# Patient Record
Sex: Female | Born: 1995 | Race: Black or African American | Hispanic: No | Marital: Single | Smoking: Never smoker
Health system: Southern US, Community
[De-identification: ages and names within clinical notes are randomized; demographics above are authoritative.]

## PROBLEM LIST (undated history)

## (undated) DIAGNOSIS — N921 Excessive and frequent menstruation with irregular cycle: Secondary | ICD-10-CM

## (undated) DIAGNOSIS — N83209 Unspecified ovarian cyst, unspecified side: Secondary | ICD-10-CM

## (undated) DIAGNOSIS — R0683 Snoring: Secondary | ICD-10-CM

## (undated) DIAGNOSIS — D509 Iron deficiency anemia, unspecified: Secondary | ICD-10-CM

## (undated) DIAGNOSIS — E669 Obesity, unspecified: Secondary | ICD-10-CM

## (undated) HISTORY — DX: Iron deficiency anemia, unspecified: D50.9

## (undated) HISTORY — DX: Unspecified ovarian cyst, unspecified side: N83.209

## (undated) HISTORY — PX: OTHER SURGICAL HISTORY: SHX169

## (undated) HISTORY — DX: Snoring: R06.83

---

## 2015-04-16 ENCOUNTER — Encounter: Payer: Self-pay | Admitting: Medical Oncology

## 2015-04-16 ENCOUNTER — Emergency Department
Admission: EM | Admit: 2015-04-16 | Discharge: 2015-04-16 | Disposition: A | Discharge status: Home / Self Care | Payer: Medicaid Other | Attending: Emergency Medicine | Admitting: Emergency Medicine

## 2015-04-16 ENCOUNTER — Emergency Department: Payer: Medicaid Other

## 2015-04-16 DIAGNOSIS — N939 Abnormal uterine and vaginal bleeding, unspecified: Secondary | ICD-10-CM | POA: Diagnosis not present

## 2015-04-16 DIAGNOSIS — Z3202 Encounter for pregnancy test, result negative: Secondary | ICD-10-CM | POA: Diagnosis not present

## 2015-04-16 DIAGNOSIS — R000 Tachycardia, unspecified: Secondary | ICD-10-CM | POA: Insufficient documentation

## 2015-04-16 LAB — BASIC METABOLIC PANEL
ANION GAP: 6 (ref 5–15)
BUN: 10 mg/dL (ref 6–20)
CALCIUM: 9 mg/dL (ref 8.9–10.3)
CHLORIDE: 105 mmol/L (ref 101–111)
CO2: 27 mmol/L (ref 22–32)
CREATININE: 0.84 mg/dL (ref 0.44–1.00)
GFR calc Af Amer: >60 mL/min (ref >60)
Glucose, Bld: 85 mg/dL (ref 65–99)
Potassium: 3.8 mmol/L (ref 3.5–5.1)
SODIUM: 138 mmol/L (ref 135–145)

## 2015-04-16 LAB — CBC WITH DIFFERENTIAL/PLATELET
BASOS ABS: 0 10*3/uL (ref 0–0.1)
Basophils Relative: 0 %
EOS PCT: 2 %
Eosinophils Absolute: 0.2 10*3/uL (ref 0–0.7)
HCT: 32.2 % — ABNORMAL LOW (ref 35.0–47.0)
Hemoglobin: 10.1 g/dL — ABNORMAL LOW (ref 12.0–16.0)
Lymphocytes Relative: 25 %
Lymphs Abs: 2.9 10*3/uL (ref 1.0–3.6)
MCH: 28.3 pg (ref 26.0–34.0)
MCHC: 31.5 g/dL — AB (ref 32.0–36.0)
MCV: 89.6 fL (ref 80.0–100.0)
MONOS PCT: 6 %
Monocytes Absolute: 0.7 10*3/uL (ref 0.2–0.9)
NEUTROS ABS: 7.5 10*3/uL — AB (ref 1.4–6.5)
NEUTROS PCT: 67 %
Platelets: 310 10*3/uL (ref 150–440)
RBC: 3.59 MIL/uL — ABNORMAL LOW (ref 3.80–5.20)
RDW: 17.3 % — ABNORMAL HIGH (ref 11.5–14.5)
WBC: 11.3 10*3/uL — ABNORMAL HIGH (ref 3.6–11.0)

## 2015-04-16 LAB — CBC
HCT: 31.3 % — ABNORMAL LOW (ref 35.0–47.0)
Hemoglobin: 10.4 g/dL — ABNORMAL LOW (ref 12.0–16.0)
MCH: 29.5 pg (ref 26.0–34.0)
MCHC: 33.3 g/dL (ref 32.0–36.0)
MCV: 88.6 fL (ref 80.0–100.0)
Platelets: 307 10*3/uL (ref 150–440)
RBC: 3.53 MIL/uL — AB (ref 3.80–5.20)
RDW: 17.1 % — AB (ref 11.5–14.5)
WBC: 9.4 10*3/uL (ref 3.6–11.0)

## 2015-04-16 LAB — CHLAMYDIA/NGC RT PCR (ARMC ONLY)
Chlamydia Tr: NOT DETECTED
N gonorrhoeae: NOT DETECTED

## 2015-04-16 LAB — POCT PREGNANCY, URINE: Preg Test, Ur: NEGATIVE

## 2015-04-16 LAB — WET PREP, GENITAL
CLUE CELLS WET PREP: NONE SEEN
TRICH WET PREP: NONE SEEN
WBC WET PREP: NONE SEEN
YEAST WET PREP: NONE SEEN

## 2015-04-16 MED ORDER — SODIUM CHLORIDE 0.9 % IV BOLUS (SEPSIS)
1000.0000 mL | Freq: Once | INTRAVENOUS | Status: AC
  Administered 2015-04-16: 1000 mL via INTRAVENOUS

## 2015-04-16 MED ORDER — NORETHINDRONE ACETATE 5 MG PO TABS: 10.0000 mg | ORAL_TABLET | Freq: Four times a day (QID) | ORAL | Status: DC

## 2015-04-16 MED ORDER — FERROUS FUMARATE 90 MG PO TABS: 90.0000 mg | ORAL_TABLET | Freq: Every day | ORAL | Status: DC

## 2015-04-16 NOTE — ED Notes (Signed)
Pt assisted into position to prep for pelvic exam. When patient pulled underwear down moderate amount of bleeding noted with pt blood dripping down patient's legs and onto the floor. Nursing staff assisted patient with getting cleaned up. MD notified.

## 2015-04-16 NOTE — ED Provider Notes (Signed)
[facility] Emergency Department Provider Note    ____________________________________________  Time seen: 1800  I have reviewed the triage vital signs and the nursing notes.   HISTORY  Chief Complaint Vaginal Bleeding   History limited by: Not Limited   HPI Julie Mcclain is a 19 y.o. female who presents to the emergency department today because of concerns for abnormal vaginal bleeding. The patient states that she has been bleeding for roughly 2 weeks. She states that normally she gets her period monthly in the last 4-5 days. She has never had problems with prolonged bleeding in the past. She states she has noticed that she has been bleeding some blood clots. Addition she has had some headache however denies any chest pain shortness of breath dizziness. She denies any history of bleeding problems in the past. She denies any pain. Denies any fevers.   History reviewed. No pertinent past medical history.  There are no active problems to display for this patient.   History reviewed. No pertinent past surgical history.  No current outpatient prescriptions on file.  Allergies Review of patient's allergies indicates no known allergies.  No family history on file.  Social History History  Substance Use Topics  . Smoking status: Never Smoker   . Smokeless tobacco: Not on file  . Alcohol Use: No    Review of Systems  Constitutional: Negative for fever. Cardiovascular: Negative for chest pain. Respiratory: Negative for shortness of breath. Gastrointestinal: Negative for abdominal pain, vomiting and diarrhea. Genitourinary: Negative for dysuria. Musculoskeletal: Negative for back pain. Skin: Negative for rash. Neurological: Negative for headaches, focal weakness or numbness.  10-point ROS otherwise negative.  ____________________________________________   PHYSICAL EXAM:  VITAL SIGNS: ED Triage Vitals  Enc Vitals Group     BP 04/16/15  1632 131/65 mmHg     Pulse Rate 04/16/15 1632 110     Resp 04/16/15 1632 20     Temp 04/16/15 1632 98.3 F (36.8 C)     Temp Source 04/16/15 1632 Oral     SpO2 04/16/15 1632 99 %     Weight 04/16/15 1632 250 lb (113.399 kg)     Height 04/16/15 1632 5\' 3"  (1.6 m)     Head Cir --      Peak Flow --      Pain Score --      Pain Loc --      Pain Edu? --      Excl. in GC? --      Constitutional: Alert and oriented. Well appearing and in no distress. Eyes: Conjunctivae are normal. PERRL. Normal extraocular movements. ENT   Head: Normocephalic and atraumatic.   Nose: No congestion/rhinnorhea.   Mouth/Throat: Mucous membranes are moist.   Neck: No stridor. Hematological/Lymphatic/Immunilogical: No cervical lymphadenopathy. Cardiovascular: Tachycardic., regular rhythm.  No murmurs, rubs, or gallops. Respiratory: Normal respiratory effort without tachypnea nor retractions. Breath sounds are clear and equal bilaterally. No wheezes/rales/rhonchi. Gastrointestinal: Soft and nontender. No distention.  Genitourinary: Multiple blood clots in vaginal vault. After evacuation of blood clots still some active bleeding noted. No CMT. No adnexal fullness or tenderness. Musculoskeletal: Normal range of motion in all extremities. No joint effusions.  No lower extremity tenderness nor edema. Neurologic:  Normal speech and language. No gross focal neurologic deficits are appreciated. Speech is normal.  Skin:  Skin is warm, dry and intact. No rash noted. Psychiatric: Mood and affect are normal. Speech and behavior are normal. Patient exhibits appropriate insight and judgment.  ____________________________________________  LABS (pertinent positives/negatives)  Labs Reviewed  CBC - Abnormal; Notable for the following:    RBC 3.53 (*)    Hemoglobin 10.4 (*)    HCT 31.3 (*)    RDW 17.1 (*)    All other components within normal limits  CBC WITH DIFFERENTIAL/PLATELET - Abnormal; Notable  for the following:    WBC 11.3 (*)    RBC 3.59 (*)    Hemoglobin 10.1 (*)    HCT 32.2 (*)    MCHC 31.5 (*)    RDW 17.3 (*)    Neutro Abs 7.5 (*)    All other components within normal limits  WET PREP, GENITAL  CHLAMYDIA/NGC RT PCR ([facility] ONLY)  BASIC METABOLIC PANEL  POC URINE PREG, ED  POCT PREGNANCY, URINE     ____________________________________________   EKG  None  ____________________________________________    RADIOLOGY  Transvaginal ultrasound  IMPRESSION: 1. Mildly heterogeneous appearance to the endometrial echo complex, of uncertain significance. No abnormal blood flow seen to suggest underlying polyp. This may remain within normal limits. If the patient's symptoms persist, further evaluation could be considered as deemed clinically appropriate. 2. 3.5 cm simple cyst at the left ovary, likely physiologic in nature. 3. Small amount of free fluid in the pelvic cul-de-sac, containing a small amount of mobile debris. 4. No evidence for ovarian torsion. ____________________________________________   PROCEDURES  Procedure(s) performed: None  Critical Care performed: No  ____________________________________________   INITIAL IMPRESSION / ASSESSMENT AND PLAN / ED COURSE  Pertinent labs & imaging results that were available during my care of the patient were reviewed by me and considered in my medical decision making (see chart for details).  Patient presents to the emergency department today with concerns for continued vaginal bleeding. It has been going on for 2 weeks. Ultrasound shows mildly heterogeneous appearance of the endometrium. Hemoglobin remained stable after 4 hour check. Patient's tachycardia did resolve with 500 cc bolus. I discussed with OB/GYN on call and will start patient on oral birth control. Additionally will start patient on iron tablets. Patient will follow up closely with  OB/GYN.  ____________________________________________   FINAL CLINICAL IMPRESSION(S) / ED DIAGNOSES  Final diagnoses:  Vaginal bleeding     Phineas Semen, MD 04/16/15 2259

## 2015-04-16 NOTE — ED Notes (Signed)
Pt in ultrasound

## 2015-04-16 NOTE — ED Notes (Signed)
Pt reports that she has been having vaginal bleeding x 2 weeks. Denies pain.

## 2015-04-16 NOTE — Discharge Instructions (Signed)
Please seek medical attention for any high fevers, chest pain, shortness of breath, change in behavior, persistent vomiting, bloody stool or any other new or concerning symptoms. ° °Abnormal Uterine Bleeding °Abnormal uterine bleeding can affect women at various stages in life, including teenagers, women in their reproductive years, pregnant women, and women who have reached menopause. Several kinds of uterine bleeding are considered abnormal, including: °· Bleeding or spotting between periods.   °· Bleeding after sexual intercourse.   °· Bleeding that is heavier or more than normal.   °· Periods that last longer than usual. °· Bleeding after menopause.   °Many cases of abnormal uterine bleeding are minor and simple to treat, while others are more serious. Any type of abnormal bleeding should be evaluated by your health care provider. Treatment will depend on the cause of the bleeding. °HOME CARE INSTRUCTIONS °Monitor your condition for any changes. The following actions may help to alleviate any discomfort you are experiencing: °· Avoid the use of tampons and douches as directed by your health care provider. °· Change your pads frequently. °You should get regular pelvic exams and Pap tests. Keep all follow-up appointments for diagnostic tests as directed by your health care provider.  °SEEK MEDICAL CARE IF:  °· Your bleeding lasts more than 1 week.   °· You feel dizzy at times.   °SEEK IMMEDIATE MEDICAL CARE IF:  °· You pass out.   °· You are changing pads every 15 to 30 minutes.   °· You have abdominal pain. °· You have a fever.   °· You become sweaty or weak.   °· You are passing large blood clots from the vagina.   °· You start to feel nauseous and vomit. °MAKE SURE YOU:  °· Understand these instructions. °· Will watch your condition. °· Will get help right away if you are not doing well or get worse. °Document Released: 08/30/2005 Document Revised: 09/04/2013 Document Reviewed: 03/29/2013 °[employer]® Patient  Information ©2015 [employer]. This information is not intended to replace advice given to you by your health care provider. Make sure you discuss any questions you have with your health care provider. ° °

## 2015-05-13 ENCOUNTER — Emergency Department
Admission: EM | Admit: 2015-05-13 | Discharge: 2015-05-13 | Disposition: A | Discharge status: Home / Self Care | Payer: Medicaid Other | Attending: Emergency Medicine | Admitting: Emergency Medicine

## 2015-05-13 ENCOUNTER — Encounter: Payer: Self-pay | Admitting: Emergency Medicine

## 2015-05-13 DIAGNOSIS — N939 Abnormal uterine and vaginal bleeding, unspecified: Secondary | ICD-10-CM | POA: Diagnosis present

## 2015-05-13 DIAGNOSIS — Z3202 Encounter for pregnancy test, result negative: Secondary | ICD-10-CM | POA: Diagnosis not present

## 2015-05-13 DIAGNOSIS — N921 Excessive and frequent menstruation with irregular cycle: Secondary | ICD-10-CM | POA: Insufficient documentation

## 2015-05-13 HISTORY — DX: Obesity, unspecified: E66.9

## 2015-05-13 HISTORY — DX: Excessive and frequent menstruation with irregular cycle: N92.1

## 2015-05-13 LAB — BASIC METABOLIC PANEL
ANION GAP: 6 (ref 5–15)
BUN: 13 mg/dL (ref 6–20)
CALCIUM: 9.3 mg/dL (ref 8.9–10.3)
CO2: 25 mmol/L (ref 22–32)
Chloride: 109 mmol/L (ref 101–111)
Creatinine, Ser: 0.93 mg/dL (ref 0.44–1.00)
GFR calc Af Amer: >60 mL/min (ref >60)
Glucose, Bld: 104 mg/dL — ABNORMAL HIGH (ref 65–99)
Potassium: 3.9 mmol/L (ref 3.5–5.1)
SODIUM: 140 mmol/L (ref 135–145)

## 2015-05-13 LAB — CBC
HCT: 30.9 % — ABNORMAL LOW (ref 35.0–47.0)
Hemoglobin: 9.6 g/dL — ABNORMAL LOW (ref 12.0–16.0)
MCH: 26.8 pg (ref 26.0–34.0)
MCHC: 31.1 g/dL — ABNORMAL LOW (ref 32.0–36.0)
MCV: 86.1 fL (ref 80.0–100.0)
PLATELETS: 393 10*3/uL (ref 150–440)
RBC: 3.58 MIL/uL — AB (ref 3.80–5.20)
RDW: 17.7 % — AB (ref 11.5–14.5)
WBC: 10.3 10*3/uL (ref 3.6–11.0)

## 2015-05-13 MED ORDER — NORGESTIMATE-ETH ESTRADIOL 0.25-35 MG-MCG PO TABS: 1.0000 | ORAL_TABLET | Freq: Every day | ORAL | Status: DC

## 2015-05-13 MED ORDER — FERROUS FUMARATE 90 MG PO TABS: 1.0000 | ORAL_TABLET | Freq: Every day | ORAL | Status: DC

## 2015-05-13 NOTE — ED Notes (Signed)
Patient to ED with report of being currently on her period however it seems heavier than normal.

## 2015-05-13 NOTE — Discharge Instructions (Signed)
As we discussed, although you are having heavy vaginal bleeding, it is not dangerous at this time.  The plan at this time is for you to start taking the birth control pills prescribed as written and to follow-up with the GYN doctor indicated in this document.  Please return to the emergency department if you develop any new or worsening symptoms that concern you.  Remember that tobacco use greatly increases her risk of developing blood clots while taking birth control pills and avoid smoking or using any other tobacco products.    Menorrhagia Menorrhagia is when your menstrual periods are heavy or last longer than usual.  HOME CARE  Only take medicine as told by your doctor.  Take any iron pills as told by your doctor. Heavy bleeding may cause low levels of iron in your body.  Do not take aspirin 1 week before or during your period. Aspirin can make the bleeding worse.  Lie down for a while if you change your tampon or pad more than once in 2 hours. This may help lessen the bleeding.  Eat a healthy diet and foods with iron. These foods include leafy green vegetables, meat, liver, eggs, and whole grain breads and cereals.  Do not try to lose weight. Wait until the heavy bleeding has stopped and your iron level is normal. GET HELP IF:  You soak through a pad or tampon every 1 or 2 hours, and this happens every time you have a period.  You need to use pads and tampons at the same time because you are bleeding so much.  You need to change your pad or tampon during the night.  You have a period that lasts for more than 8 days.  You pass clots bigger than 1 inch (2.5 cm) wide.  You have irregular periods that happen more or less often than once a month.  You feel dizzy or pass out (faint).  You feel very weak or tired.  You feel short of breath or feel your heart is beating too fast when you exercise.  You feel sick to your stomach (nausea) and you throw up (vomit) while you are  taking your medicine.   You have watery poop (diarrhea) while you are taking your medicine.  You have any problems that may be related to the medicine you are taking.  GET HELP RIGHT AWAY IF:  You soak through 4 or more pads or tampons in 2 hours.  You have any bleeding while you are pregnant. MAKE SURE YOU:   Understand these instructions.  Will watch your condition.  Will get help right away if you are not doing well or get worse. Document Released: 06/08/2008 Document Revised: 05/02/2013 Document Reviewed: 03/01/2013 [facility] Patient Information 2015 [place], [place]. This information is not intended to replace advice given to you by your health care provider. Make sure you discuss any questions you have with your health care provider.  Metrorrhagia Metrorrhagia is bleeding from your uterus. It happens at times when you are not expecting it, like between periods. The bleeding may also last a long time. HOME CARE   Take all medicine as told by your doctor. Do not change or switch medicines without talking to your doctor first.  Take iron pills (supplements) as told by your doctor. If you start to have trouble pooping (bowel movement), eat more fruits and vegetables.  Do not take aspirin before or during your period. Do not take medicines that have aspirin in them. Check the  label.  Rest as much as you can if you change a pad or tampon more than once in 2 hours.  Eat meals that include foods that have iron in them. These foods are:  Green leafy vegetables.  Red meat.  Liver.  Eggs.  Whole-grain breads and cereals.  Do not try to lose weight until your doctor says it is okay. GET HELP RIGHT AWAY IF:  You have a fever.  You have chills.  You feel lightheaded or pass out (faint).  You need to change your pad or tampon more than once in 1 hour.  Your bleeding is heavy.  You pass clumps of blood (clots) or tissue from your vagina.  You feel sick to your stomach  (nauseous) and throw up (vomit).  You cannot keep foods down.  You feel dizzy or have watery poop (diarrhea) while taking medicine.  You have problems that you think are caused by your medicines. MAKE SURE YOU:   Understand these instructions.  Will watch your condition.  Will get help right away if you are not doing well or get worse. Document Released: 11/22/2011 Document Reviewed: 11/22/2011 [facility] Patient Information 2015 [place], [place]. This information is not intended to replace advice given to you by your health care provider. Make sure you discuss any questions you have with your health care provider.

## 2015-05-13 NOTE — ED Notes (Signed)
While assessing pt she states the only reason that she came back to ed was because she did not get her RX for birth control the last time she was here.  I printed out after visit summary for pt from last visit.  Pt states she did not follow up with Dr. Vergie Living because she lives in [place] and has only recently moved here.  Advised Dr. Vergie Living is here in [place] and she needs to establish OBGYN care with him. Pt verbalized understanding. Pt states she has had vaginal bleeding x 3 episodes this month and needs the RX of birth control to make her stop bleeding.  Family with pt.  abd without distention and bowel sounds x 4 quads. Pt appears in no acute distress at this time.

## 2015-05-13 NOTE — ED Provider Notes (Signed)
[facility] Emergency Department Provider Note  ____________________________________________  Time seen: Approximately 6:57 PM  I have reviewed the triage vital signs and the nursing notes.   HISTORY  Chief Complaint Vaginal Bleeding    HPI Julie Mcclain is a 19 y.o. female with a history of menometrorrhagia he was previously prescribed for control pills to help with this issue and who was going to follow up with [facility] who presents with several days of intermittent spotting and irregular period over the last 3 weeks. She is having no abdominal pain, fever, chills, chest pain, shortness of breath, vaginal discharge, dysuria.  She states that she is here because she never had the birth control pills filled last time reportedly due to some problem at the pharmacy.  She also did not follow up with Dr. Vergie Living.  She would like to do both now.  Amount of bleeding she has had has been minimal, and she has had no lightheadedness or dizziness.  She states that she did take her iron pills, but she is out of those as well.  Past Medical History  Diagnosis Date  . Obesity   . Menometrorrhagia     There are no active problems to display for this patient.   History reviewed. No pertinent past surgical history.  Current Outpatient Rx  Name  Route  Sig  Dispense  Refill  . Ferrous Fumarate 90 MG TABS   Oral   Take 1 tablet (90 mg total) by mouth daily.   90 each   0   .             Allergies Review of patient's allergies indicates no known allergies.  History reviewed. No pertinent family history.  Social History Social History  Substance Use Topics  . Smoking status: Never Smoker   . Smokeless tobacco: None  . Alcohol Use: No    Review of Systems Constitutional: No fever/chills Eyes: No visual changes. ENT: No sore throat. Cardiovascular: Denies chest pain. Respiratory: Denies shortness of breath. Gastrointestinal: No abdominal pain.  No  nausea, no vomiting.  No diarrhea.  No constipation. Genitourinary: Negative for dysuria.  Intermittent vaginal bleeding for several weeks. Musculoskeletal: Negative for back pain. Skin: Negative for rash. Neurological: Negative for headaches, focal weakness or numbness.  10-point ROS otherwise negative.  ____________________________________________   PHYSICAL EXAM:  VITAL SIGNS: ED Triage Vitals  Enc Vitals Group     BP 05/13/15 1646 115/98 mmHg     Pulse Rate 05/13/15 1646 115     Resp 05/13/15 1646 20     Temp 05/13/15 1646 98.2 F (36.8 C)     Temp Source 05/13/15 1646 Oral     SpO2 05/13/15 1646 100 %     Weight 05/13/15 1646 256 lb (116.121 kg)     Height 05/13/15 1646 5\' 3"  (1.6 m)     Head Cir --      Peak Flow --      Pain Score --      Pain Loc --      Pain Edu? --      Excl. in GC? --     Constitutional: Alert and oriented. Well appearing and in no acute distress. Eyes: Conjunctivae are normal. PERRL. EOMI. Head: Atraumatic. Nose: No congestion/rhinnorhea. Mouth/Throat: Mucous membranes are moist.  Oropharynx non-erythematous. Neck: No stridor.   Cardiovascular: Normal rate, regular rhythm. Grossly normal heart sounds.  Good peripheral circulation. Respiratory: Normal respiratory effort.  No retractions. Lungs CTAB. Gastrointestinal:  Soft and nontender. No distention. No abdominal bruits. No CVA tenderness. Genitourinary: Deferred per patient preference Musculoskeletal: No lower extremity tenderness nor edema.  No joint effusions. Neurologic:  Normal speech and language. No gross focal neurologic deficits are appreciated.  Skin:  Skin is warm, dry and intact. No rash noted. Psychiatric: Mood and affect are normal. Speech and behavior are normal.  ____________________________________________   LABS (all labs ordered are listed, but only abnormal results are displayed)  Labs Reviewed  CBC - Abnormal; Notable for the following:    RBC 3.58 (*)     Hemoglobin 9.6 (*)    HCT 30.9 (*)    MCHC 31.1 (*)    RDW 17.7 (*)    All other components within normal limits  BASIC METABOLIC PANEL - Abnormal; Notable for the following:    Glucose, Bld 104 (*)    All other components within normal limits   ____________________________________________  EKG  Not indicated ____________________________________________  RADIOLOGY   No results found.  ____________________________________________   PROCEDURES  Procedure(s) performed: None  Critical Care performed: No ____________________________________________   INITIAL IMPRESSION / ASSESSMENT AND PLAN / ED COURSE  Pertinent labs & imaging results that were available during my care of the patient were reviewed by me and considered in my medical decision making (see chart for details).  The patient is well-appearing and in no acute distress.  She states that all she wants is some birth control pills and to follow up with OB.  I explained that she does not need any recommendation for me to do so but I provided her with Dr. Vergie Living name and contact information at [facility].  She is mildly tachycardic but she was also tachycardic at her last visit and she is in no distress with no other symptoms of volume depletion.  She is mildly anemic but similar to what she has been in the past.  I offered to do a pelvic exam but she does not want to do that tonight because "I know what is going on".  I gave her prescription for Sprintec and for iron supplements and advised close follow-up tomorrow.  The patient agrees with the plan.  ____________________________________________  FINAL CLINICAL IMPRESSION(S) / ED DIAGNOSES  Final diagnoses:  Menometrorrhagia      NEW MEDICATIONS STARTED DURING THIS VISIT:  Discharge Medication List as of 05/13/2015  7:13 PM    START taking these medications   Details  norgestimate-ethinyl estradiol (ORTHO-CYCLEN,SPRINTEC,PREVIFEM) 0.25-35 MG-MCG tablet Take 1  tablet by mouth daily., Starting 05/13/2015, Until Discontinued, Print         Loleta Rose, MD 05/13/15 [phone]

## 2015-05-14 ENCOUNTER — Telehealth: Payer: Self-pay | Admitting: Emergency Medicine

## 2015-05-14 NOTE — ED Notes (Signed)
[facility] pharmacy called to clarify ferrous fumarate rx.  They do not have 90 mg dose.  Per dr Carollee Massed can change to 106 mg.

## 2015-05-17 LAB — POCT PREGNANCY, URINE: PREG TEST UR: NEGATIVE

## 2015-10-05 ENCOUNTER — Emergency Department
Admission: EM | Admit: 2015-10-05 | Discharge: 2015-10-05 | Disposition: A | Discharge status: Home / Self Care | Payer: Medicaid Other | Attending: Emergency Medicine | Admitting: Emergency Medicine

## 2015-10-05 DIAGNOSIS — Z79899 Other long term (current) drug therapy: Secondary | ICD-10-CM | POA: Insufficient documentation

## 2015-10-05 DIAGNOSIS — K2970 Gastritis, unspecified, without bleeding: Secondary | ICD-10-CM | POA: Diagnosis not present

## 2015-10-05 DIAGNOSIS — R1013 Epigastric pain: Secondary | ICD-10-CM | POA: Diagnosis present

## 2015-10-05 MED ORDER — GI COCKTAIL ~~LOC~~
30.0000 mL | Freq: Once | ORAL | Status: AC
  Administered 2015-10-05: 30 mL via ORAL
  Filled 2015-10-05: qty 30

## 2015-10-05 MED ORDER — SODIUM CHLORIDE 0.9 % IV SOLN: 1000.0000 mL | Freq: Once | INTRAVENOUS | Status: DC

## 2015-10-05 NOTE — ED Notes (Signed)
Pt verbalized understanding of discharge instructions. NAD at this time. 

## 2015-10-05 NOTE — ED Notes (Signed)
Pt reports epigastric pain that started this morning. Describes the pain as sharp that comes and goes. Denies vomiting or Diarrhea.

## 2015-10-05 NOTE — Discharge Instructions (Signed)

## 2015-10-05 NOTE — ED Provider Notes (Signed)
[facility] Emergency Department Provider Note  ____________________________________________  Time seen: On arrival  I have reviewed the triage vital signs and the nursing notes.   HISTORY  Chief Complaint Abdominal Pain    HPI Julie Mcclain is a 20 y.o. female who reports that when she woke up this morning she had epigastric pain that was sharp and moderate in nature. She told her mother about this who sent her to the hospital to get checked out. She did not take anything for it. She denies nausea or vomiting. She reports she is ready starting to  feel better in the emergency department. She has no chest pain. No shortness of breath. No back pain. She has no lower abdominal pain. She does not drink alcohol     Past Medical History  Diagnosis Date  . Obesity   . Menometrorrhagia     There are no active problems to display for this patient.   No past surgical history on file.  Current Outpatient Rx  Name  Route  Sig  Dispense  Refill  . Lisdexamfetamine Dimesylate (VYVANSE) 10 MG CAPS   Oral   Take 10 mg by mouth daily.         . Ferrous Fumarate 90 MG TABS   Oral   Take 1 tablet (90 mg total) by mouth daily.   90 each   0   . norgestimate-ethinyl estradiol (ORTHO-CYCLEN,SPRINTEC,PREVIFEM) 0.25-35 MG-MCG tablet   Oral   Take 1 tablet by mouth daily.   1 Package   2     Allergies Review of patient's allergies indicates no known allergies.  No family history on file.  Social History Social History  Substance Use Topics  . Smoking status: Never Smoker   . Smokeless tobacco: Not on file  . Alcohol Use: No    Review of Systems  Constitutional: Negative for fever.  ENT: Negative for sore throat Cardiovascular: Negative for chest pain. Negative for Palpitations Respiratory: Negative for shortness of breath. Gastrointestinal: As above Genitourinary: Negative for dysuria. Musculoskeletal: Negative for back pain. Skin: Negative  for rash. Neurological: Negative for dizziness Psychiatric: No anxiety    ____________________________________________   PHYSICAL EXAM:  VITAL SIGNS: ED Triage Vitals  Enc Vitals Group     BP 10/05/15 0812 118/64 mmHg     Pulse Rate 10/05/15 0812 117     Resp 10/05/15 0812 97     Temp 10/05/15 0812 97.7 F (36.5 C)     Temp Source 10/05/15 0812 Oral     SpO2 10/05/15 0812 97 %     Weight 10/05/15 0812 250 lb (113.399 kg)     Height 10/05/15 0812  (1.651 m)     Head Cir --      Peak Flow --      Pain Score 10/05/15 0815 7     Pain Loc --      Pain Edu? --      Excl. in GC? --      Constitutional: Alert and oriented. Well appearing and in no distress. Eyes: Conjunctivae are normal.  ENT   Head: Normocephalic and atraumatic.   Mouth/Throat: Mucous membranes are moist. Cardiovascular: Normal rate, regular rhythm. Normal and symmetric distal pulses are present in all extremities. No murmurs, rubs, or gallops. Respiratory: Normal respiratory effort without tachypnea nor retractions. Breath sounds are clear and equal bilaterally.  Gastrointestinal: Soft and non-tender in all quadrants. No distention.  Genitourinary: deferred Musculoskeletal: Nontender with normal range of motion  in all extremities. No lower extremity tenderness nor edema. Neurologic:  Normal speech and language. No gross focal neurologic deficits are appreciated. Skin:  Skin is warm, dry and intact. No rash noted. Psychiatric: Mood and affect are normal. Patient exhibits appropriate insight and judgment.  ____________________________________________    LABS (pertinent positives/negatives)  Labs Reviewed - No data to display  ____________________________________________   EKG  ED ECG REPORT I, [name], the attending physician, personally viewed and interpreted this ECG.  Date: 10/05/2015 EKG Time: 8:21 AM Rate: 111 Rhythm: Sinus tachycardia QRS Axis: normal Intervals:  normal ST/T Wave abnormalities: normal Conduction Disutrbances: none Narrative Interpretation: unremarkable   ____________________________________________    RADIOLOGY I have personally reviewed any xrays that were ordered on this patient: None  ____________________________________________   PROCEDURES  Procedure(s) performed: none  Critical Care performed: none  ____________________________________________   INITIAL IMPRESSION / ASSESSMENT AND PLAN / ED COURSE  Pertinent labs & imaging results that were available during my care of the patient were reviewed by me and considered in my medical decision making (see chart for details).  Patient very well-appearing with benign exam. She reports her symptoms have already almost abated. We will give a GI cocktail for suspected gastritis. She has no tenderness to palpation of her abdomen. History of present illness and exam not consistent with pancreatitis nor cholecystitis. No chest pain or shortness of breath  GI cocktail completely resolved her discomfort  ----------------------------------------- 10:05 AM on 10/05/2015 -----------------------------------------  Patient's heart rate seems to increase significantly when I entered the room. I placed her on the monitor and left her alone and watched her heart rate improve to 95 bpm, hence I feel she is appropriate for discharge  ____________________________________________   FINAL CLINICAL IMPRESSION(S) / ED DIAGNOSES  Final diagnoses:  Gastritis     [name], MD 10/05/15 1005

## 2015-10-12 ENCOUNTER — Emergency Department
Admission: EM | Admit: 2015-10-12 | Discharge: 2015-10-12 | Disposition: A | Discharge status: Home / Self Care | Payer: Medicaid Other | Attending: Emergency Medicine | Admitting: Emergency Medicine

## 2015-10-12 ENCOUNTER — Emergency Department: Payer: Medicaid Other

## 2015-10-12 DIAGNOSIS — Z79899 Other long term (current) drug therapy: Secondary | ICD-10-CM | POA: Insufficient documentation

## 2015-10-12 DIAGNOSIS — Z3202 Encounter for pregnancy test, result negative: Secondary | ICD-10-CM | POA: Insufficient documentation

## 2015-10-12 DIAGNOSIS — R000 Tachycardia, unspecified: Secondary | ICD-10-CM | POA: Diagnosis not present

## 2015-10-12 DIAGNOSIS — K2970 Gastritis, unspecified, without bleeding: Secondary | ICD-10-CM | POA: Diagnosis not present

## 2015-10-12 DIAGNOSIS — R1012 Left upper quadrant pain: Secondary | ICD-10-CM | POA: Diagnosis present

## 2015-10-12 LAB — URINALYSIS COMPLETE WITH MICROSCOPIC (ARMC ONLY)
Bilirubin Urine: NEGATIVE
Glucose, UA: NEGATIVE mg/dL
HGB URINE DIPSTICK: NEGATIVE
Ketones, ur: NEGATIVE mg/dL
NITRITE: NEGATIVE
PH: 6 (ref 5.0–8.0)
PROTEIN: NEGATIVE mg/dL
SPECIFIC GRAVITY, URINE: 1.021 (ref 1.005–1.030)

## 2015-10-12 LAB — COMPREHENSIVE METABOLIC PANEL
ALT: 17 U/L (ref 14–54)
ANION GAP: 5 (ref 5–15)
AST: 17 U/L (ref 15–41)
Albumin: 4.1 g/dL (ref 3.5–5.0)
Alkaline Phosphatase: 88 U/L (ref 38–126)
BILIRUBIN TOTAL: 0.5 mg/dL (ref 0.3–1.2)
BUN: 12 mg/dL (ref 6–20)
CALCIUM: 9.2 mg/dL (ref 8.9–10.3)
CO2: 26 mmol/L (ref 22–32)
Chloride: 106 mmol/L (ref 101–111)
Creatinine, Ser: 0.78 mg/dL (ref 0.44–1.00)
GFR calc Af Amer: >60 mL/min (ref >60)
GLUCOSE: 79 mg/dL (ref 65–99)
Potassium: 3.8 mmol/L (ref 3.5–5.1)
Sodium: 137 mmol/L (ref 135–145)
Total Protein: 8.2 g/dL — ABNORMAL HIGH (ref 6.5–8.1)

## 2015-10-12 LAB — FIBRIN DERIVATIVES D-DIMER (ARMC ONLY): Fibrin derivatives D-dimer (ARMC): 237 (ref 0–499)

## 2015-10-12 LAB — POCT PREGNANCY, URINE: PREG TEST UR: NEGATIVE

## 2015-10-12 LAB — CBC WITH DIFFERENTIAL/PLATELET
BASOS PCT: 0 %
Basophils Absolute: 0 10*3/uL (ref 0–0.1)
EOS PCT: 1 %
Eosinophils Absolute: 0.1 10*3/uL (ref 0–0.7)
HEMATOCRIT: 32.9 % — AB (ref 35.0–47.0)
Hemoglobin: 10.2 g/dL — ABNORMAL LOW (ref 12.0–16.0)
LYMPHS PCT: 20 %
Lymphs Abs: 2.1 10*3/uL (ref 1.0–3.6)
MCH: 23.4 pg — ABNORMAL LOW (ref 26.0–34.0)
MCHC: 31 g/dL — AB (ref 32.0–36.0)
MCV: 75.5 fL — AB (ref 80.0–100.0)
MONO ABS: 0.6 10*3/uL (ref 0.2–0.9)
MONOS PCT: 6 %
NEUTROS ABS: 7.8 10*3/uL — AB (ref 1.4–6.5)
Neutrophils Relative %: 73 %
PLATELETS: 407 10*3/uL (ref 150–440)
RBC: 4.35 MIL/uL (ref 3.80–5.20)
RDW: 19.6 % — AB (ref 11.5–14.5)
WBC: 10.7 10*3/uL (ref 3.6–11.0)

## 2015-10-12 LAB — TROPONIN I

## 2015-10-12 LAB — LIPASE, BLOOD: Lipase: 18 U/L (ref 11–51)

## 2015-10-12 MED ORDER — SODIUM CHLORIDE 0.9 % IV BOLUS (SEPSIS)
1000.0000 mL | Freq: Once | INTRAVENOUS | Status: AC
  Administered 2015-10-12: 1000 mL via INTRAVENOUS

## 2015-10-12 MED ORDER — FAMOTIDINE IN NACL 20-0.9 MG/50ML-% IV SOLN
20.0000 mg | Freq: Once | INTRAVENOUS | Status: AC
  Administered 2015-10-12: 20 mg via INTRAVENOUS
  Filled 2015-10-12: qty 50

## 2015-10-12 MED ORDER — GI COCKTAIL ~~LOC~~
ORAL | Status: AC
  Administered 2015-10-12: 30 mL via ORAL
  Filled 2015-10-12: qty 30

## 2015-10-12 MED ORDER — GI COCKTAIL ~~LOC~~
30.0000 mL | Freq: Once | ORAL | Status: AC
  Administered 2015-10-12: 30 mL via ORAL

## 2015-10-12 MED ORDER — RANITIDINE HCL 75 MG PO TABS: 75.0000 mg | ORAL_TABLET | Freq: Two times a day (BID) | ORAL | Status: DC

## 2015-10-12 NOTE — ED Notes (Signed)
Patient transported to X-ray 

## 2015-10-12 NOTE — ED Provider Notes (Signed)
[facility] Emergency Department Provider Note  ____________________________________________  Time seen: Approximately 8:45 AM  I have reviewed the triage vital signs and the nursing notes.   HISTORY  Chief Complaint Abdominal Pain    HPI Julie Mcclain is a 20 y.o. female with a family history of reflux who is presenting today with left upper quadrant abdominal pain. She was seen here one week ago and the pain was relieved with a GI cocktail. However, she says that she has not been taking any medication at home to keep the pain away. She says that the pain worsens when she lays back and especially at night. She describes the pain as a pressure-like pain. There is no pain with deep breathing. There is no chest pain or shortness of breath. She says that she last took her Vyvanse this morning. Also takes birth control.She says that she came back today because she has not been able to sleep.   Past Medical History  Diagnosis Date  . Obesity   . Menometrorrhagia     There are no active problems to display for this patient.   No past surgical history on file.  Current Outpatient Rx  Name  Route  Sig  Dispense  Refill  . Ferrous Fumarate 90 MG TABS   Oral   Take 1 tablet (90 mg total) by mouth daily.   90 each   0   . Lisdexamfetamine Dimesylate (VYVANSE) 10 MG CAPS   Oral   Take 10 mg by mouth daily.         . norgestimate-ethinyl estradiol (ORTHO-CYCLEN,SPRINTEC,PREVIFEM) 0.25-35 MG-MCG tablet   Oral   Take 1 tablet by mouth daily.   1 Package   2     Allergies Review of patient's allergies indicates no known allergies.  No family history on file.  Social History Social History  Substance Use Topics  . Smoking status: Never Smoker   . Smokeless tobacco: Not on file  . Alcohol Use: No    Review of Systems Constitutional: No fever/chills Eyes: No visual changes. ENT: No sore throat. Cardiovascular: Denies chest pain. Respiratory:  Denies shortness of breath. Gastrointestinal: No nausea, no vomiting.  No diarrhea.  No constipation. Genitourinary: Negative for dysuria. Musculoskeletal: Negative for back pain. Skin: Negative for rash. Neurological: Negative for headaches, focal weakness or numbness.  10-point ROS otherwise negative.  ____________________________________________   PHYSICAL EXAM:  VITAL SIGNS: ED Triage Vitals  Enc Vitals Group     BP 10/12/15 0833 144/66 mmHg     Pulse Rate 10/12/15 0833 110     Resp 10/12/15 0833 18     Temp 10/12/15 0833 98.1 F (36.7 C)     Temp Source 10/12/15 0833 Oral     SpO2 10/12/15 0833 98 %     Weight 10/12/15 0833 250 lb (113.399 kg)     Height 10/12/15 0833  (1.651 m)     Head Cir --      Peak Flow --      Pain Score 10/12/15 0834 7     Pain Loc --      Pain Edu? --      Excl. in GC? --     Constitutional: Alert and oriented. Well appearing and in no acute distress. Eyes: Conjunctivae are normal. PERRL. EOMI. Head: Atraumatic. Nose: No congestion/rhinnorhea. Mouth/Throat: Mucous membranes are moist.  Oropharynx non-erythematous. Neck: No stridor.   Cardiovascular: Tachycardic, regular rhythm. Grossly normal heart sounds.  Good peripheral circulation. Respiratory:  Normal respiratory effort.  No retractions. Lungs CTAB. Gastrointestinal: Soft with mild to moderate left upper quadrant abdominal pain. No distention. No CVA tenderness. Musculoskeletal: No lower extremity tenderness nor edema.  No joint effusions. Neurologic:  Normal speech and language. No gross focal neurologic deficits are appreciated. No gait instability. Skin:  Skin is warm, dry and intact. No rash noted. Psychiatric: Mood and affect are normal. Speech and behavior are normal.  ____________________________________________   LABS (all labs ordered are listed, but only abnormal results are displayed)  Labs Reviewed  CBC WITH DIFFERENTIAL/PLATELET - Abnormal; Notable for the  following:    Hemoglobin 10.2 (*)    HCT 32.9 (*)    MCV 75.5 (*)    MCH 23.4 (*)    MCHC 31.0 (*)    RDW 19.6 (*)    Neutro Abs 7.8 (*)    All other components within normal limits  COMPREHENSIVE METABOLIC PANEL - Abnormal; Notable for the following:    Total Protein 8.2 (*)    All other components within normal limits  URINALYSIS COMPLETEWITH MICROSCOPIC ([facility] ONLY) - Abnormal; Notable for the following:    Color, Urine YELLOW (*)    APPearance HAZY (*)    Leukocytes, UA 3+ (*)    Bacteria, UA RARE (*)    Squamous Epithelial / LPF 6-30 (*)    All other components within normal limits  URINE CULTURE  LIPASE, BLOOD  TROPONIN I  FIBRIN DERIVATIVES D-DIMER ([facility] ONLY)  POC URINE PREG, ED  POCT PREGNANCY, URINE   ____________________________________________  EKG  ED ECG REPORT I, Haylie Mccutcheon,  Teena Irani, the attending physician, personally viewed and interpreted this ECG.   Date: 10/12/2015  EKG Time: 11 AM  Rate: 96  Rhythm: normal sinus rhythm  Axis: Normal axis  Intervals:none  ST&T Change: No ST segment elevation or depression. No abnormal T-wave inversion.  ____________________________________________  RADIOLOGY  No active cardiopulmonary disease on the chest x-ray. ____________________________________________   PROCEDURES  ____________________________________________   INITIAL IMPRESSION / ASSESSMENT AND PLAN / ED COURSE  Pertinent labs & imaging results that were available during my care of the patient were reviewed by me and considered in my medical decision making (see chart for details).  ----------------------------------------- 9:19 AM on 10/12/2015 -----------------------------------------  Patient says only minimal relief with the GI cocktail. Also still tachycardic to 106-110. We'll proceed with further workup.  ----------------------------------------- 11:31 AM on 10/12/2015 -----------------------------------------  Patient denying any  pain at this time. Resting comfortably and heart rate is now 90-95. Likely reflux causing her symptoms. Very reassuring cardiac workup. D-dimer negative. 3+ leukocytes on her urine but has squamous epithelial cells are 20 which could represent contamination. She denies any urinary frequency or burning or any changes in her urine routine. We'll send culture but not treat for urinary tract infection at this time. ____________________________________________   FINAL CLINICAL IMPRESSION(S) / ED DIAGNOSES  Gastritis.    Myrna Blazer, MD 10/12/15 [phone]

## 2015-10-12 NOTE — ED Notes (Signed)
Pt reports for past 2 weeks upper abd pain. Denies vomiting or diarrhea or fever.Julie Mcclain

## 2015-10-12 NOTE — Discharge Instructions (Signed)

## 2015-10-13 LAB — URINE CULTURE

## 2015-11-10 ENCOUNTER — Emergency Department
Admission: EM | Admit: 2015-11-10 | Discharge: 2015-11-10 | Disposition: A | Discharge status: Home / Self Care | Payer: Medicaid Other | Attending: Emergency Medicine | Admitting: Emergency Medicine

## 2015-11-10 ENCOUNTER — Encounter: Payer: Self-pay | Admitting: Emergency Medicine

## 2015-11-10 DIAGNOSIS — Z79899 Other long term (current) drug therapy: Secondary | ICD-10-CM | POA: Diagnosis not present

## 2015-11-10 DIAGNOSIS — J069 Acute upper respiratory infection, unspecified: Secondary | ICD-10-CM | POA: Diagnosis not present

## 2015-11-10 DIAGNOSIS — R0981 Nasal congestion: Secondary | ICD-10-CM | POA: Diagnosis present

## 2015-11-10 DIAGNOSIS — Z3202 Encounter for pregnancy test, result negative: Secondary | ICD-10-CM | POA: Diagnosis not present

## 2015-11-10 LAB — POCT PREGNANCY, URINE: Preg Test, Ur: NEGATIVE

## 2015-11-10 LAB — URINALYSIS COMPLETE WITH MICROSCOPIC (ARMC ONLY)
Bacteria, UA: NONE SEEN
Bilirubin Urine: NEGATIVE
Glucose, UA: NEGATIVE mg/dL
HGB URINE DIPSTICK: NEGATIVE
KETONES UR: NEGATIVE mg/dL
Nitrite: NEGATIVE
PH: 5 (ref 5.0–8.0)
PROTEIN: NEGATIVE mg/dL
SPECIFIC GRAVITY, URINE: 1.028 (ref 1.005–1.030)

## 2015-11-10 LAB — COMPREHENSIVE METABOLIC PANEL
ALK PHOS: 91 U/L (ref 38–126)
ALT: 18 U/L (ref 14–54)
AST: 24 U/L (ref 15–41)
Albumin: 4 g/dL (ref 3.5–5.0)
Anion gap: 6 (ref 5–15)
BUN: 9 mg/dL (ref 6–20)
CALCIUM: 9.3 mg/dL (ref 8.9–10.3)
CO2: 24 mmol/L (ref 22–32)
CREATININE: 0.86 mg/dL (ref 0.44–1.00)
Chloride: 110 mmol/L (ref 101–111)
Glucose, Bld: 122 mg/dL — ABNORMAL HIGH (ref 65–99)
Potassium: 3.5 mmol/L (ref 3.5–5.1)
Sodium: 140 mmol/L (ref 135–145)
Total Protein: 7.6 g/dL (ref 6.5–8.1)

## 2015-11-10 LAB — CBC
HCT: 33.5 % — ABNORMAL LOW (ref 35.0–47.0)
Hemoglobin: 10.5 g/dL — ABNORMAL LOW (ref 12.0–16.0)
MCH: 24.3 pg — ABNORMAL LOW (ref 26.0–34.0)
MCHC: 31.3 g/dL — ABNORMAL LOW (ref 32.0–36.0)
MCV: 77.8 fL — ABNORMAL LOW (ref 80.0–100.0)
PLATELETS: 362 10*3/uL (ref 150–440)
RBC: 4.31 MIL/uL (ref 3.80–5.20)
RDW: 18.8 % — AB (ref 11.5–14.5)
WBC: 9.4 10*3/uL (ref 3.6–11.0)

## 2015-11-10 LAB — LIPASE, BLOOD: Lipase: 19 U/L (ref 11–51)

## 2015-11-10 NOTE — Discharge Instructions (Signed)
Upper Respiratory Infection, Adult Most upper respiratory infections (URIs) are a viral infection of the air passages leading to the lungs. A URI affects the nose, throat, and upper air passages. The most common type of URI is nasopharyngitis and is typically referred to as "the common cold." URIs run their course and usually go away on their own. Most of the time, a URI does not require medical attention, but sometimes a bacterial infection in the upper airways can follow a viral infection. This is called a secondary infection. Sinus and middle ear infections are common types of secondary upper respiratory infections. Bacterial pneumonia can also complicate a URI. A URI can worsen asthma and chronic obstructive pulmonary disease (COPD). Sometimes, these complications can require emergency medical care and may be life threatening.  CAUSES Almost all URIs are caused by viruses. A virus is a type of germ and can spread from one person to another.  RISKS FACTORS You may be at risk for a URI if:   You smoke.   You have chronic heart or lung disease.  You have a weakened defense (immune) system.   You are very young or very old.   You have nasal allergies or asthma.  You work in crowded or poorly ventilated areas.  You work in health care facilities or schools. SIGNS AND SYMPTOMS  Symptoms typically develop 2-3 days after you come in contact with a cold virus. Most viral URIs last 7-10 days. However, viral URIs from the influenza virus (flu virus) can last 14-18 days and are typically more severe. Symptoms may include:   Runny or stuffy (congested) nose.   Sneezing.   Cough.   Sore throat.   Headache.   Fatigue.   Fever.   Loss of appetite.   Pain in your forehead, behind your eyes, and over your cheekbones (sinus pain).  Muscle aches.  DIAGNOSIS  Your health care provider may diagnose a URI by:  Physical exam.  Tests to check that your symptoms are not due to  another condition such as:  Strep throat.  Sinusitis.  Pneumonia.  Asthma. TREATMENT  A URI goes away on its own with time. It cannot be cured with medicines, but medicines may be prescribed or recommended to relieve symptoms. Medicines may help:  Reduce your fever.  Reduce your cough.  Relieve nasal congestion. HOME CARE INSTRUCTIONS   Take medicines only as directed by your health care provider.   Gargle warm saltwater or take cough drops to comfort your throat as directed by your health care provider.  Use a warm mist humidifier or inhale steam from a shower to increase air moisture. This may make it easier to breathe.  Drink enough fluid to keep your urine clear or pale yellow.   Eat soups and other clear broths and maintain good nutrition.   Rest as needed.   Return to work when your temperature has returned to normal or as your health care provider advises. You may need to stay home longer to avoid infecting others. You can also use a face mask and careful hand washing to prevent spread of the virus.  Increase the usage of your inhaler if you have asthma.   Do not use any tobacco products, including cigarettes, chewing tobacco, or electronic cigarettes. If you need help quitting, ask your health care provider. PREVENTION  The best way to protect yourself from getting a cold is to practice good hygiene.   Avoid oral or hand contact with people with cold   symptoms.   Wash your hands often if contact occurs.  There is no clear evidence that vitamin C, vitamin E, echinacea, or exercise reduces the chance of developing a cold. However, it is always recommended to get plenty of rest, exercise, and practice good nutrition.  SEEK MEDICAL CARE IF:   You are getting worse rather than better.   Your symptoms are not controlled by medicine.   You have chills.  You have worsening shortness of breath.  You have brown or red mucus.  You have yellow or brown nasal  discharge.  You have pain in your face, especially when you bend forward.  You have a fever.  You have swollen neck glands.  You have pain while swallowing.  You have white areas in the back of your throat. SEEK IMMEDIATE MEDICAL CARE IF:   You have severe or persistent:  Headache.  Ear pain.  Sinus pain.  Chest pain.  You have chronic lung disease and any of the following:  Wheezing.  Prolonged cough.  Coughing up blood.  A change in your usual mucus.  You have a stiff neck.  You have changes in your:  Vision.  Hearing.  Thinking.  Mood. MAKE SURE YOU:   Understand these instructions.  Will watch your condition.  Will get help right away if you are not doing well or get worse.   This information is not intended to replace advice given to you by your health care provider. Make sure you discuss any questions you have with your health care provider.   Document Released: 02/23/2001 Document Revised: 01/14/2015 Document Reviewed: 12/05/2013 Elsevier Interactive Patient Education 2016 [employer]  

## 2015-11-10 NOTE — ED Notes (Signed)
Pregnancy POCT negative

## 2015-11-10 NOTE — ED Provider Notes (Signed)
[facility] Emergency Department Provider Note  ____________________________________________  Time seen: On arrival  I have reviewed the triage vital signs and the nursing notes.   HISTORY  Chief Complaint Nasal congestion  HPI Donnalee Teehan is a 20 y.o. female who presents with nasal congestion. Patient reports that she has been congested for the last 2 days. She reports she threw up this morning because she gagged on "snot". She denies fevers or chills. No cough. No shortness of breath. Denies sick contacts.    Past Medical History  Diagnosis Date  . Obesity   . Menometrorrhagia     There are no active problems to display for this patient.   History reviewed. No pertinent past surgical history.  Current Outpatient Rx  Name  Route  Sig  Dispense  Refill  . Ferrous Fumarate 90 MG TABS   Oral   Take 1 tablet (90 mg total) by mouth daily.   90 each   0   . Lisdexamfetamine Dimesylate (VYVANSE) 10 MG CAPS   Oral   Take 10 mg by mouth daily.         . norgestimate-ethinyl estradiol (ORTHO-CYCLEN,SPRINTEC,PREVIFEM) 0.25-35 MG-MCG tablet   Oral   Take 1 tablet by mouth daily.   1 Package   2   . ranitidine (ZANTAC) 75 MG tablet   Oral   Take 1 tablet (75 mg total) by mouth 2 (two) times daily.   60 tablet   0     Allergies Review of patient's allergies indicates no known allergies.  No family history on file.  Social History Social History  Substance Use Topics  . Smoking status: Never Smoker   . Smokeless tobacco: None  . Alcohol Use: No    Review of Systems  Constitutional: Negative for fever. Eyes: Negative for visual changes. ENT: Negative for sore throat   Genitourinary: Negative for dysuria. Musculoskeletal: Negative for back pain. Skin: Negative for rash. Neurological: Negative for headaches   ____________________________________________   PHYSICAL EXAM:  VITAL SIGNS: ED Triage Vitals  Enc Vitals Group      BP 11/10/15 1324 135/87 mmHg     Pulse Rate 11/10/15 1324 115     Resp 11/10/15 1324 18     Temp 11/10/15 1324 98.5 F (36.9 C)     Temp Source 11/10/15 1324 Oral     SpO2 11/10/15 1324 100 %     Weight 11/10/15 1324 250 lb (113.399 kg)     Height 11/10/15 1324  (1.651 m)     Head Cir --      Peak Flow --      Pain Score 11/10/15 1325 0     Pain Loc --      Pain Edu? --      Excl. in GC? --      Constitutional: Alert and oriented. Well appearing and in no distress. Eyes: Conjunctivae are normal.  ENT   Head: Normocephalic and atraumatic.   Mouth/Throat: Mucous membranes are moist. Cardiovascular: Normal rate, regular rhythm.  Respiratory: Normal respiratory effort without tachypnea nor retractions.  Gastrointestinal: Soft and non-tender in all quadrants. No distention. There is no CVA tenderness. Musculoskeletal: Nontender with normal range of motion in all extremities. Neurologic:  Normal speech and language. No gross focal neurologic deficits are appreciated. Skin:  Skin is warm, dry and intact. No rash noted. Psychiatric: Mood and affect are normal. Patient exhibits appropriate insight and judgment.  ____________________________________________    LABS (pertinent positives/negatives)  Labs Reviewed  COMPREHENSIVE METABOLIC PANEL - Abnormal; Notable for the following:    Glucose, Bld 122 (*)    Total Bilirubin <0.1 (*)    All other components within normal limits  CBC - Abnormal; Notable for the following:    Hemoglobin 10.5 (*)    HCT 33.5 (*)    MCV 77.8 (*)    MCH 24.3 (*)    MCHC 31.3 (*)    RDW 18.8 (*)    All other components within normal limits  URINALYSIS COMPLETEWITH MICROSCOPIC ([facility] ONLY) - Abnormal; Notable for the following:    Color, Urine YELLOW (*)    APPearance HAZY (*)    Leukocytes, UA 3+ (*)    Squamous Epithelial / LPF 6-30 (*)    All other components within normal limits  LIPASE, BLOOD  POC URINE PREG, ED  POCT  PREGNANCY, URINE    ____________________________________________     ____________________________________________    RADIOLOGY I have personally reviewed any xrays that were ordered on this patient: None  ____________________________________________   PROCEDURES  Procedure(s) performed: none   ____________________________________________   INITIAL IMPRESSION / ASSESSMENT AND PLAN / ED COURSE  Pertinent labs & imaging results that were available during my care of the patient were reviewed by me and considered in my medical decision making (see chart for details).  Patient well-appearing and in no distress. She has symptoms of an upper respiratory infection. I recommended taking NyQuil or DayQuil for symptom relief. She has no dysuria  ____________________________________________   FINAL CLINICAL IMPRESSION(S) / ED DIAGNOSES  Final diagnoses:  Upper respiratory infection     [name], MD 11/10/15 [id number]

## 2015-11-10 NOTE — ED Notes (Signed)
Nausea and vomiting since yesterday.

## 2016-01-03 ENCOUNTER — Emergency Department
Admission: EM | Admit: 2016-01-03 | Discharge: 2016-01-03 | Disposition: A | Discharge status: Home / Self Care | Payer: Medicaid Other | Attending: Emergency Medicine | Admitting: Emergency Medicine

## 2016-01-03 ENCOUNTER — Encounter: Payer: Self-pay | Admitting: Emergency Medicine

## 2016-01-03 DIAGNOSIS — J069 Acute upper respiratory infection, unspecified: Secondary | ICD-10-CM | POA: Diagnosis not present

## 2016-01-03 DIAGNOSIS — E669 Obesity, unspecified: Secondary | ICD-10-CM | POA: Insufficient documentation

## 2016-01-03 DIAGNOSIS — J029 Acute pharyngitis, unspecified: Secondary | ICD-10-CM | POA: Diagnosis present

## 2016-01-03 LAB — POCT RAPID STREP A: STREPTOCOCCUS, GROUP A SCREEN (DIRECT): NEGATIVE

## 2016-01-03 MED ORDER — PSEUDOEPH-BROMPHEN-DM 30-2-10 MG/5ML PO SYRP: 10.0000 mL | ORAL_SOLUTION | Freq: Four times a day (QID) | ORAL | Status: DC | PRN

## 2016-01-03 NOTE — Discharge Instructions (Signed)
Viral Infections °A viral infection can be caused by different types of viruses. Most viral infections are not serious and resolve on their own. However, some infections may cause severe symptoms and may lead to further complications. °SYMPTOMS °Viruses can frequently cause: °· Minor sore throat. °· Aches and pains. °· Headaches. °· Runny nose. °· Different types of rashes. °· Watery eyes. °· Tiredness. °· Cough. °· Loss of appetite. °· Gastrointestinal infections, resulting in nausea, vomiting, and diarrhea. °These symptoms do not respond to antibiotics because the infection is not caused by bacteria. However, you might catch a bacterial infection following the viral infection. This is sometimes called a "superinfection." Symptoms of such a bacterial infection may include: °· Worsening sore throat with pus and difficulty swallowing. °· Swollen neck glands. °· Chills and a high or persistent fever. °· Severe headache. °· Tenderness over the sinuses. °· Persistent overall ill feeling (malaise), muscle aches, and tiredness (fatigue). °· Persistent cough. °· Yellow, green, or brown mucus production with coughing. °HOME CARE INSTRUCTIONS  °· Only take over-the-counter or prescription medicines for pain, discomfort, diarrhea, or fever as directed by your caregiver. °· Drink enough water and fluids to keep your urine clear or pale yellow. Sports drinks can provide valuable electrolytes, sugars, and hydration. °· Get plenty of rest and maintain proper nutrition. Soups and broths with crackers or rice are fine. °SEEK IMMEDIATE MEDICAL CARE IF:  °· You have severe headaches, shortness of breath, chest pain, neck pain, or an unusual rash. °· You have uncontrolled vomiting, diarrhea, or you are unable to keep down fluids. °· You or your child has an oral temperature above 102° F (38.9° C), not controlled by medicine. °· Your baby is older than 3 months with a rectal temperature of 102° F (38.9° C) or higher. °· Your baby is 3  months old or younger with a rectal temperature of 100.4° F (38° C) or higher. °MAKE SURE YOU:  °· Understand these instructions. °· Will watch your condition. °· Will get help right away if you are not doing well or get worse. °  °This information is not intended to replace advice given to you by your health care provider. Make sure you discuss any questions you have with your health care provider. °  °Document Released: 06/09/2005 Document Revised: 11/22/2011 Document Reviewed: 02/05/2015 °Elsevier Interactive Patient Education ©2016 [employer] ° °

## 2016-01-03 NOTE — ED Notes (Signed)
Pt alert and oriented X4, active, cooperative, pt in NAD. RR even and unlabored, color WNL.  Pt informed to return if any life threatening symptoms occur.   

## 2016-01-03 NOTE — ED Notes (Signed)
Sore throat and congestion X 2 days. Denies fever. Pt denies anyone around her being sick. Pt alert and oriented X4, active, cooperative, pt in NAD. RR even and unlabored, color WNL.

## 2016-01-03 NOTE — ED Provider Notes (Signed)
[facility] Emergency Department Provider Note  ____________________________________________  Time seen: Approximately 10:16 AM  I have reviewed the triage vital signs and the nursing notes.   HISTORY  Chief Complaint Sore Throat and Cough    HPI Julie Mcclain is a 20 y.o. female , NAD, presents to the emergency department with 2 day history of cough, chest congestion and sore throat due to cough. Has not had any chest pain, shortness breath or wheezing. Denies any fever, chills, body aches. Has not had any nasal congestion, runny nose, ear pain, sinus pressure. Denies any exposure to sick contacts. Has not taken anything over-the-counter for current symptoms.   Past Medical History  Diagnosis Date  . Obesity   . Menometrorrhagia     There are no active problems to display for this patient.   History reviewed. No pertinent past surgical history.  Current Outpatient Rx  Name  Route  Sig  Dispense  Refill  . brompheniramine-pseudoephedrine-DM 30-2-10 MG/5ML syrup   Oral   Take 10 mLs by mouth 4 (four) times daily as needed.   200 mL   0   . Ferrous Fumarate 90 MG TABS   Oral   Take 1 tablet (90 mg total) by mouth daily.   90 each   0   . Lisdexamfetamine Dimesylate (VYVANSE) 10 MG CAPS   Oral   Take 10 mg by mouth daily.         . norgestimate-ethinyl estradiol (ORTHO-CYCLEN,SPRINTEC,PREVIFEM) 0.25-35 MG-MCG tablet   Oral   Take 1 tablet by mouth daily.   1 Package   2   . ranitidine (ZANTAC) 75 MG tablet   Oral   Take 1 tablet (75 mg total) by mouth 2 (two) times daily.   60 tablet   0     Allergies Review of patient's allergies indicates no known allergies.  No family history on file.  Social History Social History  Substance Use Topics  . Smoking status: Never Smoker   . Smokeless tobacco: None  . Alcohol Use: No     Review of Systems  Constitutional: No fever/chills, fatigue Eyes: No visual changes. No  discharge, redness, swelling ENT: Positive sore throat. No nasal congestion, runny nose, ear pain, sinus pressure, sneezing. Cardiovascular: No chest pain. Respiratory: Positive chest congestion, cough. No shortness of breath. No wheezing.  Musculoskeletal: Negative for general myalgias.  Skin: Negative for rash. Neurological: Negative for headaches, focal weakness or numbness. 10-point ROS otherwise negative.  ____________________________________________   PHYSICAL EXAM:  VITAL SIGNS: ED Triage Vitals  Enc Vitals Group     BP 01/03/16 0940 124/80 mmHg     Pulse Rate 01/03/16 0940 101     Resp 01/03/16 0940 18     Temp 01/03/16 0940 98.3 F (36.8 C)     Temp Source 01/03/16 0940 Oral     SpO2 01/03/16 0940 98 %     Weight 01/03/16 0940 240 lb (108.863 kg)     Height 01/03/16 0940 5\' 3"  (1.6 m)     Head Cir --      Peak Flow --      Pain Score 01/03/16 0941 7     Pain Loc --      Pain Edu? --      Excl. in GC? --      Constitutional: Alert and oriented. Well appearing and in no acute distress. Eyes: Conjunctivae are normal. PERRL. EOMI without pain.  Head: Atraumatic. ENT:  Ears: TMs visualized bilaterally without erythema, effusion, bulging, perforation      Nose: No congestion but trace clear rhinnorhea.      Mouth/Throat: Mucous membranes are moist. Pharynx without erythema, swelling, exudate. Clear postnasal drip noted. Neck: No stridor. Supple with full range of motion. Hematological/Lymphatic/Immunilogical: No cervical lymphadenopathy. Cardiovascular: Normal rate, regular rhythm. Normal S1 and S2.  Good peripheral circulation. Respiratory: Normal respiratory effort without tachypnea or retractions. Lungs CTAB with breath sounds noted in all lung fields. Neurologic:  Normal speech and language. No gross focal neurologic deficits are appreciated.  Skin:  Skin is warm, dry and intact. No rash noted. Psychiatric: Mood and affect are normal. Speech and behavior are  normal. Patient exhibits appropriate insight and judgement.   ____________________________________________   LABS (all labs ordered are listed, but only abnormal results are displayed)  Labs Reviewed  CULTURE, GROUP A STREP [facility])  POCT RAPID STREP A   ____________________________________________  EKG  None ____________________________________________  RADIOLOGY  None ____________________________________________    PROCEDURES  Procedure(s) performed: None    Medications - No data to display   ____________________________________________   INITIAL IMPRESSION / ASSESSMENT AND PLAN / ED COURSE  Pertinent lab results that were available during my care of the patient were reviewed by me and considered in my medical decision making (see chart for details).  Patient's diagnosis is consistent with viral upper respiratory infection. Patient will be discharged home with prescriptions for Bromfed-DM syrup to take as directed. Patient is to follow up with [facility] if symptoms persist past this treatment course. Patient is given ED precautions to return to the ED for any worsening or new symptoms.      ____________________________________________  FINAL CLINICAL IMPRESSION(S) / ED DIAGNOSES  Final diagnoses:  Viral upper respiratory infection      NEW MEDICATIONS STARTED DURING THIS VISIT:  Discharge Medication List as of 01/03/2016 10:30 AM    START taking these medications   Details  brompheniramine-pseudoephedrine-DM 30-2-10 MG/5ML syrup Take 10 mLs by mouth 4 (four) times daily as needed., Starting 01/03/2016, Until Discontinued, Print             Hope Pigeon, PA-C 01/03/16 1050  Emily Filbert, MD 01/03/16 [phone]

## 2016-01-03 NOTE — ED Notes (Signed)
Patient to ER for c/o sore throat and cough that started yesterday. States sore throat worsens with cough.

## 2016-01-05 LAB — CULTURE, GROUP A STREP (THRC)

## 2016-01-18 ENCOUNTER — Emergency Department
Admission: EM | Admit: 2016-01-18 | Discharge: 2016-01-18 | Disposition: A | Discharge status: Home / Self Care | Payer: Medicaid Other | Attending: Emergency Medicine | Admitting: Emergency Medicine

## 2016-01-18 DIAGNOSIS — R1010 Upper abdominal pain, unspecified: Secondary | ICD-10-CM | POA: Diagnosis present

## 2016-01-18 DIAGNOSIS — R519 Headache, unspecified: Secondary | ICD-10-CM

## 2016-01-18 DIAGNOSIS — E669 Obesity, unspecified: Secondary | ICD-10-CM | POA: Diagnosis not present

## 2016-01-18 DIAGNOSIS — R51 Headache: Secondary | ICD-10-CM | POA: Diagnosis not present

## 2016-01-18 MED ORDER — ACETAMINOPHEN 325 MG PO TABS
650.0000 mg | ORAL_TABLET | Freq: Once | ORAL | Status: AC
  Administered 2016-01-18: 650 mg via ORAL
  Filled 2016-01-18: qty 2

## 2016-01-18 MED ORDER — ONDANSETRON 4 MG PO TBDP
4.0000 mg | ORAL_TABLET | Freq: Once | ORAL | Status: AC
  Administered 2016-01-18: 4 mg via ORAL
  Filled 2016-01-18: qty 1

## 2016-01-18 NOTE — ED Notes (Signed)
Pt reports she woke up this morning with a headache also c/o upper abd pain and vomiting 3 times denies diarrhea.

## 2016-01-18 NOTE — ED Provider Notes (Signed)
[facility] Emergency Department Provider Note  ____________________________________________    I have reviewed the triage vital signs and the nursing notes.   HISTORY  Chief Complaint Abdominal Pain and Emesis    HPI Julie Mcclain is a 20 y.o. female who presents with complaints of a headache this morning with nausea and upper abdominal discomfort. Patient reports a history of frequent headaches and this is similar to prior. She did not take anything prior to arrival. She has no neck pain. No focal neuro deficits. No fevers or chills. No diarrhea. No recent travel.Patient reports she only engages with women so she cannot be pregnant     Past Medical History  Diagnosis Date  . Obesity   . Menometrorrhagia     There are no active problems to display for this patient.   No past surgical history on file.  Current Outpatient Rx  Name  Route  Sig  Dispense  Refill  . brompheniramine-pseudoephedrine-DM 30-2-10 MG/5ML syrup   Oral   Take 10 mLs by mouth 4 (four) times daily as needed.   200 mL   0   . Ferrous Fumarate 90 MG TABS   Oral   Take 1 tablet (90 mg total) by mouth daily.   90 each   0   . Lisdexamfetamine Dimesylate (VYVANSE) 10 MG CAPS   Oral   Take 10 mg by mouth daily.         . norgestimate-ethinyl estradiol (ORTHO-CYCLEN,SPRINTEC,PREVIFEM) 0.25-35 MG-MCG tablet   Oral   Take 1 tablet by mouth daily.   1 Package   2   . ranitidine (ZANTAC) 75 MG tablet   Oral   Take 1 tablet (75 mg total) by mouth 2 (two) times daily.   60 tablet   0     Allergies Review of patient's allergies indicates no known allergies.  No family history on file.  Social History Social History  Substance Use Topics  . Smoking status: Never Smoker   . Smokeless tobacco: Not on file  . Alcohol Use: No    Review of Systems  Constitutional: Negative for fever. Eyes: Negative for Changes in vision ENT: Negative for sore throat, negative  for neck pain Cardiovascular: Negative for chest pain Respiratory: Negative for cough as above Gastrointestinal: Negative for abdominal pain Genitourinary: Negative for dysuria. Musculoskeletal: Negative for back pain. Skin: Negative for rash. Neurological: Negative for focal weakness Psychiatric: no anxiety    ____________________________________________   PHYSICAL EXAM:  VITAL SIGNS: ED Triage Vitals  Enc Vitals Group     BP 01/18/16 0909 117/75 mmHg     Pulse Rate 01/18/16 0909 120     Resp 01/18/16 0909 20     Temp 01/18/16 0909 98.4 F (36.9 C)     Temp Source 01/18/16 0909 Oral     SpO2 01/18/16 0909 100 %     Weight 01/18/16 0909 256 lb (116.121 kg)     Height 01/18/16 0909 5\' 3"  (1.6 m)     Head Cir --      Peak Flow --      Pain Score 01/18/16 0911 10     Pain Loc --      Pain Edu? --      Excl. in GC? --      Constitutional: Alert and oriented. Well appearing and in no distress.  Eyes: Conjunctivae are normal. No erythema or injection ENT   Head: Normocephalic and atraumatic.   Mouth/Throat: Mucous membranes are moist. Cardiovascular:  Normal rate, regular rhythm. Normal and symmetric distal pulses are present in the upper extremities.  Respiratory: Normal respiratory effort without tachypnea nor retractions. Breath sounds are clear and equal bilaterally.  Gastrointestinal: Soft and non-tender in all quadrants. No distention. There is no CVA tenderness. Genitourinary: deferred Musculoskeletal: Nontender with normal range of motion in all extremities. No lower extremity tenderness nor edema. Neurologic:  Normal speech and language. No gross focal neurologic deficits are appreciated. Skin:  Skin is warm, dry and intact. No rash noted. Psychiatric: Mood and affect are normal. Patient exhibits appropriate insight and judgment.  ____________________________________________    LABS (pertinent positives/negatives)  Labs Reviewed - No data to  display  ____________________________________________   EKG  None  ____________________________________________    RADIOLOGY  None  ____________________________________________   PROCEDURES  Procedure(s) performed: none  Critical Care performed: none  ____________________________________________   INITIAL IMPRESSION / ASSESSMENT AND PLAN / ED COURSE  Pertinent labs & imaging results that were available during my care of the patient were reviewed by me and considered in my medical decision making (see chart for details).  Patient well-appearing no distress. She appears quite comfortable. Her exam is benign except for significant tachycardia. We will treat with Tylenol for pain.  Recommended further workup to the patient given tachycardia but she says she feels "fine and is ready to go because her mother is here to pick her up. "She is aware that she is leaving without completion of recommended workup and she accepted the risks.    ____________________________________________   FINAL CLINICAL IMPRESSION(S) / ED DIAGNOSES  Final diagnoses:  Acute nonintractable headache, unspecified headache type          [name], MD 01/18/16 1030

## 2016-01-18 NOTE — ED Notes (Signed)
Patient alert and walking around room, patient on cell phone texting during assessment. Patient does not appear to be in any distress at this time. Breathing equal and unlabored.

## 2016-01-18 NOTE — Discharge Instructions (Signed)

## 2016-01-18 NOTE — ED Notes (Signed)
Patient states that she woke up this morning with a headache and N/V. Patient states that she took 1 Tylenol at home. Patient has a history of headaches and states that she does vomit with her headaches.

## 2016-06-16 ENCOUNTER — Encounter: Payer: Self-pay | Admitting: Emergency Medicine

## 2016-06-16 ENCOUNTER — Emergency Department
Admission: EM | Admit: 2016-06-16 | Discharge: 2016-06-16 | Disposition: A | Discharge status: Home / Self Care | Payer: Medicaid Other | Attending: Emergency Medicine | Admitting: Emergency Medicine

## 2016-06-16 DIAGNOSIS — R109 Unspecified abdominal pain: Secondary | ICD-10-CM | POA: Diagnosis present

## 2016-06-16 DIAGNOSIS — R1084 Generalized abdominal pain: Secondary | ICD-10-CM | POA: Insufficient documentation

## 2016-06-16 LAB — LIPASE, BLOOD: LIPASE: 15 U/L (ref 11–51)

## 2016-06-16 LAB — CBC WITH DIFFERENTIAL/PLATELET
Basophils Absolute: 0.1 10*3/uL (ref 0–0.1)
Basophils Relative: 0 %
Eosinophils Absolute: 0.1 10*3/uL (ref 0–0.7)
Eosinophils Relative: 1 %
HEMATOCRIT: 35.1 % (ref 35.0–47.0)
Hemoglobin: 11.3 g/dL — ABNORMAL LOW (ref 12.0–16.0)
LYMPHS ABS: 2.9 10*3/uL (ref 1.0–3.6)
LYMPHS PCT: 26 %
MCH: 25.3 pg — ABNORMAL LOW (ref 26.0–34.0)
MCHC: 32 g/dL (ref 32.0–36.0)
MCV: 79.1 fL — AB (ref 80.0–100.0)
MONO ABS: 0.7 10*3/uL (ref 0.2–0.9)
MONOS PCT: 6 %
NEUTROS ABS: 7.4 10*3/uL — AB (ref 1.4–6.5)
Neutrophils Relative %: 67 %
Platelets: 352 10*3/uL (ref 150–440)
RBC: 4.44 MIL/uL (ref 3.80–5.20)
RDW: 17.3 % — AB (ref 11.5–14.5)
WBC: 11.2 10*3/uL — ABNORMAL HIGH (ref 3.6–11.0)

## 2016-06-16 LAB — COMPREHENSIVE METABOLIC PANEL
ALT: 17 U/L (ref 14–54)
ANION GAP: 4 — AB (ref 5–15)
AST: 20 U/L (ref 15–41)
Albumin: 4 g/dL (ref 3.5–5.0)
Alkaline Phosphatase: 76 U/L (ref 38–126)
BILIRUBIN TOTAL: 0.6 mg/dL (ref 0.3–1.2)
BUN: 10 mg/dL (ref 6–20)
CO2: 27 mmol/L (ref 22–32)
Calcium: 9.3 mg/dL (ref 8.9–10.3)
Chloride: 108 mmol/L (ref 101–111)
Creatinine, Ser: 0.98 mg/dL (ref 0.44–1.00)
GFR calc Af Amer: >60 mL/min (ref >60)
Glucose, Bld: 94 mg/dL (ref 65–99)
POTASSIUM: 3.4 mmol/L — AB (ref 3.5–5.1)
Sodium: 139 mmol/L (ref 135–145)
TOTAL PROTEIN: 8 g/dL (ref 6.5–8.1)

## 2016-06-16 LAB — URINALYSIS COMPLETE WITH MICROSCOPIC (ARMC ONLY)
BILIRUBIN URINE: NEGATIVE
Bacteria, UA: NONE SEEN
GLUCOSE, UA: NEGATIVE mg/dL
HGB URINE DIPSTICK: NEGATIVE
Ketones, ur: NEGATIVE mg/dL
Nitrite: NEGATIVE
Protein, ur: 30 mg/dL — AB
Specific Gravity, Urine: 1.031 — ABNORMAL HIGH (ref 1.005–1.030)
pH: 5 (ref 5.0–8.0)

## 2016-06-16 LAB — POCT PREGNANCY, URINE: Preg Test, Ur: NEGATIVE

## 2016-06-16 MED ORDER — DICYCLOMINE HCL 20 MG PO TABS: 20.0000 mg | ORAL_TABLET | Freq: Three times a day (TID) | ORAL | 0 refills | Status: DC | PRN

## 2016-06-16 MED ORDER — DICYCLOMINE HCL 20 MG PO TABS
ORAL_TABLET | ORAL | Status: AC
  Administered 2016-06-16: 20 mg via ORAL
  Filled 2016-06-16: qty 1

## 2016-06-16 MED ORDER — DICYCLOMINE HCL 20 MG PO TABS
20.0000 mg | ORAL_TABLET | Freq: Once | ORAL | Status: AC
  Administered 2016-06-16: 20 mg via ORAL
  Filled 2016-06-16: qty 1

## 2016-06-16 MED ORDER — DICYCLOMINE HCL 10 MG/ML IM SOLN: 20.0000 mg | Freq: Once | INTRAMUSCULAR | Status: DC

## 2016-06-16 MED ORDER — DICYCLOMINE HCL 10 MG PO CAPS: 20.0000 mg | ORAL_CAPSULE | Freq: Once | ORAL | Status: DC

## 2016-06-16 NOTE — Discharge Instructions (Signed)
Please seek medical attention for any high fevers, chest pain, shortness of breath, change in behavior, persistent vomiting, bloody stool or any other new or concerning symptoms.  

## 2016-06-16 NOTE — ED Provider Notes (Signed)
[facility] Emergency Department Provider Note    ____________________________________________   I have reviewed the triage vital signs and the nursing notes.   HISTORY  Chief Complaint Abdominal Pain   History limited by: Not Limited   HPI Julie Mcclain is a 20 y.o. female who presents to the emergency department today because of concern for abdominal pain. It started today. It has been constant since it started. She describes it as being severe, and worse with bending over. She describes it as being cramping. It does radiate to her back. She thinks it might be related to her eating raw chicken two days ago. Has had some associated diarrhea, which has been non bloody. No nausea or vomiting. No fevers.   Past Medical History:  Diagnosis Date  . Menometrorrhagia   . Obesity     There are no active problems to display for this patient.   History reviewed. No pertinent surgical history.  Prior to Admission medications   Medication Sig Start Date End Date Taking? Authorizing Provider  brompheniramine-pseudoephedrine-DM 30-2-10 MG/5ML syrup Take 10 mLs by mouth 4 (four) times daily as needed. 01/03/16   Jami L Hagler, PA-C  Ferrous Fumarate 90 MG TABS Take 1 tablet (90 mg total) by mouth daily. 05/13/15   Loleta Roseory Forbach, MD  Lisdexamfetamine Dimesylate (VYVANSE) 10 MG CAPS Take 10 mg by mouth daily.    Historical Provider, MD  norgestimate-ethinyl estradiol (ORTHO-CYCLEN,SPRINTEC,PREVIFEM) 0.25-35 MG-MCG tablet Take 1 tablet by mouth daily. 05/13/15   Loleta Roseory Forbach, MD  ranitidine (ZANTAC) 75 MG tablet Take 1 tablet (75 mg total) by mouth 2 (two) times daily. 10/12/15   Myrna Blazeravid Matthew Schaevitz, MD    Allergies Review of patient's allergies indicates no known allergies.  No family history on file.  Social History Social History  Substance Use Topics  . Smoking status: Never Smoker  . Smokeless tobacco: Never Used  . Alcohol use No    Review of  Systems  Constitutional: Negative for fever. Cardiovascular: Negative for chest pain. Respiratory: Negative for shortness of breath. Gastrointestinal: Positive for abdominal pain. Genitourinary: Negative for dysuria. Musculoskeletal: Positive for back pain. Skin: Negative for rash. Neurological: Negative for headaches, focal weakness or numbness.  10-point ROS otherwise negative.  ____________________________________________   PHYSICAL EXAM:  VITAL SIGNS: ED Triage Vitals  Enc Vitals Group     BP 06/16/16 1256 (!) 108/58     Pulse Rate 06/16/16 1256 (!) 101     Resp 06/16/16 1256 20     Temp 06/16/16 1256 98.5 F (36.9 C)     Temp Source 06/16/16 1256 Oral     SpO2 06/16/16 1256 99 %     Weight 06/16/16 1257 250 lb (113.4 kg)     Height 06/16/16 1257 5\' 4"  (1.626 m)     Head Circumference --      Peak Flow --      Pain Score 06/16/16 1257 9   Constitutional: Alert and oriented. Well appearing and in no distress. Eyes: Conjunctivae are normal. Normal extraocular movements. ENT   Head: Normocephalic and atraumatic.   Nose: No congestion/rhinnorhea.   Mouth/Throat: Mucous membranes are moist.   Neck: No stridor. Hematological/Lymphatic/Immunilogical: No cervical lymphadenopathy. Cardiovascular: Normal rate, regular rhythm.  No murmurs, rubs, or gallops. Respiratory: Normal respiratory effort without tachypnea nor retractions. Breath sounds are clear and equal bilaterally. No wheezes/rales/rhonchi. Gastrointestinal: Soft and somewhat diffusely tender to palpation. No rebound. No guarding. Genitourinary: Deferred Musculoskeletal: Normal range of motion in all extremities.  No lower extremity edema. Neurologic:  Normal speech and language. No gross focal neurologic deficits are appreciated.  Skin:  Skin is warm, dry and intact. No rash noted. Psychiatric: Mood and affect are normal. Speech and behavior are normal. Patient exhibits appropriate insight and  judgment.  ____________________________________________    LABS (pertinent positives/negatives)  Labs Reviewed  CBC WITH DIFFERENTIAL/PLATELET - Abnormal; Notable for the following:       Result Value   WBC 11.2 (*)    Hemoglobin 11.3 (*)    MCV 79.1 (*)    MCH 25.3 (*)    RDW 17.3 (*)    Neutro Abs 7.4 (*)    All other components within normal limits  COMPREHENSIVE METABOLIC PANEL - Abnormal; Notable for the following:    Potassium 3.4 (*)    Anion gap 4 (*)    All other components within normal limits  URINALYSIS COMPLETEWITH MICROSCOPIC ([facility] ONLY) - Abnormal; Notable for the following:    Color, Urine YELLOW (*)    APPearance HAZY (*)    Specific Gravity, Urine 1.031 (*)    Protein, ur 30 (*)    Leukocytes, UA 2+ (*)    Squamous Epithelial / LPF 6-30 (*)    All other components within normal limits  LIPASE, BLOOD  POC URINE PREG, ED  POCT PREGNANCY, URINE     ____________________________________________   EKG  None  ____________________________________________    RADIOLOGY  None  ____________________________________________   PROCEDURES  Procedures  ____________________________________________   INITIAL IMPRESSION / ASSESSMENT AND PLAN / ED COURSE  Pertinent labs & imaging results that were available during my care of the patient were reviewed by me and considered in my medical decision making (see chart for details).  Patient presented to the emergency department today because of concerns for abdominal cramping after potentially eating raw chicken a few days ago. On exam patient had some mild diffuse tenderness. Patient was given Bentyl and stated she did feel better. Patient's urine did have some leukocytes and white blood cells however appeared to be a dirty specimen. Given the patient did not have any dysuria feel I will defer antibiotic treatment at this time however will send urine for a culture. Will give patient prescription for Bentyl. Did  discuss return precautions. ____________________________________________   FINAL CLINICAL IMPRESSION(S) / ED DIAGNOSES  Final diagnoses:  Generalized abdominal pain     Note: This dictation was prepared with Dragon dictation. Any transcriptional errors that result from this process are unintentional   Phineas Semen, MD 06/16/16 1526

## 2016-06-16 NOTE — ED Triage Notes (Signed)
Reports abd cramping since yesterday after "eating some raw chicken by accident"  Denies n/v/d.  Skin w/d.

## 2016-06-16 NOTE — ED Notes (Signed)
Pt states she is feeling a little better after given bentyl PO.. MD notified,..Marland Kitchen

## 2016-06-17 LAB — URINE CULTURE

## 2016-07-24 ENCOUNTER — Encounter: Payer: Self-pay | Admitting: Emergency Medicine

## 2016-07-24 ENCOUNTER — Emergency Department
Admission: EM | Admit: 2016-07-24 | Discharge: 2016-07-25 | Disposition: A | Discharge status: Home / Self Care | Payer: Medicaid Other | Attending: Emergency Medicine | Admitting: Emergency Medicine

## 2016-07-24 DIAGNOSIS — R197 Diarrhea, unspecified: Secondary | ICD-10-CM | POA: Diagnosis not present

## 2016-07-24 DIAGNOSIS — R110 Nausea: Secondary | ICD-10-CM | POA: Diagnosis not present

## 2016-07-24 DIAGNOSIS — Z79899 Other long term (current) drug therapy: Secondary | ICD-10-CM | POA: Insufficient documentation

## 2016-07-24 DIAGNOSIS — R1084 Generalized abdominal pain: Secondary | ICD-10-CM | POA: Diagnosis not present

## 2016-07-24 LAB — URINALYSIS COMPLETE WITH MICROSCOPIC (ARMC ONLY)
Bilirubin Urine: NEGATIVE
Glucose, UA: NEGATIVE mg/dL
HGB URINE DIPSTICK: NEGATIVE
KETONES UR: NEGATIVE mg/dL
Nitrite: NEGATIVE
PH: 5 (ref 5.0–8.0)
PROTEIN: 30 mg/dL — AB
SPECIFIC GRAVITY, URINE: 1.029 (ref 1.005–1.030)

## 2016-07-24 LAB — COMPREHENSIVE METABOLIC PANEL
ALK PHOS: 83 U/L (ref 38–126)
ALT: 17 U/L (ref 14–54)
ANION GAP: 7 (ref 5–15)
AST: 18 U/L (ref 15–41)
Albumin: 4 g/dL (ref 3.5–5.0)
BUN: 14 mg/dL (ref 6–20)
CALCIUM: 9.5 mg/dL (ref 8.9–10.3)
CHLORIDE: 105 mmol/L (ref 101–111)
CO2: 27 mmol/L (ref 22–32)
Creatinine, Ser: 0.94 mg/dL (ref 0.44–1.00)
GFR calc non Af Amer: >60 mL/min (ref >60)
Glucose, Bld: 86 mg/dL (ref 65–99)
Potassium: 4 mmol/L (ref 3.5–5.1)
SODIUM: 139 mmol/L (ref 135–145)
Total Bilirubin: 0.3 mg/dL (ref 0.3–1.2)
Total Protein: 8 g/dL (ref 6.5–8.1)

## 2016-07-24 LAB — CBC
HCT: 37.1 % (ref 35.0–47.0)
HEMOGLOBIN: 11.5 g/dL — AB (ref 12.0–16.0)
MCH: 25 pg — AB (ref 26.0–34.0)
MCHC: 31.2 g/dL — ABNORMAL LOW (ref 32.0–36.0)
MCV: 80.1 fL (ref 80.0–100.0)
Platelets: 436 10*3/uL (ref 150–440)
RBC: 4.63 MIL/uL (ref 3.80–5.20)
RDW: 17.1 % — ABNORMAL HIGH (ref 11.5–14.5)
WBC: 10.7 10*3/uL (ref 3.6–11.0)

## 2016-07-24 LAB — POCT PREGNANCY, URINE: PREG TEST UR: NEGATIVE

## 2016-07-24 LAB — LIPASE, BLOOD: LIPASE: 18 U/L (ref 11–51)

## 2016-07-24 NOTE — ED Notes (Signed)
FIRST NURSE NOTE: Patient presents with c/o abdominal cramping with (+) N/V. Patient reporting that she thinks that she has a virus. Patient reports eating "raw hamburger helper". NAD noted at this time.

## 2016-07-24 NOTE — ED Triage Notes (Signed)
Pt ambulatory to triage with no difficulty. Pt reports started on Monday with abd pain, n/v/d,. Over the last 24 hours pt reports 2 episodes of diarrhea and one episode of vomiting. Pt denies fever, urinary sx or vaginal discharge. Pt playing on her cell phone during triage. No distress noted.

## 2016-07-25 ENCOUNTER — Encounter: Payer: Self-pay | Admitting: Emergency Medicine

## 2016-07-25 MED ORDER — ONDANSETRON HCL 4 MG PO TABS: ORAL_TABLET | ORAL | 0 refills | Status: DC

## 2016-07-25 MED ORDER — DICYCLOMINE HCL 20 MG PO TABS: 20.0000 mg | ORAL_TABLET | Freq: Three times a day (TID) | ORAL | 0 refills | Status: DC | PRN

## 2016-07-25 MED ORDER — DICYCLOMINE HCL 10 MG PO CAPS
10.0000 mg | ORAL_CAPSULE | Freq: Once | ORAL | Status: AC
  Administered 2016-07-25: 10 mg via ORAL
  Filled 2016-07-25: qty 1

## 2016-07-25 NOTE — ED Notes (Signed)
Patient reports symptoms started on Monday after eating possible raw chicken.  Reports nausea, vomiting and diarrhea with generalized upper abdominal pain.  Patient reports last vomited on Saturday and last diarrhea was also on Saturday.

## 2016-07-25 NOTE — Discharge Instructions (Signed)

## 2016-07-25 NOTE — ED Provider Notes (Signed)
[facility] Emergency Department Provider Note  ____________________________________________   First MD Initiated Contact with Patient 07/25/16 0030     (approximate)  I have reviewed the triage vital signs and the nursing notes.   HISTORY  Chief Complaint Abdominal Pain and Emesis    HPI Julie Mcclain is a 20 y.o. female with no significant past medical history who presents for evaluation of several days of waxing and waning vague generalized abdominal pain after possibly eating some raw hamburger helper.  She is not certain but notes that her symptoms started after eating several days ago, possibly as much as 5 or 6 days.  She states the symptoms are mild but persistent.  She has had nausea but denies any episodes of vomiting.  She has had some loose nonbloody stools.  Nothing in particular makes it better or worse.  She is still able to eat and drink normally.  She denies fever/chills, chest pain, shortness of breath, dysuria.  Of note, she had almost exactly the same presentation about 5 weeks ago after possibly eating some raw chicken.  Dr. Derrill KayGoodman evaluated her and she felt better with some Bentyl.   Past Medical History:  Diagnosis Date  . Menometrorrhagia   . Obesity     There are no active problems to display for this patient.   History reviewed. No pertinent surgical history.  Prior to Admission medications   Medication Sig Start Date End Date Taking? Authorizing Provider  brompheniramine-pseudoephedrine-DM 30-2-10 MG/5ML syrup Take 10 mLs by mouth 4 (four) times daily as needed. 01/03/16   Jami L Hagler, PA-C  dicyclomine (BENTYL) 20 MG tablet Take 1 tablet (20 mg total) by mouth 3 (three) times daily as needed for spasms (Abdominal pain). 07/25/16 07/25/17  Loleta Roseory Jeremy Mclamb, MD  Ferrous Fumarate 90 MG TABS Take 1 tablet (90 mg total) by mouth daily. 05/13/15   Loleta Roseory Ripley Lovecchio, MD  Lisdexamfetamine Dimesylate (VYVANSE) 10 MG CAPS Take 10 mg by  mouth daily.    Historical Provider, MD  norgestimate-ethinyl estradiol (ORTHO-CYCLEN,SPRINTEC,PREVIFEM) 0.25-35 MG-MCG tablet Take 1 tablet by mouth daily. 05/13/15   Loleta Roseory Ilya Ess, MD  ondansetron [facility](ZOFRAN) 4 MG tablet Take 1-2 tabs by mouth every 8 hours as needed for nausea/vomiting 07/25/16   Loleta Roseory Seniya Stoffers, MD  ranitidine (ZANTAC) 75 MG tablet Take 1 tablet (75 mg total) by mouth 2 (two) times daily. 10/12/15   Myrna Blazeravid Matthew Schaevitz, MD    Allergies Patient has no known allergies.  History reviewed. No pertinent family history.  Social History Social History  Substance Use Topics  . Smoking status: Never Smoker  . Smokeless tobacco: Never Used  . Alcohol use No    Review of Systems Constitutional: No fever/chills Eyes: No visual changes. ENT: No sore throat. Cardiovascular: Denies chest pain. Respiratory: Denies shortness of breath. Gastrointestinal: Mild generalized abdominal pain.  Nausea, no vomiting.  A few loose stools.  No constipation. Genitourinary: Negative for dysuria. Musculoskeletal: Negative for back pain. Skin: Negative for rash. Neurological: Negative for headaches, focal weakness or numbness.  10-point ROS otherwise negative.  ____________________________________________   PHYSICAL EXAM:  VITAL SIGNS: ED Triage Vitals  Enc Vitals Group     BP 07/24/16 2124 128/76     Pulse Rate 07/24/16 2124 (!) 101     Resp 07/24/16 2124 18     Temp 07/24/16 2124 98.1 F (36.7 C)     Temp Source 07/24/16 2124 Oral     SpO2 07/24/16 2124 100 %  Weight 07/24/16 2123 260 lb (117.9 kg)     Height 07/24/16 2123 5\' 3"  (1.6 m)     Head Circumference --      Peak Flow --      Pain Score 07/24/16 2138 9     Pain Loc --      Pain Edu? --      Excl. in GC? --     Constitutional: Alert and oriented. Well appearing and in no acute distress. Very friendly and in good spirits, playing on her phone when I came into the room Eyes: Conjunctivae are normal. PERRL.  EOMI. Head: Atraumatic. Nose: No congestion/rhinnorhea. Mouth/Throat: Mucous membranes are moist.  Oropharynx non-erythematous. Neck: No stridor.  No meningeal signs.   Cardiovascular: Normal rate, regular rhythm. Good peripheral circulation. Grossly normal heart sounds. Respiratory: Normal respiratory effort.  No retractions. Lungs CTAB. Gastrointestinal: Obese, Soft with diffuse mild abdominal tenderness throughout without any focal tenderness of the right upper quadrant, epigastrium, nor right lower quadrant.  Nondistended. Musculoskeletal: No lower extremity tenderness nor edema. No gross deformities of extremities. Neurologic:  Normal speech and language. No gross focal neurologic deficits are appreciated.  Skin:  Skin is warm, dry and intact. No rash noted. Psychiatric: Mood and affect are normal. Speech and behavior are normal.  ____________________________________________   LABS (all labs ordered are listed, but only abnormal results are displayed)  Labs Reviewed  CBC - Abnormal; Notable for the following:       Result Value   Hemoglobin 11.5 (*)    MCH 25.0 (*)    MCHC 31.2 (*)    RDW 17.1 (*)    All other components within normal limits  URINALYSIS COMPLETEWITH MICROSCOPIC ([facility] ONLY) - Abnormal; Notable for the following:    Color, Urine YELLOW (*)    APPearance CLEAR (*)    Protein, ur 30 (*)    Leukocytes, UA 3+ (*)    Bacteria, UA RARE (*)    Squamous Epithelial / LPF 0-5 (*)    All other components within normal limits  URINE CULTURE  LIPASE, BLOOD  COMPREHENSIVE METABOLIC PANEL  POCT PREGNANCY, URINE  POC URINE PREG, ED   ____________________________________________  EKG  None - EKG not ordered by ED physician ____________________________________________  RADIOLOGY   No results found.  ____________________________________________   PROCEDURES  Procedure(s) performed:   Procedures   Critical Care performed:  No ____________________________________________   INITIAL IMPRESSION / ASSESSMENT AND PLAN / ED COURSE  Pertinent labs & imaging results that were available during my care of the patient were reviewed by me and considered in my medical decision making (see chart for details).  Very well-appearing and in no acute distress.  The initial triage borderline tachycardia has resolved.  Labs are unremarkable.  Almost identical presentation to her visit last month including a few white blood cells in her urine.  Her urine culture at that time suggested a contaminant and I will send another culture this time but did not treat empirically given that she has no urinary symptoms.  I discussed everything with her and we agreed that I would give her another Bentyl at this time and will refill her prescription.  I will also give her a prescription for Zofran if she continues to have nausea.  Her medical screening exam is reassuring at this time and I do not suspect an emergent/acute medical condition at this time.  Viral GI illness is possible but she is very comfortable and I think she will be  appropriate for outpatient follow-up.   ____________________________________________  FINAL CLINICAL IMPRESSION(S) / ED DIAGNOSES  Final diagnoses:  Generalized abdominal pain  Nausea     MEDICATIONS GIVEN DURING THIS VISIT:  Medications  dicyclomine (BENTYL) capsule 10 mg (not administered)     NEW OUTPATIENT MEDICATIONS STARTED DURING THIS VISIT:  New Prescriptions   ONDANSETRON (ZOFRAN) 4 MG TABLET    Take 1-2 tabs by mouth every 8 hours as needed for nausea/vomiting    Modified Medications   Modified Medication Previous Medication   DICYCLOMINE (BENTYL) 20 MG TABLET dicyclomine (BENTYL) 20 MG tablet      Take 1 tablet (20 mg total) by mouth 3 (three) times daily as needed for spasms (Abdominal pain).    Take 1 tablet (20 mg total) by mouth 3 (three) times daily as needed for spasms (Abdominal pain).     Discontinued Medications   No medications on file     Note:  This document was prepared using Dragon voice recognition software and may include unintentional dictation errors.    Loleta Rose, MD 07/25/16 [phone]

## 2016-07-26 LAB — URINE CULTURE
Culture: NO GROWTH
Special Requests: NORMAL

## 2016-08-21 ENCOUNTER — Emergency Department
Admission: EM | Admit: 2016-08-21 | Discharge: 2016-08-21 | Disposition: A | Discharge status: Home / Self Care | Payer: Medicaid Other | Attending: Emergency Medicine | Admitting: Emergency Medicine

## 2016-08-21 ENCOUNTER — Encounter: Payer: Self-pay | Admitting: Emergency Medicine

## 2016-08-21 DIAGNOSIS — R197 Diarrhea, unspecified: Secondary | ICD-10-CM | POA: Diagnosis not present

## 2016-08-21 DIAGNOSIS — Z79899 Other long term (current) drug therapy: Secondary | ICD-10-CM | POA: Diagnosis not present

## 2016-08-21 LAB — URINALYSIS, COMPLETE (UACMP) WITH MICROSCOPIC
Bilirubin Urine: NEGATIVE
Glucose, UA: NEGATIVE mg/dL
Hgb urine dipstick: NEGATIVE
Ketones, ur: NEGATIVE mg/dL
Nitrite: NEGATIVE
Protein, ur: 30 mg/dL — AB
SPECIFIC GRAVITY, URINE: 1.031 — AB (ref 1.005–1.030)
pH: 5 (ref 5.0–8.0)

## 2016-08-21 LAB — CBC
HEMATOCRIT: 34.8 % — AB (ref 35.0–47.0)
Hemoglobin: 11 g/dL — ABNORMAL LOW (ref 12.0–16.0)
MCH: 25.5 pg — ABNORMAL LOW (ref 26.0–34.0)
MCHC: 31.7 g/dL — AB (ref 32.0–36.0)
MCV: 80.5 fL (ref 80.0–100.0)
Platelets: 355 10*3/uL (ref 150–440)
RBC: 4.33 MIL/uL (ref 3.80–5.20)
RDW: 17.2 % — AB (ref 11.5–14.5)
WBC: 7.4 10*3/uL (ref 3.6–11.0)

## 2016-08-21 LAB — COMPREHENSIVE METABOLIC PANEL
ALBUMIN: 4 g/dL (ref 3.5–5.0)
ALT: 18 U/L (ref 14–54)
AST: 21 U/L (ref 15–41)
Alkaline Phosphatase: 75 U/L (ref 38–126)
Anion gap: 6 (ref 5–15)
BILIRUBIN TOTAL: 0.3 mg/dL (ref 0.3–1.2)
BUN: 13 mg/dL (ref 6–20)
CO2: 25 mmol/L (ref 22–32)
Calcium: 9.1 mg/dL (ref 8.9–10.3)
Chloride: 108 mmol/L (ref 101–111)
Creatinine, Ser: 0.98 mg/dL (ref 0.44–1.00)
GFR calc Af Amer: >60 mL/min (ref >60)
GFR calc non Af Amer: >60 mL/min (ref >60)
GLUCOSE: 93 mg/dL (ref 65–99)
POTASSIUM: 3.4 mmol/L — AB (ref 3.5–5.1)
Sodium: 139 mmol/L (ref 135–145)
TOTAL PROTEIN: 7.6 g/dL (ref 6.5–8.1)

## 2016-08-21 LAB — LIPASE, BLOOD: Lipase: 17 U/L (ref 11–51)

## 2016-08-21 LAB — PREGNANCY, URINE: PREG TEST UR: NEGATIVE

## 2016-08-21 NOTE — ED Provider Notes (Signed)
[facility] Emergency Department Provider Note  ____________________________________________   I have reviewed the triage vital signs and the nursing notes.   HISTORY  Chief Complaint Abdominal Pain    HPI Julie Mcclain is a 20 y.o. female who states she thinks she ate something that caused  diarrhea. She's had for 5 loose stools since yesterday. Denies any other symptoms. No vomiting no nausea no headache, no weakness no dizziness, no melena bright red blood per rectum, feels otherwise "pretty good. Patient tech sting on her telephone while I talk to her. She states she had some cramping she is in the physical active having diarrhea but she is no longer having it. She has no abdominal pain or discomfort. Denies fever.      Past Medical History:  Diagnosis Date  . Menometrorrhagia   . Obesity     There are no active problems to display for this patient.   History reviewed. No pertinent surgical history.  Prior to Admission medications   Medication Sig Start Date End Date Taking? Authorizing Provider  brompheniramine-pseudoephedrine-DM 30-2-10 MG/5ML syrup Take 10 mLs by mouth 4 (four) times daily as needed. 01/03/16   Jami L Hagler, PA-C  dicyclomine (BENTYL) 20 MG tablet Take 1 tablet (20 mg total) by mouth 3 (three) times daily as needed for spasms (Abdominal pain). 07/25/16 07/25/17  Loleta Roseory Forbach, MD  Ferrous Fumarate 90 MG TABS Take 1 tablet (90 mg total) by mouth daily. 05/13/15   Loleta Roseory Forbach, MD  Lisdexamfetamine Dimesylate (VYVANSE) 10 MG CAPS Take 10 mg by mouth daily.    Historical Provider, MD  norgestimate-ethinyl estradiol (ORTHO-CYCLEN,SPRINTEC,PREVIFEM) 0.25-35 MG-MCG tablet Take 1 tablet by mouth daily. 05/13/15   Loleta Roseory Forbach, MD  ondansetron [facility](ZOFRAN) 4 MG tablet Take 1-2 tabs by mouth every 8 hours as needed for nausea/vomiting 07/25/16   Loleta Roseory Forbach, MD  ranitidine (ZANTAC) 75 MG tablet Take 1 tablet (75 mg total) by mouth 2 (two) times  daily. 10/12/15   Myrna Blazeravid Matthew Schaevitz, MD    Allergies Patient has no known allergies.  No family history on file.  Social History Social History  Substance Use Topics  . Smoking status: Never Smoker  . Smokeless tobacco: Never Used  . Alcohol use No    Review of Systems Constitutional: No fever/chills Eyes: No visual changes. ENT: No sore throat. No stiff neck no neck pain Cardiovascular: Denies chest pain. Respiratory: Denies shortness of breath. Gastrointestinal:   no vomiting.  Positive diarrhea.  No constipation. Genitourinary: Negative for dysuria. Musculoskeletal: Negative lower extremity swelling Skin: Negative for rash. Neurological: Negative for severe headaches, focal weakness or numbness. 10-point ROS otherwise negative.  ____________________________________________   PHYSICAL EXAM:  VITAL SIGNS: ED Triage Vitals  Enc Vitals Group     BP 08/21/16 1123 134/74     Pulse Rate 08/21/16 1123 99     Resp 08/21/16 1123 16     Temp 08/21/16 1123 97.5 F (36.4 C)     Temp Source 08/21/16 1123 Oral     SpO2 08/21/16 1123 98 %     Weight 08/21/16 1120 253 lb (114.8 kg)     Height 08/21/16 1120 5\' 3"  (1.6 m)     Head Circumference --      Peak Flow --      Pain Score 08/21/16 1120 8     Pain Loc --      Pain Edu? --      Excl. in GC? --  Constitutional: Alert and oriented. Well appearing and in no acute distress. Eyes: Conjunctivae are normal. PERRL. EOMI. Head: Atraumatic. Nose: No congestion/rhinnorhea. Mouth/Throat: Mucous membranes are moist.  Oropharynx non-erythematous. Neck: No stridor.   Nontender with no meningismus Cardiovascular: Normal rate, regular rhythm. Grossly normal heart sounds.  Good peripheral circulation. Respiratory: Normal respiratory effort.  No retractions. Lungs CTAB. Abdominal: Soft and nontender. No distention. No guarding no rebound Back:  There is no focal tenderness or step off.  there is no midline tenderness there  are no lesions noted. there is no CVA tenderness Musculoskeletal: No lower extremity tenderness, no upper extremity tenderness. No joint effusions, no DVT signs strong distal pulses no edema Neurologic:  Normal speech and language. No gross focal neurologic deficits are appreciated.  Skin:  Skin is warm, dry and intact. No rash noted. Psychiatric: Mood and affect are normal. Speech and behavior are normal.  ____________________________________________   LABS (all labs ordered are listed, but only abnormal results are displayed)  Labs Reviewed  COMPREHENSIVE METABOLIC PANEL - Abnormal; Notable for the following:       Result Value   Potassium 3.4 (*)    All other components within normal limits  CBC - Abnormal; Notable for the following:    Hemoglobin 11.0 (*)    HCT 34.8 (*)    MCH 25.5 (*)    MCHC 31.7 (*)    RDW 17.2 (*)    All other components within normal limits  URINALYSIS, COMPLETE (UACMP) WITH MICROSCOPIC - Abnormal; Notable for the following:    Color, Urine YELLOW (*)    APPearance HAZY (*)    Specific Gravity, Urine 1.031 (*)    Protein, ur 30 (*)    Leukocytes, UA SMALL (*)    Bacteria, UA RARE (*)    Squamous Epithelial / LPF 0-5 (*)    All other components within normal limits  LIPASE, BLOOD  POC URINE PREG, ED   ____________________________________________  EKG  I personally interpreted any EKGs ordered by me or triage  ____________________________________________  RADIOLOGY  I reviewed any imaging ordered by me or triage that were performed during my shift and, if possible, patient and/or family made aware of any abnormal findings. ____________________________________________   PROCEDURES  Procedure(s) performed: None  Procedures  Critical Care performed: None  ____________________________________________   INITIAL IMPRESSION / ASSESSMENT AND PLAN / ED COURSE  Pertinent labs & imaging results that were available during my care of the  patient were reviewed by me and considered in my medical decision making (see chart for details).  Patient here with a completely benign abdomen, no evidence of ongoing discomfort, no evidence of actual illness present on exam, reassuringly. Patient does have diarrhea, unclear exactly what the source is. Clearly require likely viral, mild food poisoning also on the differential. However does not require IV fluids, no evidence of dehydration, taking by mouth well, no evidence of obstruction or diverticulitis, ovarian torsion or ovarian cyst, no evidence of any other acute intra-abdominal pathology such as gallbladder disease or appendicitis. Extensive return precautions given and understood.  Clinical Course    ____________________________________________   FINAL CLINICAL IMPRESSION(S) / ED DIAGNOSES  Final diagnoses:  None      This chart was dictated using voice recognition software.  Despite best efforts to proofread,  errors can occur which can change meaning.      Jeanmarie PlantJames A Akita Maxim, MD 08/21/16 1245

## 2016-08-21 NOTE — ED Triage Notes (Signed)
Patient to ED via POV for generalized abdominal pain that started 2 days ago. Patient states that she has also had diarrhea. Patient denies N/V.

## 2016-08-21 NOTE — ED Notes (Signed)
Bedside poct preg negative  

## 2017-01-23 IMAGING — CR DG CHEST 2V
1 series · 2 of 2 positions shown · non-contrast
Comparison: None.

CLINICAL DATA: Epigastric pain

EXAM:
CHEST  2 VIEW

[Series 1: dg chest 2 view · 0.14mm/px · 2 of 2 slices shown]
[im 1/2]
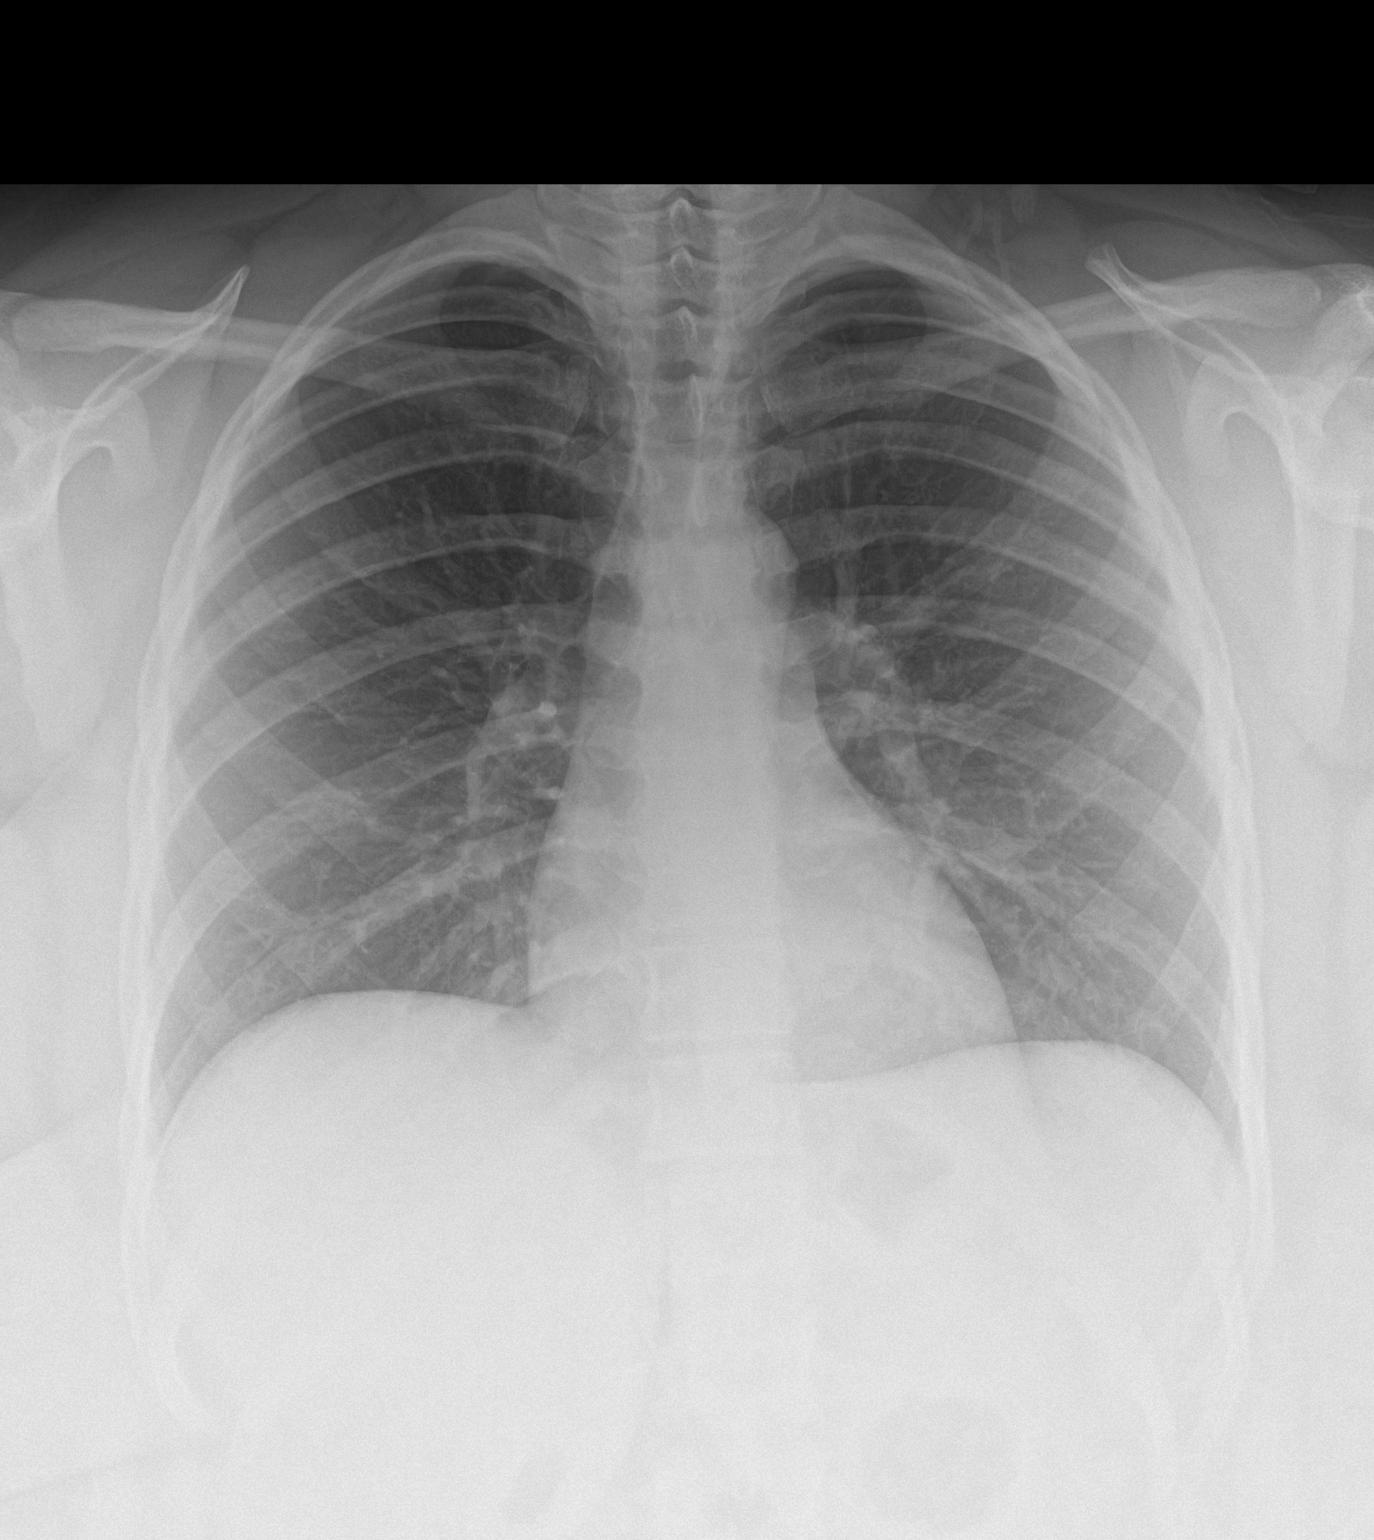
[im 2/2]
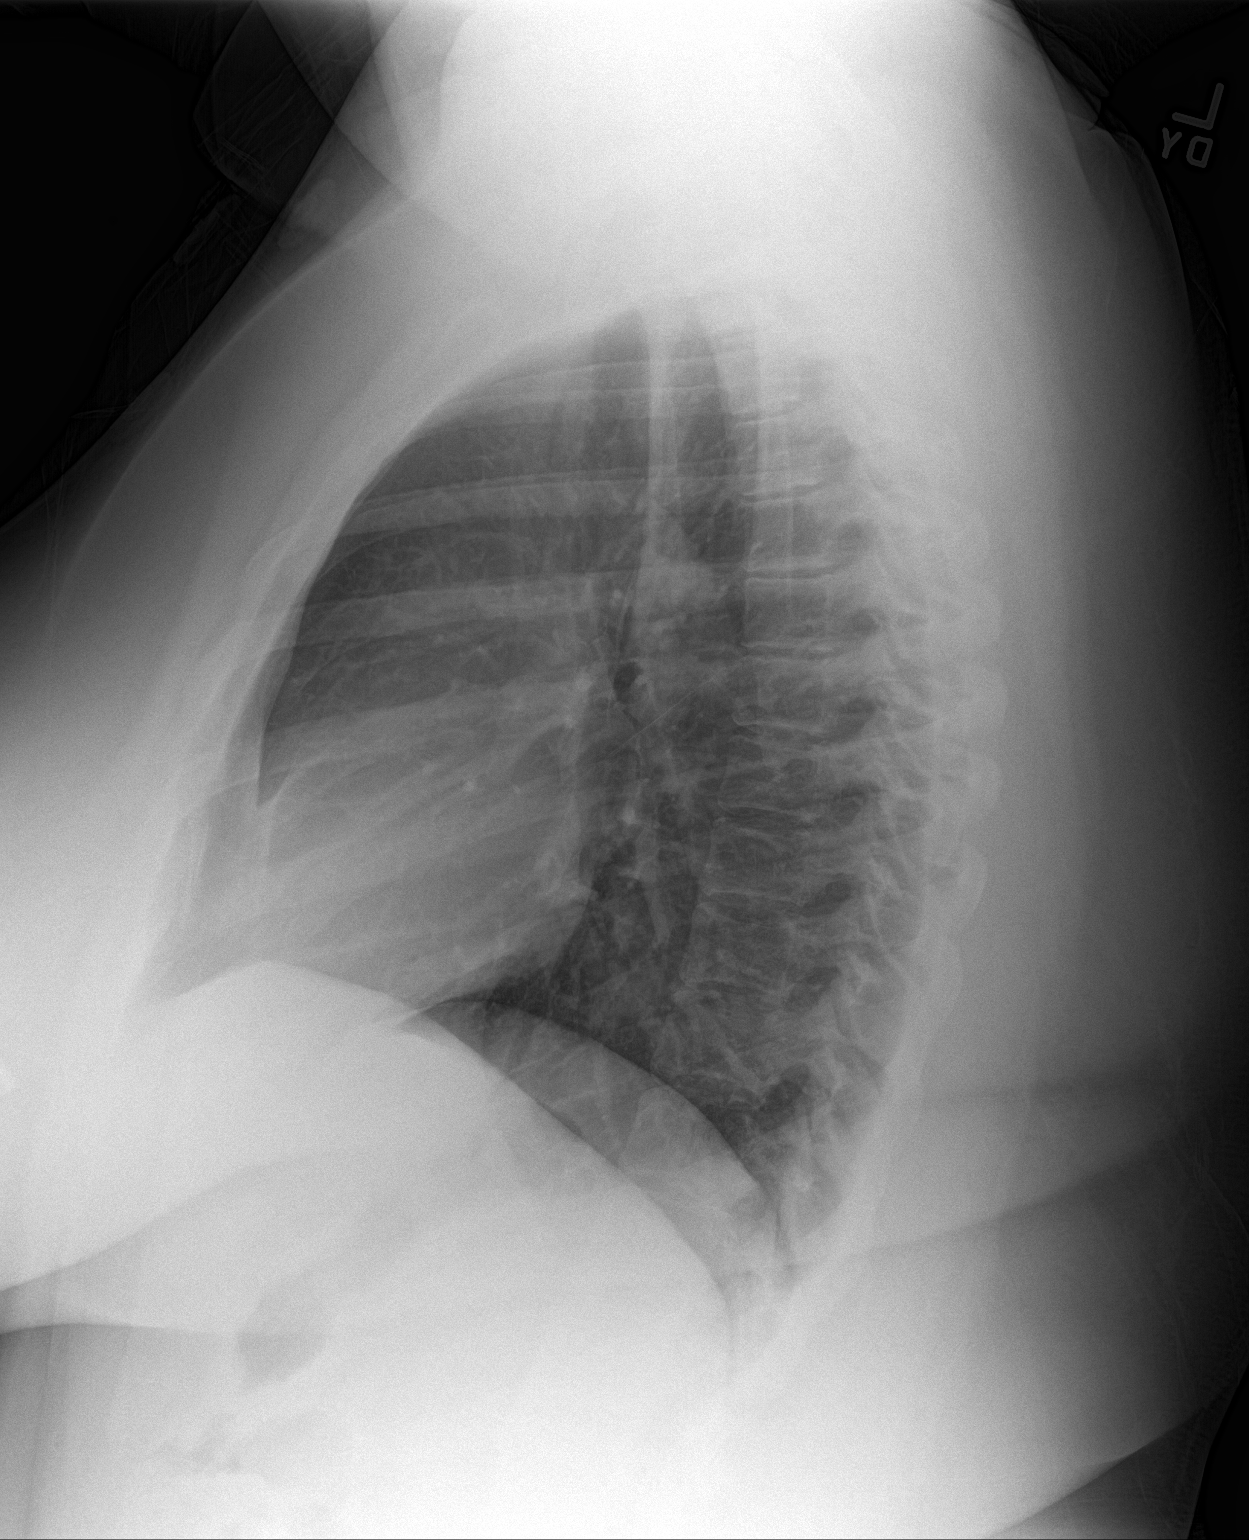

[2 of 2 positions shown; findings below may reference images not displayed]

FINDINGS: The heart size and mediastinal contours are within normal limits.
Both lungs are clear. The visualized skeletal structures are
unremarkable.
IMPRESSION: No active cardiopulmonary disease.

## 2017-03-07 ENCOUNTER — Emergency Department
Admission: EM | Admit: 2017-03-07 | Discharge: 2017-03-07 | Disposition: A | Discharge status: Home / Self Care | Payer: Medicaid Other | Attending: Emergency Medicine | Admitting: Emergency Medicine

## 2017-03-07 ENCOUNTER — Encounter: Payer: Self-pay | Admitting: Emergency Medicine

## 2017-03-07 DIAGNOSIS — S61309A Unspecified open wound of unspecified finger with damage to nail, initial encounter: Secondary | ICD-10-CM | POA: Insufficient documentation

## 2017-03-07 DIAGNOSIS — S6992XA Unspecified injury of left wrist, hand and finger(s), initial encounter: Secondary | ICD-10-CM

## 2017-03-07 DIAGNOSIS — Y9389 Activity, other specified: Secondary | ICD-10-CM | POA: Diagnosis not present

## 2017-03-07 DIAGNOSIS — Z79899 Other long term (current) drug therapy: Secondary | ICD-10-CM | POA: Diagnosis not present

## 2017-03-07 DIAGNOSIS — Y9289 Other specified places as the place of occurrence of the external cause: Secondary | ICD-10-CM | POA: Diagnosis not present

## 2017-03-07 DIAGNOSIS — W231XXA Caught, crushed, jammed, or pinched between stationary objects, initial encounter: Secondary | ICD-10-CM | POA: Diagnosis not present

## 2017-03-07 DIAGNOSIS — Y998 Other external cause status: Secondary | ICD-10-CM | POA: Diagnosis not present

## 2017-03-07 MED ORDER — LIDOCAINE HCL (PF) 1 % IJ SOLN
INTRAMUSCULAR | Status: AC
  Filled 2017-03-07: qty 5

## 2017-03-07 MED ORDER — LIDOCAINE HCL 1 % IJ SOLN: 5.0000 mL | Freq: Once | INTRAMUSCULAR | Status: DC

## 2017-03-07 MED ORDER — CEPHALEXIN 500 MG PO CAPS: 500.0000 mg | ORAL_CAPSULE | Freq: Three times a day (TID) | ORAL | 0 refills | Status: AC

## 2017-03-07 NOTE — ED Provider Notes (Signed)
[facility] Emergency Department Provider Note  ____________________________________________  Time seen: Approximately 6:22 PM  I have reviewed the triage vital signs and the nursing notes.   HISTORY  Chief Complaint Finger Injury    HPI Julie Mcclain is a 21 y.o. female presents to the emergency department after she caught her left first digit fingernail at [employer]. Patient states that fingernail became displaced and is only attached at the proximal nail bed. Patient states that she gets her "nails done" on a regular basis and would like nail removed. Patient denies weakness, radiculopathy or changes in sensation of the right thumb. No alleviating measures have been attempted.   Past Medical History:  Diagnosis Date  . Menometrorrhagia   . Obesity     There are no active problems to display for this patient.   History reviewed. No pertinent surgical history.  Prior to Admission medications   Medication Sig Start Date End Date Taking? Authorizing Provider  brompheniramine-pseudoephedrine-DM 30-2-10 MG/5ML syrup Take 10 mLs by mouth 4 (four) times daily as needed. 01/03/16   Hagler, Jami L, PA-C  cephALEXin (KEFLEX) 500 MG capsule Take 1 capsule (500 mg total) by mouth 3 (three) times daily. 03/07/17 04/06/17  Orvil FeilWoods, Sapphira Harjo M, PA-C  dicyclomine (BENTYL) 20 MG tablet Take 1 tablet (20 mg total) by mouth 3 (three) times daily as needed for spasms (Abdominal pain). 07/25/16 07/25/17  Loleta RoseForbach, Cory, MD  Ferrous Fumarate 90 MG TABS Take 1 tablet (90 mg total) by mouth daily. 05/13/15   Loleta RoseForbach, Cory, MD  Lisdexamfetamine Dimesylate (VYVANSE) 10 MG CAPS Take 10 mg by mouth daily.    [provider]  norgestimate-ethinyl estradiol (ORTHO-CYCLEN,SPRINTEC,PREVIFEM) 0.25-35 MG-MCG tablet Take 1 tablet by mouth daily. 05/13/15   Loleta RoseForbach, Cory, MD  ondansetron [facility](ZOFRAN) 4 MG tablet Take 1-2 tabs by mouth every 8 hours as needed for nausea/vomiting 07/25/16    Loleta RoseForbach, Cory, MD  ranitidine (ZANTAC) 75 MG tablet Take 1 tablet (75 mg total) by mouth 2 (two) times daily. 10/12/15   Schaevitz, Myra Rudeavid Matthew, MD    Allergies Patient has no known allergies.  No family history on file.  Social History Social History  Substance Use Topics  . Smoking status: Never Smoker  . Smokeless tobacco: Never Used  . Alcohol use No     Review of Systems  Constitutional: No fever/chills Eyes: No visual changes. No discharge ENT: No upper respiratory complaints. Cardiovascular: no chest pain. Respiratory: no cough. No SOB. Musculoskeletal: Negative for musculoskeletal pain. Skin:Patient has displaced left thumb fingernail.  Neurological: Negative for headaches, focal weakness or numbness.   ____________________________________________   PHYSICAL EXAM:  VITAL SIGNS: ED Triage Vitals  Enc Vitals Group     BP 03/07/17 1734 128/81     Pulse Rate 03/07/17 1734 (!) 124     Resp 03/07/17 1734 18     Temp 03/07/17 1734 98.8 F (37.1 C)     Temp Source 03/07/17 1734 Oral     SpO2 03/07/17 1734 98 %     Weight 03/07/17 1735 250 lb (113.4 kg)     Height 03/07/17 1735 5\' 3"  (1.6 m)     Head Circumference --      Peak Flow --      Pain Score 03/07/17 1738 7     Pain Loc --      Pain Edu? --      Excl. in GC? --      Constitutional: Alert and oriented. Well appearing and in  no acute distress. Eyes: Conjunctivae are normal. PERRL. EOMI. Head: Atraumatic. Cardiovascular: Normal rate, regular rhythm. Normal S1 and S2.  Good peripheral circulation. Respiratory: Normal respiratory effort without tachypnea or retractions. Lungs CTAB. Good air entry to the bases with no decreased or absent breath sounds. Musculoskeletal: Full range of motion to all extremities. No gross deformities appreciated. Neurologic:  Normal speech and language. No gross focal neurologic deficits are appreciated.  Skin: Patient has displaced left thumb fingernail Psychiatric: Mood  and affect are normal. Speech and behavior are normal. Patient exhibits appropriate insight and judgement.   ____________________________________________   LABS (all labs ordered are listed, but only abnormal results are displayed)  Labs Reviewed - No data to display ____________________________________________  EKG   ____________________________________________  RADIOLOGY   No results found.  ____________________________________________    PROCEDURES  Procedure(s) performed:    Procedures  Fingernail removal  Performed by: Orvil Feil Authorized by: Orvil Feil Consent: Verbal consent obtained. Risks and benefits: risks, benefits and alternatives were discussed Consent given by: patient Patient identity confirmed: provided demographic data Prepped and Draped in normal sterile fashion Wound explored  Local anesthetic: lidocaine 1% without epinephrine  Right first digit fingernail was removed.  Patient tolerance: Patient tolerated the procedure well with no immediate complications.   Medications  lidocaine (XYLOCAINE) 1 % (with pres) injection 5 mL (not administered)     ____________________________________________   INITIAL IMPRESSION / ASSESSMENT AND PLAN / ED COURSE  Pertinent labs & imaging results that were available during my care of the patient were reviewed by me and considered in my medical decision making (see chart for details).  Review of the [place] CSRS was performed in accordance of the [facility] prior to dispensing any controlled drugs.    Assessment and Plan: Injury of fingernail of left hand, initial encounter  Patient's diagnosis is consistent with left first digit finger nail removal. Patient tolerated procedure without complication. Patient will be discharged home with prescriptions for Keflex. Patient is to follow up with primary care as needed or otherwise directed. Patient is given ED precautions to return to the ED for any  worsening or new symptoms. All patient questions were answered     ____________________________________________  FINAL CLINICAL IMPRESSION(S) / ED DIAGNOSES  Final diagnoses:  Injury to fingernail of left hand, initial encounter      NEW MEDICATIONS STARTED DURING THIS VISIT:  Discharge Medication List as of 03/07/2017  6:20 PM    START taking these medications   Details  cephALEXin (KEFLEX) 500 MG capsule Take 1 capsule (500 mg total) by mouth 3 (three) times daily., Starting Mon 03/07/2017, Until Wed 04/06/2017, Print            This chart was dictated using voice recognition software/Dragon. Despite best efforts to proofread, errors can occur which can change the meaning. Any change was purely unintentional.    Orvil Feil, PA-C 03/07/17 1839    Rockne Menghini, MD 03/07/17 1901

## 2017-03-07 NOTE — ED Triage Notes (Signed)
States she caught her finger and bent the nail back

## 2017-04-12 ENCOUNTER — Emergency Department
Admission: EM | Admit: 2017-04-12 | Discharge: 2017-04-12 | Disposition: A | Discharge status: Home / Self Care | Payer: Medicaid Other | Attending: Emergency Medicine | Admitting: Emergency Medicine

## 2017-04-12 ENCOUNTER — Encounter: Payer: Self-pay | Admitting: Emergency Medicine

## 2017-04-12 DIAGNOSIS — N920 Excessive and frequent menstruation with regular cycle: Secondary | ICD-10-CM | POA: Insufficient documentation

## 2017-04-12 DIAGNOSIS — Z79899 Other long term (current) drug therapy: Secondary | ICD-10-CM | POA: Insufficient documentation

## 2017-04-12 DIAGNOSIS — N938 Other specified abnormal uterine and vaginal bleeding: Secondary | ICD-10-CM | POA: Diagnosis present

## 2017-04-12 LAB — COMPREHENSIVE METABOLIC PANEL
ALT: 18 U/L (ref 14–54)
ANION GAP: 7 (ref 5–15)
AST: 23 U/L (ref 15–41)
Albumin: 4 g/dL (ref 3.5–5.0)
Alkaline Phosphatase: 78 U/L (ref 38–126)
BUN: 13 mg/dL (ref 6–20)
CHLORIDE: 105 mmol/L (ref 101–111)
CO2: 27 mmol/L (ref 22–32)
CREATININE: 1.03 mg/dL — AB (ref 0.44–1.00)
Calcium: 9.1 mg/dL (ref 8.9–10.3)
GFR calc non Af Amer: >60 mL/min (ref >60)
Glucose, Bld: 95 mg/dL (ref 65–99)
POTASSIUM: 3.9 mmol/L (ref 3.5–5.1)
SODIUM: 139 mmol/L (ref 135–145)
Total Bilirubin: 0.3 mg/dL (ref 0.3–1.2)
Total Protein: 7.7 g/dL (ref 6.5–8.1)

## 2017-04-12 LAB — CBC
HCT: 34.9 % — ABNORMAL LOW (ref 35.0–47.0)
Hemoglobin: 11.3 g/dL — ABNORMAL LOW (ref 12.0–16.0)
MCH: 27.1 pg (ref 26.0–34.0)
MCHC: 32.3 g/dL (ref 32.0–36.0)
MCV: 83.7 fL (ref 80.0–100.0)
PLATELETS: 419 10*3/uL (ref 150–440)
RBC: 4.17 MIL/uL (ref 3.80–5.20)
RDW: 18.2 % — AB (ref 11.5–14.5)
WBC: 11.6 10*3/uL — ABNORMAL HIGH (ref 3.6–11.0)

## 2017-04-12 LAB — HCG, QUANTITATIVE, PREGNANCY

## 2017-04-12 LAB — POCT PREGNANCY, URINE: Preg Test, Ur: NEGATIVE

## 2017-04-12 MED ORDER — ONDANSETRON HCL 4 MG/2ML IJ SOLN
4.0000 mg | Freq: Once | INTRAMUSCULAR | Status: DC
  Filled 2017-04-12: qty 2

## 2017-04-12 MED ORDER — SODIUM CHLORIDE 0.9 % IV BOLUS (SEPSIS): 1000.0000 mL | Freq: Once | INTRAVENOUS | Status: DC

## 2017-04-12 MED ORDER — KETOROLAC TROMETHAMINE 30 MG/ML IJ SOLN
15.0000 mg | Freq: Once | INTRAMUSCULAR | Status: DC
  Filled 2017-04-12: qty 1

## 2017-04-12 NOTE — ED Notes (Signed)
No iv or meds given.  Pt had to leave due to car ride issues.  Pt alert  md aware.

## 2017-04-12 NOTE — ED Notes (Signed)
ED Provider at bedside. 

## 2017-04-12 NOTE — ED Triage Notes (Signed)
Pt reports started period today, having heavier bleeding and passing clots than normal. Pt also feels like she is running a fever.

## 2017-04-12 NOTE — ED Provider Notes (Signed)
[facility] Emergency Department Provider Note  ____________________________________________  Time seen: Approximately 3:30 PM  I have reviewed the triage vital signs and the nursing notes.   HISTORY  Chief Complaint Vaginal Bleeding   HPI Julie Mcclain is a 21 y.o. female history of menometrorrhagiawho presents for evaluation of vaginal bleeding. Patient reports that her period started today and she has been passing large clots. She has been changing her pads once every 2 hours. She says all her periods are white this one. She usually has periods once a month and the last 7 days. Today she started feeling nauseated and dizzy especially when she stood up which prompted her visit to the emergency room. She recently moved to this area and does not have an OB/GYN. She does not take any birth control. She also complaining of mild to moderate abdominal cramping located in the suprapubic region, intermittent and nonradiating. Patient reports that she felt hot earlier today and thought she had a fever. No cough, congestion, headache, neck stiffness, sore throat, vomiting, diarrhea, dysuria, vaginal bleeding, chest pain or shortness of breath.   Past Medical History:  Diagnosis Date  . Menometrorrhagia   . Obesity     There are no active problems to display for this patient.   No past surgical history on file.  Prior to Admission medications   Medication Sig Start Date End Date Taking? Authorizing Provider  ARIPiprazole (ABILIFY) 15 MG tablet Take 1 tablet by mouth daily. 03/22/17  Yes [provider]  buPROPion (WELLBUTRIN XL) 300 MG 24 hr tablet Take 1 tablet by mouth daily. 03/22/17  Yes [provider]  busPIRone (BUSPAR) 10 MG tablet Take 1 tablet by mouth 2 (two) times daily. 03/22/17  Yes [provider]  lamoTRIgine (LAMICTAL) 200 MG tablet Take 1 tablet by mouth daily. 03/22/17  Yes [provider]  VYVANSE 50 MG capsule  Take 1 capsule by mouth daily. 03/22/17  Yes [provider]  brompheniramine-pseudoephedrine-DM 30-2-10 MG/5ML syrup Take 10 mLs by mouth 4 (four) times daily as needed. Patient not taking: Reported on 04/12/2017 01/03/16   Hagler, Jami L, PA-C  dicyclomine (BENTYL) 20 MG tablet Take 1 tablet (20 mg total) by mouth 3 (three) times daily as needed for spasms (Abdominal pain). Patient not taking: Reported on 04/12/2017 07/25/16 07/25/17  Loleta RoseForbach, Cory, MD  Ferrous Fumarate 90 MG TABS Take 1 tablet (90 mg total) by mouth daily. Patient not taking: Reported on 04/12/2017 05/13/15   Loleta RoseForbach, Cory, MD  norgestimate-ethinyl estradiol (ORTHO-CYCLEN,SPRINTEC,PREVIFEM) 0.25-35 MG-MCG tablet Take 1 tablet by mouth daily. 05/13/15   Loleta RoseForbach, Cory, MD  ondansetron (ZOFRAN) 4 MG tablet Take 1-2 tabs by mouth every 8 hours as needed for nausea/vomiting Patient not taking: Reported on 04/12/2017 07/25/16   Loleta RoseForbach, Cory, MD  ranitidine (ZANTAC) 75 MG tablet Take 1 tablet (75 mg total) by mouth 2 (two) times daily. Patient not taking: Reported on 04/12/2017 10/12/15   Schaevitz, Myra Rudeavid Matthew, MD    Allergies Patient has no known allergies.  No family history on file.  Social History Social History  Substance Use Topics  . Smoking status: Never Smoker  . Smokeless tobacco: Never Used  . Alcohol use No    Review of Systems  Constitutional: Negative for fever. + Lightheadedness Eyes: Negative for visual changes. ENT: Negative for sore throat. Neck: No neck pain  Cardiovascular: Negative for chest pain. Respiratory: Negative for shortness of breath. Gastrointestinal: + lower abdominal cramping and nausea. No vomiting  or diarrhea. Genitourinary: Negative for dysuria. + Vaginal bleeding Musculoskeletal: Negative for back pain. Skin: Negative for rash. Neurological: Negative for headaches, weakness or numbness. Psych: No SI or HI  ____________________________________________   PHYSICAL  EXAM:  VITAL SIGNS: ED Triage Vitals  Enc Vitals Group     BP 04/12/17 1456 109/75     Pulse Rate 04/12/17 1456 (!) 104     Resp 04/12/17 1456 15     Temp 04/12/17 1456 99.3 F (37.4 C)     Temp Source 04/12/17 1456 Oral     SpO2 04/12/17 1456 100 %     Weight 04/12/17 1456 250 lb (113.4 kg)     Height 04/12/17 1456 5\' 3"  (1.6 m)     Head Circumference --      Peak Flow --      Pain Score 04/12/17 1449 7     Pain Loc --      Pain Edu? --      Excl. in GC? --     Constitutional: Alert and oriented. Well appearing and in no apparent distress. HEENT:      Head: Normocephalic and atraumatic.         Eyes: Conjunctivae are normal. Sclera is non-icteric.       Mouth/Throat: Mucous membranes are moist.       Neck: Supple with no signs of meningismus. Cardiovascular: Tachycardic with regular rhythm. No murmurs, gallops, or rubs. 2+ symmetrical distal pulses are present in all extremities. No JVD. Respiratory: Normal respiratory effort. Lungs are clear to auscultation bilaterally. No wheezes, crackles, or rhonchi.  Gastrointestinal: Soft, non tender, and non distended with positive bowel sounds. No rebound or guarding. Genitourinary: No CVA tenderness. Musculoskeletal: Nontender with normal range of motion in all extremities. No edema, cyanosis, or erythema of extremities. Neurologic: Normal speech and language. Face is symmetric. Moving all extremities. No gross focal neurologic deficits are appreciated. Skin: Skin is warm, dry and intact. No rash noted. Psychiatric: Mood and affect are normal. Speech and behavior are normal.  ____________________________________________   LABS (all labs ordered are listed, but only abnormal results are displayed)  Labs Reviewed  CBC - Abnormal; Notable for the following:       Result Value   WBC 11.6 (*)    Hemoglobin 11.3 (*)    HCT 34.9 (*)    RDW 18.2 (*)    All other components within normal limits  COMPREHENSIVE METABOLIC PANEL -  Abnormal; Notable for the following:    Creatinine, Ser 1.03 (*)    All other components within normal limits  HCG, QUANTITATIVE, PREGNANCY  POCT PREGNANCY, URINE   ____________________________________________  EKG  none ____________________________________________  RADIOLOGY  none  ____________________________________________   PROCEDURES  Procedure(s) performed: None Procedures Critical Care performed:  None ____________________________________________   INITIAL IMPRESSION / ASSESSMENT AND PLAN / ED COURSE   21 y.o. female history of menometrorrhagiawho presents for evaluation of heavy vaginal bleeding since this am and lightheadedness. The patient is well-appearing, in no distress, initial temperature triage is 99.3. Repeat temperature upon my evaluation was 98.7 without any interventions. Patient is slightly tachycardic with a pulse of 104. She tells me that she hasn't eaten or drank anything today because of nausea. Urine pregnancy is negative however we'll check an hCG. Her hemoglobin is stable. We'll give her IV fluids, Toradol, and Zofran for cramps. We'll refer her to OB/GYN locally for evaluation as an outpatient. Patient has no abdominal tenderness and no clinical evidence of ovarian  pathology, appendicitis, or other acute intra-abdominal findings at this time.    _________________________ 3:51 PM on 04/12/2017 -----------------------------------------  Patient is refusing IVF and toradol because her mother called and she needs to go or she will not have a ride home. Recommended that she takes plenty of fluids at home, follow up with ObGYN and return to the ER if she feels faint, passes out, has localized abdominal pain or any new symptoms.   Pertinent labs & imaging results that were available during my care of the patient were reviewed by me and considered in my medical decision making (see chart for  details).    ____________________________________________   FINAL CLINICAL IMPRESSION(S) / ED DIAGNOSES  Final diagnoses:  Menorrhagia with regular cycle      NEW MEDICATIONS STARTED DURING THIS VISIT:  Discharge Medication List as of 04/12/2017  3:51 PM       Note:  This document was prepared using Dragon voice recognition software and may include unintentional dictation errors.    Nita Sickle, MD 04/12/17 2325

## 2017-04-12 NOTE — ED Notes (Signed)
Pt reports feeling lightheaded and heavy menses.  Pt states I just want all that stuff out of me.  Intermittent abd pain.  No n/v/d.  Pt alert.

## 2017-04-12 NOTE — ED Notes (Signed)
Pt states she is waiting on a car ride and needs to leave.  Pt decided not to have iv and meds.  md aware.

## 2017-05-08 ENCOUNTER — Emergency Department: Payer: Medicaid Other

## 2017-05-08 ENCOUNTER — Emergency Department
Admission: EM | Admit: 2017-05-08 | Discharge: 2017-05-08 | Disposition: A | Discharge status: Home / Self Care | Payer: Medicaid Other | Attending: Emergency Medicine | Admitting: Emergency Medicine

## 2017-05-08 ENCOUNTER — Encounter: Payer: Self-pay | Admitting: Emergency Medicine

## 2017-05-08 DIAGNOSIS — R1010 Upper abdominal pain, unspecified: Secondary | ICD-10-CM | POA: Insufficient documentation

## 2017-05-08 DIAGNOSIS — Z79899 Other long term (current) drug therapy: Secondary | ICD-10-CM | POA: Insufficient documentation

## 2017-05-08 LAB — POCT PREGNANCY, URINE: Preg Test, Ur: NEGATIVE

## 2017-05-08 LAB — CBC
HCT: 36.9 % (ref 35.0–47.0)
HEMOGLOBIN: 11.9 g/dL — AB (ref 12.0–16.0)
MCH: 26.8 pg (ref 26.0–34.0)
MCHC: 32.3 g/dL (ref 32.0–36.0)
MCV: 83 fL (ref 80.0–100.0)
PLATELETS: 429 10*3/uL (ref 150–440)
RBC: 4.44 MIL/uL (ref 3.80–5.20)
RDW: 17.8 % — ABNORMAL HIGH (ref 11.5–14.5)
WBC: 8.8 10*3/uL (ref 3.6–11.0)

## 2017-05-08 LAB — COMPREHENSIVE METABOLIC PANEL
ALK PHOS: 73 U/L (ref 38–126)
ALT: 16 U/L (ref 14–54)
ANION GAP: 6 (ref 5–15)
AST: 19 U/L (ref 15–41)
Albumin: 4.2 g/dL (ref 3.5–5.0)
BILIRUBIN TOTAL: 0.5 mg/dL (ref 0.3–1.2)
BUN: 9 mg/dL (ref 6–20)
CALCIUM: 9.4 mg/dL (ref 8.9–10.3)
CO2: 26 mmol/L (ref 22–32)
CREATININE: 0.88 mg/dL (ref 0.44–1.00)
Chloride: 106 mmol/L (ref 101–111)
Glucose, Bld: 98 mg/dL (ref 65–99)
Potassium: 3.7 mmol/L (ref 3.5–5.1)
Sodium: 138 mmol/L (ref 135–145)
TOTAL PROTEIN: 8 g/dL (ref 6.5–8.1)

## 2017-05-08 LAB — URINALYSIS, COMPLETE (UACMP) WITH MICROSCOPIC
BACTERIA UA: NONE SEEN
BILIRUBIN URINE: NEGATIVE
GLUCOSE, UA: NEGATIVE mg/dL
Ketones, ur: NEGATIVE mg/dL
NITRITE: NEGATIVE
PH: 5 (ref 5.0–8.0)
Protein, ur: 30 mg/dL — AB
SPECIFIC GRAVITY, URINE: 1.041 — AB (ref 1.005–1.030)

## 2017-05-08 LAB — LIPASE, BLOOD: Lipase: 16 U/L (ref 11–51)

## 2017-05-08 MED ORDER — IOPAMIDOL (ISOVUE-300) INJECTION 61%
125.0000 mL | Freq: Once | INTRAVENOUS | Status: AC | PRN
  Administered 2017-05-08: 100 mL via INTRAVENOUS

## 2017-05-08 MED ORDER — IOPAMIDOL (ISOVUE-300) INJECTION 61%
30.0000 mL | Freq: Once | INTRAVENOUS | Status: AC | PRN
  Administered 2017-05-08: 30 mL via ORAL

## 2017-05-08 MED ORDER — ESOMEPRAZOLE MAGNESIUM 40 MG PO CPDR: 40.0000 mg | DELAYED_RELEASE_CAPSULE | Freq: Every day | ORAL | 1 refills | Status: DC

## 2017-05-08 MED ORDER — ALUM & MAG HYDROXIDE-SIMETH 200-200-20 MG/5ML PO SUSP
30.0000 mL | Freq: Once | ORAL | Status: AC
  Administered 2017-05-08: 30 mL via ORAL
  Filled 2017-05-08: qty 30

## 2017-05-08 NOTE — ED Triage Notes (Signed)
Pt c/o abdominal pain for the past week. Pt states her pain is 10/10. Pt states she feels like her stomach is tight in the middle of her abdomen.  Denies any vomiting or diarrhea. Pt c/o nausea and headaches.

## 2017-05-08 NOTE — ED Notes (Signed)
Patient transported to CT 

## 2017-05-08 NOTE — Discharge Instructions (Signed)
The CAT scan looks like you may have an ulcer. Please return for worse pain fever vomiting or feeling sicker. Follow-up with your family doctor or you can try to get a doctor here. Take the Nexium 1 pill a day. If it's too expensive have the pharmacy call us while you're there, we can try something else.

## 2017-05-08 NOTE — ED Notes (Signed)
POC done but not enough for urinalysis. Pt aware need another sample.

## 2017-05-08 NOTE — ED Notes (Signed)
Pt drinking PO contrast. Aware to let RN know when done

## 2017-05-08 NOTE — ED Provider Notes (Signed)
[facility] Emergency Department Provider Note   ____________________________________________   First MD Initiated Contact with Patient 05/08/17 [phone]     (approximate)  I have reviewed the triage vital signs and the nursing notes.   HISTORY  Chief Complaint Abdominal Pain    HPI Julie Mcclain is a 21 y.o. female who complains of about one week of worsening abdominal pain. The pain feels like a tightness in the middle of her abdomen. She has some nausea but no vomiting or diarrhea. He told the nurse she had some headaches but was not having headache when I saw her. Really doesn't seem to get better or worse and just is constant nothing makes it change.   Past Medical History:  Diagnosis Date  . Menometrorrhagia   . Obesity     There are no active problems to display for this patient.   History reviewed. No pertinent surgical history.  Prior to Admission medications   Medication Sig Start Date End Date Taking? Authorizing Provider  ARIPiprazole (ABILIFY) 15 MG tablet Take 1 tablet by mouth daily. 03/22/17   [provider]  brompheniramine-pseudoephedrine-DM 30-2-10 MG/5ML syrup Take 10 mLs by mouth 4 (four) times daily as needed. Patient not taking: Reported on 04/12/2017 01/03/16   Hagler, Jami L, PA-C  buPROPion (WELLBUTRIN XL) 300 MG 24 hr tablet Take 1 tablet by mouth daily. 03/22/17   [provider]  busPIRone (BUSPAR) 10 MG tablet Take 1 tablet by mouth 2 (two) times daily. 03/22/17   [provider]  dicyclomine (BENTYL) 20 MG tablet Take 1 tablet (20 mg total) by mouth 3 (three) times daily as needed for spasms (Abdominal pain). Patient not taking: Reported on 04/12/2017 07/25/16 07/25/17  Loleta Rose, MD  esomeprazole (NEXIUM) 40 MG capsule Take 1 capsule (40 mg total) by mouth daily. 05/08/17 05/08/18  Arnaldo Natal, MD  Ferrous Fumarate 90 MG TABS Take 1 tablet (90 mg total) by mouth daily. Patient not taking:  Reported on 04/12/2017 05/13/15   Loleta Rose, MD  lamoTRIgine (LAMICTAL) 200 MG tablet Take 1 tablet by mouth daily. 03/22/17   [provider]  norgestimate-ethinyl estradiol (ORTHO-CYCLEN,SPRINTEC,PREVIFEM) 0.25-35 MG-MCG tablet Take 1 tablet by mouth daily. 05/13/15   Loleta Rose, MD  ondansetron (ZOFRAN) 4 MG tablet Take 1-2 tabs by mouth every 8 hours as needed for nausea/vomiting Patient not taking: Reported on 04/12/2017 07/25/16   Loleta Rose, MD  ranitidine (ZANTAC) 75 MG tablet Take 1 tablet (75 mg total) by mouth 2 (two) times daily. Patient not taking: Reported on 04/12/2017 10/12/15   Schaevitz, Myra Rude, MD  VYVANSE 50 MG capsule Take 1 capsule by mouth daily. 03/22/17   [provider]    Allergies Patient has no known allergies.  History reviewed. No pertinent family history.  Social History Social History  Substance Use Topics  . Smoking status: Never Smoker  . Smokeless tobacco: Never Used  . Alcohol use No    Review of Systems  Constitutional: No fever/chills Eyes: No visual changes. ENT: No sore throat. Cardiovascular: Denies chest pain. Respiratory: Denies shortness of breath. Gastrointestinal: No abdominal pain.  No nausea, no vomiting.  No diarrhea.  No constipation. Genitourinary: Negative for dysuria. Musculoskeletal: Negative for back pain. Skin: Negative for rash. Neurological: Negative for headaches, focal weakness  ____________________________________________   PHYSICAL EXAM:  VITAL SIGNS: ED Triage Vitals  Enc Vitals Group     BP 05/08/17 0751 128/90     Pulse Rate 05/08/17 0751  88     Resp 05/08/17 0751 20     Temp 05/08/17 0751 97.6 F (36.4 C)     Temp Source 05/08/17 0751 Oral     SpO2 05/08/17 0751 98 %     Weight 05/08/17 0748 265 lb (120.2 kg)     Height 05/08/17 0748 5\' 3"  (1.6 m)     Head Circumference --      Peak Flow --      Pain Score 05/08/17 0748 10     Pain Loc --      Pain Edu? --      Excl.  in GC? --     Constitutional: Alert and oriented. Well appearing and in no acute distress. Eyes: Conjunctivae are normal.  Head: Atraumatic. Nose: No congestion/rhinnorhea. Mouth/Throat: Mucous membranes are moist.  Oropharynx non-erythematous. Neck: No stridor. Cardiovascular: Normal rate, regular rhythm. Grossly normal heart sounds.  Good peripheral circulation. Respiratory: Normal respiratory effort.  No retractions. Lungs CTAB. Gastrointestinal: Soft tender in the mid abdomen around the umbilicus not in any other place. No distention. No abdominal bruits. No CVA tenderness. Musculoskeletal: No lower extremity tenderness nor edema.  No joint effusions. Neurologic:  Normal speech and language. No gross focal neurologic deficits are appreciated. No gait instability. Skin:  Skin is warm, dry and intact. No rash noted. Psychiatric: Mood and affect are normal. Speech and behavior are normal.  ____________________________________________   LABS (all labs ordered are listed, but only abnormal results are displayed)  Labs Reviewed  CBC - Abnormal; Notable for the following:       Result Value   Hemoglobin 11.9 (*)    RDW 17.8 (*)    All other components within normal limits  URINALYSIS, COMPLETE (UACMP) WITH MICROSCOPIC - Abnormal; Notable for the following:    Color, Urine AMBER (*)    APPearance CLOUDY (*)    Specific Gravity, Urine 1.041 (*)    Hgb urine dipstick LARGE (*)    Protein, ur 30 (*)    Leukocytes, UA SMALL (*)    Squamous Epithelial / LPF 6-30 (*)    All other components within normal limits  LIPASE, BLOOD  COMPREHENSIVE METABOLIC PANEL  POC URINE PREG, ED  POCT PREGNANCY, URINE   ____________________________________________  EKG   ____________________________________________  RADIOLOGY  Ct Abdomen Pelvis W Contrast  Result Date: 05/08/2017 CLINICAL DATA:  Abdominal pain EXAM: CT ABDOMEN AND PELVIS WITH CONTRAST TECHNIQUE: Multidetector CT imaging of the  abdomen and pelvis was performed using the standard protocol following bolus administration of intravenous contrast. CONTRAST:  ISOVUE-300 IOPAMIDOL (ISOVUE-300) INJECTION 61% COMPARISON:  None. FINDINGS: Lower chest:  Unremarkable. Hepatobiliary: No focal abnormality within the liver parenchyma. There is no evidence for gallstones, gallbladder wall thickening, or pericholecystic fluid. No intrahepatic or extrahepatic biliary dilation. Pancreas: No focal mass lesion. No dilatation of the main duct. No intraparenchymal cyst. No peripancreatic edema. Spleen: No splenomegaly. No focal mass lesion. Adrenals/Urinary Tract: No adrenal nodule or mass.No adrenal nodule or mass. Kidneys unremarkable. No evidence for hydroureter. The urinary bladder appears normal for the degree of distention. Stomach/Bowel: Stomach is nondistended. No gastric wall thickening. No evidence of outlet obstruction. There is subtle edema/inflammation just posterior to the gastric antrum/duodenum bulb (see image 25 series 2 and sagittal image 59 series 6). No antral or duodenum wall thickening. No small bowel wall thickening. No small bowel dilatation. The terminal ileum is normal. The appendix is normal. No gross colonic mass. No colonic wall thickening. No substantial  diverticular change. Vascular/Lymphatic: No abdominal aortic aneurysm. No abdominal aortic atherosclerotic calcification. There is no gastrohepatic or hepatoduodenal ligament lymphadenopathy. No intraperitoneal or retroperitoneal lymphadenopathy. No pelvic sidewall lymphadenopathy. Reproductive: The uterus has normal CT imaging appearance. There is no adnexal mass. Other: No intraperitoneal free fluid. Musculoskeletal: Bone windows reveal no worrisome lytic or sclerotic osseous lesions. IMPRESSION: 1. Subtle edema/inflammation posterior to the antro pyloric stomach and proximal duodenum. This may be related to antritis/duodenitis or peptic ulcer disease. Edema from groove  pancreatitis is possible, but considered less likely given distribution. 2. Otherwise unremarkable. Electronically Signed   By: Kennith Center M.D.   On: 05/08/2017 11:05    ____________________________________________   PROCEDURES  Procedure(s) performed:   Procedures   ____________________________________________   INITIAL IMPRESSION / ASSESSMENT AND PLAN / ED COURSE  Pertinent labs & imaging results that were available during my care of the patient were reviewed by me and considered in my medical decision making (see chart for details).   Patient reports she is better after the Maalox. She looks well.     ____________________________________________   FINAL CLINICAL IMPRESSION(S) / ED DIAGNOSES  Final diagnoses:  Pain of upper abdomen   Possible ulcer.   NEW MEDICATIONS STARTED DURING THIS VISIT:  New Prescriptions   ESOMEPRAZOLE (NEXIUM) 40 MG CAPSULE    Take 1 capsule (40 mg total) by mouth daily.     Note:  This document was prepared using Dragon voice recognition software and may include unintentional dictation errors.    Arnaldo Natal, MD 05/08/17 [phone]

## 2017-09-15 ENCOUNTER — Emergency Department
Admission: EM | Admit: 2017-09-15 | Discharge: 2017-09-15 | Disposition: A | Discharge status: Home / Self Care | Payer: Medicaid Other | Attending: Emergency Medicine | Admitting: Emergency Medicine

## 2017-09-15 ENCOUNTER — Other Ambulatory Visit: Payer: Self-pay

## 2017-09-15 DIAGNOSIS — J069 Acute upper respiratory infection, unspecified: Secondary | ICD-10-CM | POA: Insufficient documentation

## 2017-09-15 DIAGNOSIS — R05 Cough: Secondary | ICD-10-CM | POA: Diagnosis present

## 2017-09-15 DIAGNOSIS — R109 Unspecified abdominal pain: Secondary | ICD-10-CM | POA: Diagnosis not present

## 2017-09-15 DIAGNOSIS — R070 Pain in throat: Secondary | ICD-10-CM | POA: Diagnosis not present

## 2017-09-15 DIAGNOSIS — R0981 Nasal congestion: Secondary | ICD-10-CM | POA: Insufficient documentation

## 2017-09-15 DIAGNOSIS — Z79899 Other long term (current) drug therapy: Secondary | ICD-10-CM | POA: Insufficient documentation

## 2017-09-15 LAB — COMPREHENSIVE METABOLIC PANEL
ALT: 14 U/L (ref 14–54)
ANION GAP: 7 (ref 5–15)
AST: 19 U/L (ref 15–41)
Albumin: 4.2 g/dL (ref 3.5–5.0)
Alkaline Phosphatase: 87 U/L (ref 38–126)
BUN: 9 mg/dL (ref 6–20)
CHLORIDE: 108 mmol/L (ref 101–111)
CO2: 26 mmol/L (ref 22–32)
CREATININE: 0.83 mg/dL (ref 0.44–1.00)
Calcium: 8.9 mg/dL (ref 8.9–10.3)
Glucose, Bld: 82 mg/dL (ref 65–99)
POTASSIUM: 3.4 mmol/L — AB (ref 3.5–5.1)
SODIUM: 141 mmol/L (ref 135–145)
Total Bilirubin: 0.3 mg/dL (ref 0.3–1.2)
Total Protein: 7.7 g/dL (ref 6.5–8.1)

## 2017-09-15 LAB — URINALYSIS, COMPLETE (UACMP) WITH MICROSCOPIC
BILIRUBIN URINE: NEGATIVE
Bacteria, UA: NONE SEEN
Glucose, UA: NEGATIVE mg/dL
Hgb urine dipstick: NEGATIVE
KETONES UR: NEGATIVE mg/dL
Nitrite: NEGATIVE
PH: 6 (ref 5.0–8.0)
PROTEIN: NEGATIVE mg/dL
Specific Gravity, Urine: 1.029 (ref 1.005–1.030)

## 2017-09-15 LAB — CBC
HEMATOCRIT: 37.4 % (ref 35.0–47.0)
HEMOGLOBIN: 12 g/dL (ref 12.0–16.0)
MCH: 26.9 pg (ref 26.0–34.0)
MCHC: 32 g/dL (ref 32.0–36.0)
MCV: 84.1 fL (ref 80.0–100.0)
PLATELETS: 353 10*3/uL (ref 150–440)
RBC: 4.45 MIL/uL (ref 3.80–5.20)
RDW: 17.5 % — ABNORMAL HIGH (ref 11.5–14.5)
WBC: 8.5 10*3/uL (ref 3.6–11.0)

## 2017-09-15 LAB — POCT PREGNANCY, URINE: Preg Test, Ur: NEGATIVE

## 2017-09-15 LAB — LIPASE, BLOOD: LIPASE: 20 U/L (ref 11–51)

## 2017-09-15 MED ORDER — GUAIFENESIN-CODEINE 100-10 MG/5ML PO SOLN: 5.0000 mL | Freq: Four times a day (QID) | ORAL | 0 refills | Status: DC | PRN

## 2017-09-15 NOTE — Discharge Instructions (Signed)
As we discussed please drink plenty fluids and obtain plenty of rest.  Please use Tylenol or ibuprofen every 6 hours for fever or discomfort as written on the box.  Please use your prescribed cough medication at night to help with symptoms and allow you to get sleep.  Do not use alcohol or drive while taking this medication.  You would also benefit from use of over-the-counter pseudoephedrine (behind the counter at any pharmacy), take as written on the box.  Please follow-up with your doctor in the next 2-3 days for recheck/reevaluation if symptoms fail to improve.  Return to the emergency department for any trouble breathing, or any other symptom personally concerning to yourself.

## 2017-09-15 NOTE — ED Provider Notes (Signed)
[facility] Emergency Department Provider Note  Time seen: 8:07 PM  I have reviewed the triage vital signs and the nursing notes.   HISTORY  Chief Complaint Nasal Congestion and Abdominal Pain    HPI Julie Mcclain is a 22 y.o. female with no past medical history who presents to the emergency department for cough, congestion, sore throat and abdominal pain.  According to the patient for the past 5 days she has been extremely congested with lots of nasal congestion coughing without sputum production.  States upper abdominal pain but only when coughing.  Denies any known fever.  States sore throat with some discomfort with swallowing.  No chest pain.  No shortness of breath or trouble breathing.  Otherwise largely negative review of systems.   Past Medical History:  Diagnosis Date  . Menometrorrhagia   . Obesity     There are no active problems to display for this patient.   History reviewed. No pertinent surgical history.  Prior to Admission medications   Medication Sig Start Date End Date Taking? Authorizing Provider  ARIPiprazole (ABILIFY) 15 MG tablet Take 1 tablet by mouth daily. 03/22/17   [provider]  brompheniramine-pseudoephedrine-DM 30-2-10 MG/5ML syrup Take 10 mLs by mouth 4 (four) times daily as needed. Patient not taking: Reported on 04/12/2017 01/03/16   Hagler, Jami L, PA-C  buPROPion (WELLBUTRIN XL) 300 MG 24 hr tablet Take 1 tablet by mouth daily. 03/22/17   [provider]  busPIRone (BUSPAR) 10 MG tablet Take 1 tablet by mouth 2 (two) times daily. 03/22/17   [provider]  dicyclomine (BENTYL) 20 MG tablet Take 1 tablet (20 mg total) by mouth 3 (three) times daily as needed for spasms (Abdominal pain). Patient not taking: Reported on 04/12/2017 07/25/16 07/25/17  Loleta Rose, MD  esomeprazole (NEXIUM) 40 MG capsule Take 1 capsule (40 mg total) by mouth daily. 05/08/17 05/08/18  Arnaldo Natal, MD  Ferrous  Fumarate 90 MG TABS Take 1 tablet (90 mg total) by mouth daily. Patient not taking: Reported on 04/12/2017 05/13/15   Loleta Rose, MD  lamoTRIgine (LAMICTAL) 200 MG tablet Take 1 tablet by mouth daily. 03/22/17   [provider]  norgestimate-ethinyl estradiol (ORTHO-CYCLEN,SPRINTEC,PREVIFEM) 0.25-35 MG-MCG tablet Take 1 tablet by mouth daily. 05/13/15   Loleta Rose, MD  ondansetron (ZOFRAN) 4 MG tablet Take 1-2 tabs by mouth every 8 hours as needed for nausea/vomiting Patient not taking: Reported on 04/12/2017 07/25/16   Loleta Rose, MD  ranitidine (ZANTAC) 75 MG tablet Take 1 tablet (75 mg total) by mouth 2 (two) times daily. Patient not taking: Reported on 04/12/2017 10/12/15   Schaevitz, Myra Rude, MD  VYVANSE 50 MG capsule Take 1 capsule by mouth daily. 03/22/17   [provider]    No Known Allergies  History reviewed. No pertinent family history.  Social History Social History   Tobacco Use  . Smoking status: Never Smoker  . Smokeless tobacco: Never Used  Substance Use Topics  . Alcohol use: No  . Drug use: No    Review of Systems Constitutional: Negative for fever. Eyes: Negative for visual changes. ENT: Significant nasal and sinus congestion Cardiovascular: Negative for chest pain. Respiratory: Negative for shortness of breath.  Positive for cough.  No sputum production. Gastrointestinal: Positive for upper abdominal discomfort when coughing. Genitourinary: Negative for dysuria. Musculoskeletal: Negative for back pain. Skin: Negative for rash. Neurological: Intermittent headaches. All other ROS negative  ____________________________________________   PHYSICAL EXAM:  VITAL SIGNS:  ED Triage Vitals  Enc Vitals Group     BP 09/15/17 1704 126/89     Pulse Rate 09/15/17 1701 (!) 109     Resp 09/15/17 1701 18     Temp 09/15/17 1701 98.3 F (36.8 C)     Temp Source 09/15/17 1701 Oral     SpO2 09/15/17 1701 100 %     Weight 09/15/17 1702 250  lb (113.4 kg)     Height 09/15/17 1702 5\' 3"  (1.6 m)     Head Circumference --      Peak Flow --      Pain Score 09/15/17 1701 8     Pain Loc --      Pain Edu? --      Excl. in GC? --     Constitutional: Alert and oriented. Well appearing and in no distress. Eyes: Normal exam ENT   Head: Normocephalic and atraumatic.   Nose: Significant rhinorrhea and congestion.   Mouth/Throat: Mucous membranes are moist.  Mild pharyngeal erythema, no tonsillar hypertrophy or exudate. Cardiovascular: Normal rate, regular rhythm around 100 bpm.  No murmur. Respiratory: Normal respiratory effort without tachypnea nor retractions. Breath sounds are clear Gastrointestinal: Soft and nontender. No distention. Musculoskeletal: Nontender with normal range of motion in all extremities. No lower extremity tenderness or edema. Neurologic:  Normal speech and language. No gross focal neurologic deficits Skin:  Skin is warm, dry and intact.  Psychiatric: Mood and affect are normal.     INITIAL IMPRESSION / ASSESSMENT AND PLAN / ED COURSE  Pertinent labs & imaging results that were available during my care of the patient were reviewed by me and considered in my medical decision making (see chart for details).  Patient presents to the emergency department for cough, congestion, sore throat upper abdominal pain when coughing.  Differential would include upper respiratory infection, viral URI, pharyngitis, rhinorrhea, sinus infection, abdominal pain from gallbladder disease, pancreatitis.  Overall the patient appears well, has significant congestion, occasional cough during examination.  Overall clinical picture is most consistent with a viral illness/upper respiratory infection.  Patient's labs have resulted largely within normal limits.  I discussed with the patient the likelihood of a viral infection and supportive care at home including plenty of fluids, Tylenol or ibuprofen every 6 hours as needed for  fever or discomfort.  We will discharge the patient with cough medication.  I also discussed over-the-counter pseudoephedrine for symptom relief during the daytime.  Patient agreeable to this plan of care.  I discussed not drinking or driving while taking the cough medication.  Patient agreeable.  ____________________________________________   FINAL CLINICAL IMPRESSION(S) / ED DIAGNOSES  Upper respiratory infection    Minna AntisPaduchowski, Aamari West, MD 09/15/17 2010

## 2017-09-15 NOTE — ED Triage Notes (Signed)
Pt states nasal congestion, cough, vomiting, LLQ abdominal pain. Pt states "my head been hot." alert, oriented, ambulatory. Pt asking for a work note. No distress noted in triage. Speaking in complete sentences.

## 2018-01-01 ENCOUNTER — Emergency Department
Admission: EM | Admit: 2018-01-01 | Discharge: 2018-01-01 | Discharge status: Against Medical Advice | Payer: Medicaid Other

## 2018-01-01 NOTE — ED Notes (Signed)
Patient called for triage three times without an answer.

## 2018-01-02 ENCOUNTER — Emergency Department
Admission: EM | Admit: 2018-01-02 | Discharge: 2018-01-02 | Disposition: A | Discharge status: Home / Self Care | Payer: Medicaid Other | Attending: Student in an Organized Health Care Education/Training Program | Admitting: Student in an Organized Health Care Education/Training Program

## 2018-01-02 ENCOUNTER — Encounter: Payer: Self-pay | Admitting: Medical Oncology

## 2018-01-02 DIAGNOSIS — R1013 Epigastric pain: Secondary | ICD-10-CM | POA: Diagnosis not present

## 2018-01-02 DIAGNOSIS — Z79899 Other long term (current) drug therapy: Secondary | ICD-10-CM | POA: Insufficient documentation

## 2018-01-02 DIAGNOSIS — R112 Nausea with vomiting, unspecified: Secondary | ICD-10-CM | POA: Insufficient documentation

## 2018-01-02 LAB — URINALYSIS, COMPLETE (UACMP) WITH MICROSCOPIC
BILIRUBIN URINE: NEGATIVE
Glucose, UA: NEGATIVE mg/dL
HGB URINE DIPSTICK: NEGATIVE
KETONES UR: NEGATIVE mg/dL
NITRITE: NEGATIVE
Protein, ur: NEGATIVE mg/dL
Specific Gravity, Urine: 1.026 (ref 1.005–1.030)
pH: 5 (ref 5.0–8.0)

## 2018-01-02 LAB — CBC
HEMATOCRIT: 36.9 % (ref 35.0–47.0)
HEMOGLOBIN: 11.8 g/dL — AB (ref 12.0–16.0)
MCH: 26.8 pg (ref 26.0–34.0)
MCHC: 32 g/dL (ref 32.0–36.0)
MCV: 84 fL (ref 80.0–100.0)
Platelets: 434 10*3/uL (ref 150–440)
RBC: 4.4 MIL/uL (ref 3.80–5.20)
RDW: 17.3 % — ABNORMAL HIGH (ref 11.5–14.5)
WBC: 8.8 10*3/uL (ref 3.6–11.0)

## 2018-01-02 LAB — COMPREHENSIVE METABOLIC PANEL
ALBUMIN: 4.2 g/dL (ref 3.5–5.0)
ALT: 17 U/L (ref 14–54)
ANION GAP: 5 (ref 5–15)
AST: 20 U/L (ref 15–41)
Alkaline Phosphatase: 81 U/L (ref 38–126)
BILIRUBIN TOTAL: 0.5 mg/dL (ref 0.3–1.2)
BUN: 10 mg/dL (ref 6–20)
CO2: 28 mmol/L (ref 22–32)
Calcium: 9.1 mg/dL (ref 8.9–10.3)
Chloride: 105 mmol/L (ref 101–111)
Creatinine, Ser: 0.98 mg/dL (ref 0.44–1.00)
GFR calc non Af Amer: >60 mL/min (ref >60)
GLUCOSE: 87 mg/dL (ref 65–99)
POTASSIUM: 3.5 mmol/L (ref 3.5–5.1)
Sodium: 138 mmol/L (ref 135–145)
TOTAL PROTEIN: 8 g/dL (ref 6.5–8.1)

## 2018-01-02 LAB — PREGNANCY, URINE: PREG TEST UR: NEGATIVE

## 2018-01-02 LAB — LIPASE, BLOOD: Lipase: 20 U/L (ref 11–51)

## 2018-01-02 MED ORDER — RANITIDINE HCL 150 MG PO CAPS: 150.0000 mg | ORAL_CAPSULE | Freq: Two times a day (BID) | ORAL | 0 refills | Status: DC

## 2018-01-02 MED ORDER — PROMETHAZINE HCL 12.5 MG PO TABS: 12.5000 mg | ORAL_TABLET | Freq: Four times a day (QID) | ORAL | 0 refills | Status: DC | PRN

## 2018-01-02 MED ORDER — PROMETHAZINE HCL 25 MG PO TABS
25.0000 mg | ORAL_TABLET | Freq: Once | ORAL | Status: AC
  Administered 2018-01-02: 25 mg via ORAL
  Filled 2018-01-02: qty 1

## 2018-01-02 NOTE — ED Triage Notes (Signed)
Pt reports that she has been having generalized abd pain x 2 weeks with intermittent vomiting. Pt texting on phone in triage, NAD noted.

## 2018-01-02 NOTE — ED Notes (Signed)
Attempted to do POC but test is inconclusive  Urine pregnancy ordered from pharmacy  Lm edt

## 2018-01-02 NOTE — ED Provider Notes (Signed)
[facility] Emergency Department Provider Note    First MD Initiated Contact with Patient 01/02/18 1751     (approximate)  I have reviewed the triage vital signs and the nursing notes.   HISTORY  Chief Complaint Abdominal Pain    HPI Julie Mcclain is a 22 y.o. female presents to the ER with chief complaint for evaluation of burning epigastric discomfort associated with nausea and vomiting.  States that this is going on for the past several weeks.  Denies any measured fevers.  No dysuria.  Denies any chance of being pregnant.  Denies any diarrhea.  No recent sick contacts.  States that she will vomit 2 times a day and frequently in the afternoon.  And states that she is been having trouble sleeping due to the epigastric discomfort at night.  Does have significant family history of acid reflux.  Past Medical History:  Diagnosis Date  . Menometrorrhagia   . Obesity    No family history on file. History reviewed. No pertinent surgical history. There are no active problems to display for this patient.     Prior to Admission medications   Medication Sig Start Date End Date Taking? Authorizing Provider  ARIPiprazole (ABILIFY) 15 MG tablet Take 1 tablet by mouth daily. 03/22/17   [provider]  brompheniramine-pseudoephedrine-DM 30-2-10 MG/5ML syrup Take 10 mLs by mouth 4 (four) times daily as needed. Patient not taking: Reported on 04/12/2017 01/03/16   Hagler, Jami L, PA-C  buPROPion (WELLBUTRIN XL) 300 MG 24 hr tablet Take 1 tablet by mouth daily. 03/22/17   [provider]  busPIRone (BUSPAR) 10 MG tablet Take 1 tablet by mouth 2 (two) times daily. 03/22/17   [provider]  dicyclomine (BENTYL) 20 MG tablet Take 1 tablet (20 mg total) by mouth 3 (three) times daily as needed for spasms (Abdominal pain). Patient not taking: Reported on 04/12/2017 07/25/16 07/25/17  Loleta Rose, MD  esomeprazole (NEXIUM) 40 MG capsule Take 1  capsule (40 mg total) by mouth daily. 05/08/17 05/08/18  Arnaldo Natal, MD  Ferrous Fumarate 90 MG TABS Take 1 tablet (90 mg total) by mouth daily. Patient not taking: Reported on 04/12/2017 05/13/15   Loleta Rose, MD  guaiFENesin-codeine 100-10 MG/5ML syrup Take 5 mLs by mouth every 6 (six) hours as needed for cough. 09/15/17   Minna Antis, MD  lamoTRIgine (LAMICTAL) 200 MG tablet Take 1 tablet by mouth daily. 03/22/17   [provider]  norgestimate-ethinyl estradiol (ORTHO-CYCLEN,SPRINTEC,PREVIFEM) 0.25-35 MG-MCG tablet Take 1 tablet by mouth daily. 05/13/15   Loleta Rose, MD  ondansetron (ZOFRAN) 4 MG tablet Take 1-2 tabs by mouth every 8 hours as needed for nausea/vomiting Patient not taking: Reported on 04/12/2017 07/25/16   Loleta Rose, MD  ranitidine (ZANTAC) 75 MG tablet Take 1 tablet (75 mg total) by mouth 2 (two) times daily. Patient not taking: Reported on 04/12/2017 10/12/15   Schaevitz, Myra Rude, MD  VYVANSE 50 MG capsule Take 1 capsule by mouth daily. 03/22/17   [provider]    Allergies Patient has no known allergies.    Social History Social History   Tobacco Use  . Smoking status: Never Smoker  . Smokeless tobacco: Never Used  Substance Use Topics  . Alcohol use: No  . Drug use: No    Review of Systems Patient denies headaches, rhinorrhea, blurry vision, numbness, shortness of breath, chest pain, edema, cough, abdominal pain, nausea, vomiting, diarrhea, dysuria, fevers, rashes or hallucinations unless otherwise  stated above in HPI. ____________________________________________   PHYSICAL EXAM:  VITAL SIGNS: Vitals:   01/02/18 1609  BP: 138/75  Pulse: 99  Resp: 16  Temp: 98.6 F (37 C)  SpO2: 99%    Constitutional: Alert and oriented. Well appearing and in no acute distress. Eyes: Conjunctivae are normal.  Head: Atraumatic. Nose: No congestion/rhinnorhea. Mouth/Throat: Mucous membranes are moist.   Neck: No stridor.  Painless ROM.  Cardiovascular: Normal rate, regular rhythm. Grossly normal heart sounds.  Good peripheral circulation. Respiratory: Normal respiratory effort.  No retractions. Lungs CTAB. Gastrointestinal: Soft and nontender in all four quadrants. No distention. No abdominal bruits. No CVA tenderness. Genitourinary:  Musculoskeletal: No lower extremity tenderness nor edema.  No joint effusions. Neurologic:  Normal speech and language. No gross focal neurologic deficits are appreciated. No facial droop Skin:  Skin is warm, dry and intact. No rash noted. Psychiatric: Mood and affect are normal. Speech and behavior are normal.  ____________________________________________   LABS (all labs ordered are listed, but only abnormal results are displayed)  Results for orders placed or performed during the hospital encounter of 01/02/18 (from the past 24 hour(s))  Urinalysis, Complete w Microscopic     Status: Abnormal   Collection Time: 01/02/18  4:10 PM  Result Value Ref Range   Color, Urine YELLOW (A) YELLOW   APPearance HAZY (A) CLEAR   Specific Gravity, Urine 1.026 1.005 - 1.030   pH 5.0 5.0 - 8.0   Glucose, UA NEGATIVE NEGATIVE mg/dL   Hgb urine dipstick NEGATIVE NEGATIVE   Bilirubin Urine NEGATIVE NEGATIVE   Ketones, ur NEGATIVE NEGATIVE mg/dL   Protein, ur NEGATIVE NEGATIVE mg/dL   Nitrite NEGATIVE NEGATIVE   Leukocytes, UA LARGE (A) NEGATIVE   RBC / HPF 0-5 0 - 5 RBC/hpf   WBC, UA 6-30 0 - 5 WBC/hpf   Squamous Epithelial / LPF 6-30 (A) NONE SEEN   Mucus PRESENT   Pregnancy, urine     Status: None   Collection Time: 01/02/18  4:10 PM  Result Value Ref Range   Preg Test, Ur NEGATIVE NEGATIVE  Lipase, blood     Status: None   Collection Time: 01/02/18  4:11 PM  Result Value Ref Range   Lipase 20 11 - 51 U/L  Comprehensive metabolic panel     Status: None   Collection Time: 01/02/18  4:11 PM  Result Value Ref Range   Sodium 138 135 - 145 mmol/L   Potassium 3.5 3.5 - 5.1 mmol/L    Chloride 105 101 - 111 mmol/L   CO2 28 22 - 32 mmol/L   Glucose, Bld 87 65 - 99 mg/dL   BUN 10 6 - 20 mg/dL   Creatinine, Ser 1.61 0.44 - 1.00 mg/dL   Calcium 9.1 8.9 - 09.6 mg/dL   Total Protein 8.0 6.5 - 8.1 g/dL   Albumin 4.2 3.5 - 5.0 g/dL   AST 20 15 - 41 U/L   ALT 17 14 - 54 U/L   Alkaline Phosphatase 81 38 - 126 U/L   Total Bilirubin 0.5 0.3 - 1.2 mg/dL   GFR calc non Af Amer >60 >60 mL/min   GFR calc Af Amer >60 >60 mL/min   Anion gap 5 5 - 15  CBC     Status: Abnormal   Collection Time: 01/02/18  4:11 PM  Result Value Ref Range   WBC 8.8 3.6 - 11.0 K/uL   RBC 4.40 3.80 - 5.20 MIL/uL   Hemoglobin 11.8 (L) 12.0 -  16.0 g/dL   HCT 13.036.9 86.535.0 - 78.447.0 %   MCV 84.0 80.0 - 100.0 fL   MCH 26.8 26.0 - 34.0 pg   MCHC 32.0 32.0 - 36.0 g/dL   RDW 69.617.3 (H) 29.511.5 - 28.414.5 %   Platelets 434 150 - 440 K/uL   ____________________________________________  EKG____________________________________________  RADIOLOGY   ____________________________________________   PROCEDURES  Procedure(s) performed:  Procedures    Critical Care performed: no ____________________________________________   INITIAL IMPRESSION / ASSESSMENT AND PLAN / ED COURSE  Pertinent labs & imaging results that were available during my care of the patient were reviewed by me and considered in my medical decision making (see chart for details).  DDX: Gastritis, gastroenteritis, cholelithiasis, cholecystitis, UTI, pregnancy, dehydration  Solange Kateri PlummerMorrow is a 22 y.o. who presents to the ED with symptoms as described above.  Patient well-appearing in no acute distress.  Blood work is reassuring.  Given her benign abdominal exam with normal white count and no fever I do not feel that emergent diagnostic imaging clinically indicated at this time.  Possible gastroenteritis but given the burning sensation in the epigastric region do suspect some component of gastritis.  Do feel patient is appropriate and stable for  trial of outpatient management with an acid as well as antiemetic medication.  Have discussed with the patient and available family all diagnostics and treatments performed thus far and all questions were answered to the best of my ability. The patient demonstrates understanding and agreement with plan.       As part of my medical decision making, I reviewed the following data within the electronic MEDICAL RECORD NUMBER Nursing notes reviewed and incorporated, Labs reviewed, notes from prior ED visits and [place] Controlled Substance Database   ____________________________________________   FINAL CLINICAL IMPRESSION(S) / ED DIAGNOSES  Final diagnoses:  Epigastric pain  Non-intractable vomiting with nausea, unspecified vomiting type      NEW MEDICATIONS STARTED DURING THIS VISIT:  New Prescriptions   No medications on file     Note:  This document was prepared using Dragon voice recognition software and may include unintentional dictation errors.    Willy Eddyobinson, Shavette Shoaff, MD 01/02/18 [phone]

## 2018-01-02 NOTE — Discharge Instructions (Signed)

## 2019-04-03 ENCOUNTER — Other Ambulatory Visit: Payer: Self-pay

## 2019-04-03 ENCOUNTER — Ambulatory Visit (LOCAL_COMMUNITY_HEALTH_CENTER): Payer: Medicaid Other

## 2019-04-03 VITALS — BP 115/76 | Ht 63.0 in | Wt 252.4 lb

## 2019-04-03 DIAGNOSIS — Z113 Encounter for screening for infections with a predominantly sexual mode of transmission: Secondary | ICD-10-CM | POA: Diagnosis not present

## 2019-04-03 DIAGNOSIS — Z3202 Encounter for pregnancy test, result negative: Secondary | ICD-10-CM | POA: Diagnosis not present

## 2019-04-03 DIAGNOSIS — Z3009 Encounter for other general counseling and advice on contraception: Secondary | ICD-10-CM | POA: Diagnosis not present

## 2019-04-03 LAB — WET PREP FOR TRICH, YEAST, CLUE
Trichomonas Exam: NEGATIVE
Yeast Exam: NEGATIVE

## 2019-04-03 LAB — PREGNANCY, URINE: Preg Test, Ur: NEGATIVE

## 2019-04-03 NOTE — Progress Notes (Signed)
Pt left after going to lab, did not return to clinic. Wet mount and UPT reviewed, all negative. Pt left behind handwritten Rx for [facility] and [employer] Phone call to pt: left messaged that RN with [facility] is calling to set up an appt to finish her visit, can call us back at [phone] to schedule a visit on a day that is convenient for her.

## 2019-04-04 MED ORDER — NORGESTIMATE-ETH ESTRADIOL 0.25-35 MG-MCG PO TABS: 1.0000 | ORAL_TABLET | Freq: Every day | ORAL | 3 refills | Status: DC

## 2019-04-04 MED ORDER — ELLA 30 MG PO TABS: 1.0000 | ORAL_TABLET | Freq: Once | ORAL | 0 refills | Status: AC

## 2019-04-04 NOTE — Progress Notes (Signed)
   [place] problem visit  [place] Department  Subjective:  Julie Mcclain is a 23 y.o. being seen today for birth control, PT and STD screen  Chief Complaint  Patient presents with  . Contraception    Pt desires UPT; if negative, wants depo (in arm)    Client states she is concerned about pregnancy and she's interested in Depo as her birth control method.  Her LMP was 03/17/2019,  She had sex on 03/25/19 with a condom but it broke.  She had unprotected sex with 2 other partners on 7/17 and 7/20.  Client that she has monthly cycle usually the first of each month. She states that she became sexually active this month.    Does the patient have a current or past history of drug use? No   No components found for: HCV]   Health Maintenance Due  Topic Date Due  . HIV Screening  08/07/2009  . TETANUS/TDAP  08/07/2013  . PAP-Cervical Cytology Screening  08/08/2015  . PAP SMEAR-Modifier  08/08/2015    Review of Systems  HENT: Negative for sore throat.   Genitourinary: Negative for dysuria.       Neg vag. Disch./pain with sex  Skin: Negative.     The following portions of the patient's history were reviewed and updated as appropriate: allergies, current medications, past family history, past medical history, past social history, past surgical history and problem list. Problem list updated.   See flowsheet for other program required questions.  Objective:   Vitals:   04/03/19 1320  BP: 115/76  Weight: 252 lb 6.4 oz (114.5 kg)  Height: 5\' 3"  (1.6 m)    Physical Exam Constitutional:      Appearance: She is obese.  Neck:     Musculoskeletal: No muscular tenderness.  Genitourinary:    Vagina: Vaginal discharge present.     Comments: Thick yellow disch noted in vagina, pH 4.5,no odor on exam  Bimanual not indicated Lymphadenopathy:     Cervical: No cervical adenopathy.  Skin:    General: Skin is warm.     Findings: No lesion or rash.   Neurological:     Mental Status: She is alert.       Assessment and Plan:  Secret Goodgame is a 23 y.o. female presenting to the [facility] Department for a Women's Health problem visit  1. Pregnancy examination or test, negative result  - Pregnancy, urine -ECPs Festus Holts take 1 tablet today    RX phone in To [facility] [address] birth control  Sprintec take 1 tablet daily x 4 packs.  Start the day after taking Festus Holts.  RX phone in to [facility]. [place] states that she understands how to take meds. -Co. To use condoms always for STD prevention and for back-up  No follow-ups on file.  No future appointments.  Hassell Done, FNP

## 2019-09-20 ENCOUNTER — Other Ambulatory Visit: Payer: Self-pay

## 2019-09-20 ENCOUNTER — Encounter (HOSPITAL_COMMUNITY): Payer: Self-pay

## 2019-09-20 ENCOUNTER — Emergency Department (HOSPITAL_COMMUNITY)
Admission: EM | Admit: 2019-09-20 | Discharge: 2019-09-20 | Disposition: A | Discharge status: Home / Self Care | Payer: Medicaid Other | Attending: Emergency Medicine | Admitting: Emergency Medicine

## 2019-09-20 DIAGNOSIS — R220 Localized swelling, mass and lump, head: Secondary | ICD-10-CM | POA: Diagnosis present

## 2019-09-20 DIAGNOSIS — Z79899 Other long term (current) drug therapy: Secondary | ICD-10-CM | POA: Insufficient documentation

## 2019-09-20 DIAGNOSIS — W458XXD Other foreign body or object entering through skin, subsequent encounter: Secondary | ICD-10-CM | POA: Insufficient documentation

## 2019-09-20 MED ORDER — NAPROXEN 500 MG PO TABS: 500.0000 mg | ORAL_TABLET | Freq: Two times a day (BID) | ORAL | 0 refills | Status: DC

## 2019-09-20 MED ORDER — CLINDAMYCIN HCL 300 MG PO CAPS: 300.0000 mg | ORAL_CAPSULE | Freq: Three times a day (TID) | ORAL | 0 refills | Status: AC

## 2019-09-20 MED ORDER — LIDOCAINE HCL (PF) 1 % IJ SOLN
5.0000 mL | Freq: Once | INTRAMUSCULAR | Status: AC
  Administered 2019-09-20: 5 mL via INTRADERMAL
  Filled 2019-09-20: qty 30

## 2019-09-20 NOTE — Discharge Instructions (Signed)
Please take your antibiotics and naproxen, as prescribed.  Encourage you to apply ice and warm compresses intermittently, several times per day.  Follow-up with your primary care provider for ongoing evaluation and management.  Return to the ED or seek medical attention immediately should develop any new or worsening symptoms.

## 2019-09-20 NOTE — ED Provider Notes (Signed)
[facility]-EMERGENCY DEPT Provider Note   CSN: [id number] Arrival date & time: 09/20/19  1753     History Chief Complaint  Patient presents with  . Oral Swelling    Julie Mcclain is a 24 y.o. female with no significant past medical history presents to the ED with complaints of upper lip swelling.  Patient reportedly had a piercing placed 2 months ago that is intended to be left in.  Over the course of the past 3 days, she noted swelling to the upper lip and states that the bottom portion of the piercing which is supposed to be all the way through the lip has been "swallowed" by the surrounding tissue.  She subsequently noticed mildly purulent drainage which prompted her to come in the ED for evaluation.  She endorses moderate discomfort, particular with any palpation or manipulation of the piercing.  She denies any fevers or chills, difficulty eating or drinking, recent illness, headache, tongue swelling, or any other symptoms.    HPI     Past Medical History:  Diagnosis Date  . Menometrorrhagia   . Obesity     There are no problems to display for this patient.   History reviewed. No pertinent surgical history.   OB History   No obstetric history on file.     No family history on file.  Social History   Tobacco Use  . Smoking status: Never Smoker  . Smokeless tobacco: Never Used  Substance Use Topics  . Alcohol use: No  . Drug use: No    Home Medications Prior to Admission medications   Medication Sig Start Date End Date Taking? Authorizing Provider  ARIPiprazole (ABILIFY) 15 MG tablet Take 1 tablet by mouth daily. 03/22/17   [provider]  brompheniramine-pseudoephedrine-DM 30-2-10 MG/5ML syrup Take 10 mLs by mouth 4 (four) times daily as needed. Patient not taking: Reported on 04/12/2017 01/03/16   Hagler, Jami L, PA-C  buPROPion (WELLBUTRIN XL) 300 MG 24 hr tablet Take 1 tablet by mouth daily. 03/22/17   [provider]    busPIRone (BUSPAR) 10 MG tablet Take 1 tablet by mouth 2 (two) times daily. 03/22/17   [provider]  clindamycin (CLEOCIN) 300 MG capsule Take 1 capsule (300 mg total) by mouth 3 (three) times daily for 7 days. 09/20/19 09/27/19  Lorelee New, PA-C  dicyclomine (BENTYL) 20 MG tablet Take 1 tablet (20 mg total) by mouth 3 (three) times daily as needed for spasms (Abdominal pain). Patient not taking: Reported on 04/12/2017 07/25/16 07/25/17  Loleta Rose, MD  esomeprazole (NEXIUM) 40 MG capsule Take 1 capsule (40 mg total) by mouth daily. 05/08/17 05/08/18  Arnaldo Natal, MD  Ferrous Fumarate 90 MG TABS Take 1 tablet (90 mg total) by mouth daily. Patient not taking: Reported on 04/12/2017 05/13/15   Loleta Rose, MD  guaiFENesin-codeine 100-10 MG/5ML syrup Take 5 mLs by mouth every 6 (six) hours as needed for cough. Patient not taking: Reported on 04/03/2019 09/15/17   Minna Antis, MD  lamoTRIgine (LAMICTAL) 200 MG tablet Take 1 tablet by mouth daily. 03/22/17   [provider]  naproxen (NAPROSYN) 500 MG tablet Take 1 tablet (500 mg total) by mouth 2 (two) times daily. 09/20/19   Lorelee New, PA-C  norgestimate-ethinyl estradiol (ORTHO-CYCLEN) 0.25-35 MG-MCG tablet Take 1 tablet by mouth daily. 04/04/19 07/05/19  Larene Pickett, FNP  norgestimate-ethinyl estradiol (ORTHO-CYCLEN, 28,) 0.25-35 MG-MCG tablet Take 1 tablet by mouth daily. 04/04/19 07/05/19  Larene Pickett,  FNP  norgestimate-ethinyl estradiol (ORTHO-CYCLEN,SPRINTEC,PREVIFEM) 0.25-35 MG-MCG tablet Take 1 tablet by mouth daily. Patient not taking: Reported on 04/04/2019 05/13/15   Loleta Rose, MD  ondansetron [facility]) 4 MG tablet Take 1-2 tabs by mouth every 8 hours as needed for nausea/vomiting Patient not taking: Reported on 04/12/2017 07/25/16   Loleta Rose, MD  promethazine (PHENERGAN) 12.5 MG tablet Take 1 tablet (12.5 mg total) by mouth every 6 (six) hours as needed for nausea or vomiting. Patient not  taking: Reported on 04/03/2019 01/02/18   Willy Eddy, MD  ranitidine (ZANTAC) 150 MG capsule Take 1 capsule (150 mg total) by mouth 2 (two) times daily. 01/02/18 02/01/18  Willy Eddy, MD  ranitidine (ZANTAC) 75 MG tablet Take 1 tablet (75 mg total) by mouth 2 (two) times daily. Patient not taking: Reported on 04/12/2017 10/12/15   Schaevitz, Myra Rude, MD  VYVANSE 50 MG capsule Take 1 capsule by mouth daily. 03/22/17   [provider]    Allergies    Patient has no known allergies.  Review of Systems   Review of Systems  Constitutional: Negative for chills and fever.  HENT: Negative for trouble swallowing.   Skin: Positive for wound. Negative for color change.      Physical Exam Updated Vital Signs BP (!) 143/99 (BP Location: Left Arm)   Pulse (!) 112   Temp 98.9 F (37.2 C) (Oral)   Resp 18   SpO2 97%   Physical Exam Vitals and nursing note reviewed. Exam conducted with a chaperone present.  Constitutional:      Appearance: Normal appearance.  HENT:     Head: Normocephalic and atraumatic.     Mouth/Throat:     Comments: Upper lip is swollen, mildly indurated.  Mild purulent drainage from proximal end of piercing.  No surrounding erythema.  Tender to palpation.  Inferior aspect of piercing unable to be visualized.  Swelling on the inside of upper lip. Eyes:     General: No scleral icterus.    Conjunctiva/sclera: Conjunctivae normal.  Cardiovascular:     Rate and Rhythm: Regular rhythm. Tachycardia present.     Pulses: Normal pulses.     Heart sounds: Normal heart sounds.  Pulmonary:     Effort: Pulmonary effort is normal. No respiratory distress.     Breath sounds: Normal breath sounds.  Musculoskeletal:     Cervical back: Normal range of motion and neck supple.  Skin:    General: Skin is dry.  Neurological:     Mental Status: She is alert.     GCS: GCS eye subscore is 4. GCS verbal subscore is 5. GCS motor subscore is 6.  Psychiatric:         Mood and Affect: Mood normal.        Behavior: Behavior normal.        Thought Content: Thought content normal.           ED Results / Procedures / Treatments   Labs (all labs ordered are listed, but only abnormal results are displayed) Labs Reviewed - No data to display  EKG None  Radiology No results found.  Procedures Procedures (including critical care time)  Medications Ordered in ED Medications  lidocaine (PF) (XYLOCAINE) 1 % injection 5 mL (has no administration in time range)    ED Course  I have reviewed the triage vital signs and the nursing notes.  Pertinent labs & imaging results that were available during my care of the patient were reviewed by me  and considered in my medical decision making (see chart for details).    MDM Rules/Calculators/A&P                      Provided 1% lidocaine local anesthesia to the upper lip and extracted the piercing entirely.  No incision required.  Minimal bleeding and procedure was well-tolerated.  Will prescribe clindamycin 300 mg 3 times daily x7 days.  We will also prescribe naproxen given patient's inflammation.  Mild drainage, mostly clear or bloody.  No significant purulent drainage expressed.  Lower suspicion for large abscess at this time and instead suspect inflammation due to recently dislodged piercing.  Recommend the patient follow-up with her PCP for ongoing evaluation and management.  Strict return precautions discussed.  Encouraged patient to avoid similar piercings.  Patient voiced understanding and is agreeable to plan.   Final Clinical Impression(s) / ED Diagnoses Final diagnoses:  Swollen upper lip    Rx / DC Orders ED Discharge Orders         Ordered    clindamycin (CLEOCIN) 300 MG capsule  3 times daily     09/20/19 2051    naproxen (NAPROSYN) 500 MG tablet  2 times daily     09/20/19 2051           Reita Chard 09/20/19 2114    Milton Ferguson, MD 09/20/19 2325

## 2019-09-20 NOTE — ED Notes (Signed)
An After Visit Summary was printed and given to the patient. Discharge instructions given and no further questions at this time.  

## 2019-09-20 NOTE — ED Triage Notes (Addendum)
Pt had a lip piercing put in [birth year]. Pt states that the last few days it has become more swollen. Pt requesting it to be numbed. Pus coming from site.

## 2019-11-15 ENCOUNTER — Ambulatory Visit: Payer: Medicaid Other | Admitting: Physician Assistant

## 2019-11-15 ENCOUNTER — Other Ambulatory Visit: Payer: Self-pay

## 2019-11-15 ENCOUNTER — Encounter: Payer: Self-pay | Admitting: Physician Assistant

## 2019-11-15 DIAGNOSIS — A5901 Trichomonal vulvovaginitis: Secondary | ICD-10-CM

## 2019-11-15 DIAGNOSIS — Z202 Contact with and (suspected) exposure to infections with a predominantly sexual mode of transmission: Secondary | ICD-10-CM

## 2019-11-15 DIAGNOSIS — Z113 Encounter for screening for infections with a predominantly sexual mode of transmission: Secondary | ICD-10-CM

## 2019-11-15 LAB — WET PREP FOR TRICH, YEAST, CLUE
Trichomonas Exam: POSITIVE — AB
Yeast Exam: NEGATIVE

## 2019-11-15 MED ORDER — METRONIDAZOLE 500 MG PO TABS: 2000.0000 mg | ORAL_TABLET | Freq: Once | ORAL | 0 refills | Status: AC

## 2019-11-15 MED ORDER — CEFTRIAXONE SODIUM 250 MG IJ SOLR
500.0000 mg | Freq: Once | INTRAMUSCULAR | Status: AC
  Administered 2019-11-15: 500 mg via INTRAMUSCULAR

## 2019-11-15 MED ORDER — DOXYCYCLINE HYCLATE 100 MG PO TABS: 100.0000 mg | ORAL_TABLET | Freq: Two times a day (BID) | ORAL | 0 refills | Status: AC

## 2019-11-15 NOTE — Progress Notes (Signed)
Here today for STD screening. Accepts bloodwork. Kaysen Deal, RN ° °

## 2019-11-15 NOTE — Progress Notes (Signed)
[employer] results reviewed. Patient treated per standing orders for Trich and per provider orders as a contact to Gonorrhea. Tawny Hopping, RN

## 2019-11-16 NOTE — Progress Notes (Signed)
[facility] Department STI clinic/screening visit  Subjective:  Julie Mcclain is a 24 y.o. female being seen today for an STI screening visit. The patient reports they do have symptoms.  Patient reports that they do not desire a pregnancy in the next year.   They reported they are not interested in discussing contraception today.  Patient's last menstrual period was 10/15/2019 (approximate).   Patient has the following medical conditions:  There are no problems to display for this patient.   Chief Complaint  Patient presents with  . SEXUALLY TRANSMITTED DISEASE    HPI  Patient reports that she is a contact to Gonorrhea.  Reports that she has noticed slight dysuria for 2 days and pain with sex one time.  LMP approximately 10/18/2019 and normal. Using OCs as BCM.  See flowsheet for further details and programmatic requirements.    The following portions of the patient's history were reviewed and updated as appropriate: allergies, current medications, past medical history, past social history, past surgical history and problem list.  Objective:  There were no vitals filed for this visit.  Physical Exam Constitutional:      General: She is not in acute distress.    Appearance: Normal appearance. She is obese.  HENT:     Head: Normocephalic and atraumatic.     Comments: No nits, lice,or hair loss. No cervical, supraclavicular or axillary adenopathy.    Mouth/Throat:     Mouth: Mucous membranes are moist.     Pharynx: Oropharynx is clear. No oropharyngeal exudate or posterior oropharyngeal erythema.  Eyes:     Conjunctiva/sclera: Conjunctivae normal.  Pulmonary:     Effort: Pulmonary effort is normal.  Abdominal:     Palpations: Abdomen is soft. There is no mass.     Tenderness: There is no abdominal tenderness. There is no guarding or rebound.  Genitourinary:    General: Normal vulva.     Rectum: Normal.     Comments: External genitalia/pubic area without nits,  lice, edema erythema, lesions and inguinal adenopathy. Vagina with normal mucosa, small amount of thin, white discharge, pH=4.5. Cervix without visible lesions. Uterus firm, mobile, nt, no masses, no CMT, no adnexal tenderness or fullness. Musculoskeletal:     Cervical back: Neck supple. No tenderness.  Skin:    General: Skin is warm and dry.     Findings: No bruising, erythema, lesion or rash.  Neurological:     Mental Status: She is alert and oriented to person, place, and time.  Psychiatric:        Mood and Affect: Mood normal.        Behavior: Behavior normal.        Thought Content: Thought content normal.        Judgment: Judgment normal.      Assessment and Plan:  Julie Mcclain is a 24 y.o. female presenting to the [facility] Department for STI screening  1. Screening for STD (sexually transmitted disease) Patient into clinic with symptoms and is a contact to [facility]. Rec condoms with all sex. Await test results.  Counseled that RN will call if needs to RTC for further treatment once results are back.  - WET PREP FOR [place], YEAST, CLUE - Chlamydia/Gonorrhea [place] [facility] - HIV [place] [facility] - Syphilis Serology, [place] [facility]  2. Gonorrhea contact Treat as a contact to [facility] with Ceftriaxone 500mg  IM and since unable to rule out Chlamydia with Doxycycline 100mg  #14 1 po BID for 7 days.  No sex for 7 days and until after partner completes treatment. - cefTRIAXone (ROCEPHIN) injection 500 mg - doxycycline (VIBRA-TABS) 100 MG tablet; Take 1 tablet (100 mg total) by mouth 2 (two) times daily for 7 days.  Dispense: 14 tablet; Refill: 0  3. Trichomonal vaginitis Treat for Trich with Metronidazole 2 g po with food, no EtOH for 24 hr before and until 72 hr after taking medicine. No sex for 7 days and until after partner completes treatment. Enc to use OTC antifungal cream if has itching during or just after antibiotic treatment. - metroNIDAZOLE (FLAGYL) 500 MG tablet; Take 4  tablets (2,000 mg total) by mouth once for 1 dose.  Dispense: 4 tablet; Refill: 0     No follow-ups on file.  No future appointments.  Matt Holmes, PA

## 2019-11-22 ENCOUNTER — Telehealth: Payer: Self-pay

## 2019-11-22 DIAGNOSIS — A749 Chlamydial infection, unspecified: Secondary | ICD-10-CM

## 2019-11-22 DIAGNOSIS — A549 Gonococcal infection, unspecified: Secondary | ICD-10-CM

## 2019-11-22 NOTE — Telephone Encounter (Signed)
Attempted TC to patient.  RN called # in Epic. Mother of patient answered the phone and gave a different # for patient which does not work [phone]). RN attempted all #'s in chart and was unable to contact patient. Richmond Campbell, RN

## 2019-11-27 NOTE — Telephone Encounter (Signed)
Letter mailed. See scanned. Richmond Campbell, RN

## 2020-02-25 ENCOUNTER — Ambulatory Visit: Payer: Medicaid Other

## 2020-07-11 ENCOUNTER — Emergency Department: Payer: Medicaid Other

## 2020-07-11 ENCOUNTER — Other Ambulatory Visit: Payer: Self-pay

## 2020-07-11 ENCOUNTER — Encounter: Payer: Self-pay | Admitting: Emergency Medicine

## 2020-07-11 DIAGNOSIS — S060X0A Concussion without loss of consciousness, initial encounter: Secondary | ICD-10-CM | POA: Diagnosis not present

## 2020-07-11 DIAGNOSIS — Y92410 Unspecified street and highway as the place of occurrence of the external cause: Secondary | ICD-10-CM | POA: Insufficient documentation

## 2020-07-11 DIAGNOSIS — M79672 Pain in left foot: Secondary | ICD-10-CM | POA: Diagnosis not present

## 2020-07-11 DIAGNOSIS — S0990XA Unspecified injury of head, initial encounter: Secondary | ICD-10-CM | POA: Diagnosis present
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
## 2020-07-12 ENCOUNTER — Emergency Department
Admission: EM | Admit: 2020-07-12 | Discharge: 2020-07-12 | Disposition: A | Discharge status: Home / Self Care | Payer: Medicaid Other | Attending: Emergency Medicine | Admitting: Emergency Medicine

## 2020-07-12 DIAGNOSIS — M79672 Pain in left foot: Secondary | ICD-10-CM

## 2020-07-12 DIAGNOSIS — S060X0A Concussion without loss of consciousness, initial encounter: Secondary | ICD-10-CM
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
## 2020-07-12 NOTE — Discharge Instructions (Signed)
Please use ibuprofen or Tylenol for any continued pain. 

## 2020-10-05 ENCOUNTER — Emergency Department
Admission: EM | Admit: 2020-10-05 | Discharge: 2020-10-05 | Disposition: A | Discharge status: Home / Self Care | Payer: Medicaid Other | Attending: Emergency Medicine | Admitting: Emergency Medicine

## 2020-10-05 ENCOUNTER — Encounter: Payer: Self-pay | Admitting: Emergency Medicine

## 2020-10-05 ENCOUNTER — Other Ambulatory Visit: Payer: Self-pay

## 2020-10-05 DIAGNOSIS — R350 Frequency of micturition: Secondary | ICD-10-CM | POA: Insufficient documentation

## 2020-10-05 DIAGNOSIS — Z5321 Procedure and treatment not carried out due to patient leaving prior to being seen by health care provider: Secondary | ICD-10-CM | POA: Insufficient documentation

## 2020-10-05 LAB — URINALYSIS, COMPLETE (UACMP) WITH MICROSCOPIC
Bacteria, UA: NONE SEEN
Bilirubin Urine: NEGATIVE
Glucose, UA: NEGATIVE mg/dL
Hgb urine dipstick: NEGATIVE
Ketones, ur: NEGATIVE mg/dL
Nitrite: NEGATIVE
Protein, ur: NEGATIVE mg/dL
Specific Gravity, Urine: 1.027 (ref 1.005–1.030)
pH: 5 (ref 5.0–8.0)

## 2020-10-05 LAB — POC URINE PREG, ED: Preg Test, Ur: NEGATIVE

## 2020-10-05 NOTE — ED Triage Notes (Signed)
Pt presents via POV with c/o urinary frequency. Pt reports she was supposed to start period this week and hasnt. Concerned she is pregnant.

## 2020-10-05 NOTE — ED Notes (Signed)
Pt states that she has to go back to work. Pt is going to leave and will come back after he gets off

## 2020-10-09 ENCOUNTER — Other Ambulatory Visit: Payer: Self-pay

## 2020-10-09 ENCOUNTER — Ambulatory Visit (LOCAL_COMMUNITY_HEALTH_CENTER): Payer: Medicaid Other

## 2020-10-09 VITALS — BP 117/72 | Ht 63.0 in | Wt 285.5 lb

## 2020-10-09 DIAGNOSIS — Z3202 Encounter for pregnancy test, result negative: Secondary | ICD-10-CM

## 2020-10-09 LAB — PREGNANCY, URINE: Preg Test, Ur: NEGATIVE

## 2020-10-09 NOTE — Progress Notes (Signed)
UPT negative today. Reports a + home preg test and wants to be preg. LMP 08/31/2020. Per pt,  unsure whether last sex was 3 weeks ago or less. Using condoms sometimes.  No PCP. Local provider resource list given and advised to establish care. Counseled on [facility] services.  May return for preg test 10-14 days if no menses. Pt in agreement. Questions answered and reports understanding. Jerel Shepherd, RN

## 2020-10-14 ENCOUNTER — Encounter: Payer: Medicaid Other | Admitting: Obstetrics and Gynecology

## 2020-10-17 ENCOUNTER — Telehealth: Payer: Self-pay | Admitting: Family Medicine

## 2020-10-17 NOTE — Telephone Encounter (Signed)
PATIENT STATES THAT SHE HAS BN BLEEDING HEAVILY FOR LIKE TWO WEEKS NOW, AND AT FIRST SHE THOUGHT SHE COULD BE PREGNANT BECAUSE SHE DIDN'T HAVE A CYCLE BUT THEN IT JUST CAME OUT OF NOWHERE AND IT HAS BN REALLY HEAVY SINCE THEN. DOESN'T KNOW IF SHE NEEDS TO COME IN FOR LIKE A WH VISIT OR STI, SHE WANTED TO KNOW IF THIS COULD BE AN STD? PLEASE CALL BACK.

## 2020-10-17 NOTE — Telephone Encounter (Signed)
Call from patient. Patient reports he period started on 1/28 and it has been heavy with clots. Patient denies abdominal pain and discomfort. Patient had negative pregnancy on 01/27. Patient is concerned of pregnancy but her period is late but she does not remember when her period was in December. RN counseled patient that because she just had a negative pregnancy test to wait since her period just started and retake pregnancy test 2 weeks from last pregnancy test. RN scheduled patient for physical appt on 02/11. Patient is not on birth control but does want to become pregnancy within the next year. Patient instructed to go to urgent care is she starts to have severe pain or soaking up more than 4 pads in a hour. Patient express understanding of plan.   Harvie Heck, RN

## 2020-10-24 ENCOUNTER — Ambulatory Visit: Payer: Medicaid Other

## 2020-11-28 ENCOUNTER — Emergency Department: Payer: Medicaid Other

## 2020-11-28 ENCOUNTER — Encounter
Admission: EM | Disposition: A | Discharge status: Home / Self Care | Payer: Self-pay | Source: Home / Self Care | Attending: Emergency Medicine

## 2020-11-28 ENCOUNTER — Emergency Department: Payer: Medicaid Other | Admitting: Anesthesiology

## 2020-11-28 ENCOUNTER — Other Ambulatory Visit: Payer: Self-pay

## 2020-11-28 ENCOUNTER — Ambulatory Visit
Admission: EM | Admit: 2020-11-28 | Discharge: 2020-11-28 | Disposition: A | Discharge status: Home / Self Care | Payer: Medicaid Other | Attending: Emergency Medicine | Admitting: Emergency Medicine

## 2020-11-28 DIAGNOSIS — N939 Abnormal uterine and vaginal bleeding, unspecified: Secondary | ICD-10-CM | POA: Diagnosis present

## 2020-11-28 DIAGNOSIS — Z20822 Contact with and (suspected) exposure to covid-19: Secondary | ICD-10-CM | POA: Insufficient documentation

## 2020-11-28 DIAGNOSIS — Z79899 Other long term (current) drug therapy: Secondary | ICD-10-CM | POA: Insufficient documentation

## 2020-11-28 DIAGNOSIS — Z539 Procedure and treatment not carried out, unspecified reason: Secondary | ICD-10-CM | POA: Diagnosis not present

## 2020-11-28 LAB — TYPE AND SCREEN
ABO/RH(D): O POS
Antibody Screen: NEGATIVE

## 2020-11-28 LAB — BASIC METABOLIC PANEL
Anion gap: 5 (ref 5–15)
BUN: 14 mg/dL (ref 6–20)
CO2: 25 mmol/L (ref 22–32)
Calcium: 8.9 mg/dL (ref 8.9–10.3)
Chloride: 106 mmol/L (ref 98–111)
Creatinine, Ser: 0.81 mg/dL (ref 0.44–1.00)
GFR, Estimated: >60 mL/min (ref >60)
Glucose, Bld: 73 mg/dL (ref 70–99)
Potassium: 4.1 mmol/L (ref 3.5–5.1)
Sodium: 136 mmol/L (ref 135–145)

## 2020-11-28 LAB — WET PREP, GENITAL
Clue Cells Wet Prep HPF POC: NONE SEEN
Sperm: NONE SEEN
Trich, Wet Prep: NONE SEEN
Yeast Wet Prep HPF POC: NONE SEEN

## 2020-11-28 LAB — CBC
HCT: 27 % — ABNORMAL LOW (ref 36.0–46.0)
Hemoglobin: 7.5 g/dL — ABNORMAL LOW (ref 12.0–15.0)
MCH: 20.7 pg — ABNORMAL LOW (ref 26.0–34.0)
MCHC: 27.8 g/dL — ABNORMAL LOW (ref 30.0–36.0)
MCV: 74.6 fL — ABNORMAL LOW (ref 80.0–100.0)
Platelets: 448 10*3/uL — ABNORMAL HIGH (ref 150–400)
RBC: 3.62 MIL/uL — ABNORMAL LOW (ref 3.87–5.11)
RDW: 16.7 % — ABNORMAL HIGH (ref 11.5–15.5)
WBC: 12.6 10*3/uL — ABNORMAL HIGH (ref 4.0–10.5)
nRBC: 0 % (ref 0.0–0.2)

## 2020-11-28 LAB — CHLAMYDIA/NGC RT PCR (ARMC ONLY)
Chlamydia Tr: NOT DETECTED
N gonorrhoeae: DETECTED — AB

## 2020-11-28 LAB — ABO/RH: ABO/RH(D): O POS

## 2020-11-28 LAB — RESP PANEL BY RT-PCR (FLU A&B, COVID) ARPGX2
Influenza A by PCR: NEGATIVE
Influenza B by PCR: NEGATIVE
SARS Coronavirus 2 by RT PCR: NEGATIVE

## 2020-11-28 LAB — HCG, QUANTITATIVE, PREGNANCY: hCG, Beta Chain, Quant, S: <1 m[IU]/mL (ref <5)

## 2020-11-28 LAB — POC URINE PREG, ED: Preg Test, Ur: NEGATIVE

## 2020-11-28 SURGERY — DILATION AND EVACUATION, UTERUS
Anesthesia: General

## 2020-11-28 MED ORDER — PROPOFOL 500 MG/50ML IV EMUL
INTRAVENOUS | Status: AC
  Filled 2020-11-28: qty 50

## 2020-11-28 MED ORDER — LACTATED RINGERS IV SOLN: INTRAVENOUS | Status: DC

## 2020-11-28 MED ORDER — POVIDONE-IODINE 10 % EX SWAB: 2.0000 "application " | Freq: Once | CUTANEOUS | Status: DC

## 2020-11-28 MED ORDER — ONDANSETRON 4 MG PO TBDP: 4.0000 mg | ORAL_TABLET | Freq: Four times a day (QID) | ORAL | 0 refills | Status: DC | PRN

## 2020-11-28 MED ORDER — PROPOFOL 10 MG/ML IV BOLUS
INTRAVENOUS | Status: AC
  Filled 2020-11-28: qty 20

## 2020-11-28 MED ORDER — FENTANYL CITRATE (PF) 100 MCG/2ML IJ SOLN
INTRAMUSCULAR | Status: AC
  Filled 2020-11-28: qty 2

## 2020-11-28 MED ORDER — NORGESTIMATE-ETH ESTRADIOL 0.25-35 MG-MCG PO TABS: ORAL_TABLET | ORAL | 6 refills | Status: DC

## 2020-11-28 MED ORDER — MIDAZOLAM HCL 2 MG/2ML IJ SOLN
INTRAMUSCULAR | Status: AC
  Filled 2020-11-28: qty 2

## 2020-11-28 MED ORDER — LEVONORGESTREL 20 MCG/24HR IU IUD
1.0000 | INTRAUTERINE_SYSTEM | Freq: Once | INTRAUTERINE | Status: DC
  Filled 2020-11-28: qty 1

## 2020-11-28 MED ORDER — METHYLERGONOVINE MALEATE 0.2 MG/ML IJ SOLN
INTRAMUSCULAR | Status: AC
  Filled 2020-11-28: qty 1

## 2020-11-28 SURGICAL SUPPLY — 22 items
CATH ROBINSON RED A/P 16FR (CATHETERS) ×2 IMPLANT
COVER WAND RF STERILE (DRAPES) ×2 IMPLANT
FILTER UTR ASPR SPEC (MISCELLANEOUS) ×1 IMPLANT
FLTR UTR ASPR SPEC (MISCELLANEOUS) ×2
GLOVE SURG ENC MOIS LTX SZ7 (GLOVE) ×2 IMPLANT
GLOVE SURG UNDER LTX SZ7.5 (GLOVE) ×2 IMPLANT
GOWN STRL REUS W/ TWL LRG LVL3 (GOWN DISPOSABLE) ×2 IMPLANT
GOWN STRL REUS W/TWL LRG LVL3 (GOWN DISPOSABLE) ×2
KIT BERKELEY 1ST TRIMESTER 3/8 (MISCELLANEOUS) ×2 IMPLANT
KIT TURNOVER CYSTO (KITS) ×2 IMPLANT
MANIFOLD NEPTUNE II (INSTRUMENTS) ×2 IMPLANT
PACK DNC HYST (MISCELLANEOUS) ×2 IMPLANT
PAD OB MATERNITY 4.3X12.25 (PERSONAL CARE ITEMS) ×2 IMPLANT
PAD PREP 24X41 OB/GYN DISP (PERSONAL CARE ITEMS) ×2 IMPLANT
SET BERKELEY SUCTION TUBING (SUCTIONS) ×2 IMPLANT
TOWEL OR 17X26 4PK STRL BLUE (TOWEL DISPOSABLE) ×2 IMPLANT
VACURETTE 10 RIGID CVD (CANNULA) IMPLANT
VACURETTE 6 ASPIR F TIP BERK (CANNULA) IMPLANT
VACURETTE 7MM F TIP (CANNULA)
VACURETTE 7MM F TIP STRL (CANNULA) IMPLANT
VACURETTE 8 RIGID CVD (CANNULA) IMPLANT
VACURETTE 8MM F TIP (MISCELLANEOUS) ×2 IMPLANT

## 2020-11-28 NOTE — ED Notes (Signed)
Patient is resting comfortably. 

## 2020-11-28 NOTE — Progress Notes (Signed)
Blood bank called about ABO. States they have blood to add on another ABO prior to surgery

## 2020-11-28 NOTE — ED Triage Notes (Addendum)
Pt comes with c/o vaginal bleeding for 2 weeks now. Pt states she thinks she had a  miscarriage about month ago. Pt states she took at home pregnancy test on Jan 26 and it was positive. Pt states she never went to OBGYN since she started bleeding and just assumed she had miscarriage.  Pt states she is bleeding heavily and large clots. Pt states she is using about 4-5 tampons in a day.

## 2020-11-28 NOTE — ED Notes (Signed)
Pt showed this RN and EDT Mayra clots while using restroom. Pt had heavy bleeding and several clots present on toilet tissue.  EDT informed First nurse Megan.

## 2020-11-28 NOTE — ED Notes (Signed)
Patient reports last meal around 9am. Patient reports cereal and milk. Patient denies food or drink except ice chips since 9am.

## 2020-11-28 NOTE — ED Notes (Signed)
Patient transported to Ultrasound. Warm blanket applied.

## 2020-11-28 NOTE — Consult Note (Signed)
Consult History and Physical   SERVICE: Gynecology   Patient Name: Julie Mcclain Patient MRN:   [id number]  CC: Heavy vaginal bleeding  HPI: Julie Mcclain is a 25 y.o. G0P0000 with heavy vaginal bleeding for approx 6 weeks. LMP in early December (uncertain dates) and positive pregnancy test on January 26. No formal test. Her beta quant and urine preg test are negative today. Of note, her hgb is 7.5, down from last check 2 yrs ago at 11.8. Elevated plts.  Bleeding daily, now going through a pad q30 min for last two days. Tachy to 120s on admission, otherwise asx. Afebrile, no abdominal or pelvic pain.  She reports slight dizziness but no syncope. No visual changes or spots.  Hoping for pregnancy next year. Hx of trich but neg std testing today.  Body mass index is 48.82 kg/m.  Menses: usually regular q 28days, last for 3 days, not heavy, no dysmenorrhea  Ultrasound today with thickened endometrial stripe with heterogenous material (clots) and active bleeding noted exiting the endometrial cavity. +ovarian cysts bilaterally, no evidence of ectopic. Large simple cyst with small septation.  Review of Systems: positives in bold GEN:   fevers, chills, weight changes, appetite changes, fatigue, night sweats HEENT:  HA, vision changes, hearing loss, congestion, rhinorrhea, sinus pressure, dysphagia CV:   CP, palpitations PULM:  SOB, cough GI:  abd pain, N/V/D/C GU:  dysuria, urgency, frequency. +vaginal bleeding MSK:  arthralgias, myalgias, back pain, swelling SKIN:  rashes, color changes, pallor NEURO:  numbness, weakness, tingling, seizures, dizziness, tremors PSYCH:  depression, anxiety, behavioral problems, confusion  HEME/LYMPH:  easy bruising or bleeding ENDO:  heat/cold intolerance  Past Obstetrical History: OB History    Gravida  0   Para  0   Term  0   Preterm  0   AB  0   Living  0     SAB  0   IAB  0   Ectopic  0   Multiple  0   Live Births  0            Past Gynecologic History: No LMP recorded.   Past Medical History: Past Medical History:  Diagnosis Date  . Menometrorrhagia   . Obesity     Past Surgical History:   Past Surgical History:  Procedure Laterality Date  . denies      Family History:  family history is not on file.  Social History:  Social History   Socioeconomic History  . Marital status: Single    Spouse name: Not on file  . Number of children: Not on file  . Years of education: Not on file  . Highest education level: Not on file  Occupational History  . Not on file  Tobacco Use  . Smoking status: Never Smoker  . Smokeless tobacco: Never Used  Vaping Use  . Vaping Use: Never used  Substance and Sexual Activity  . Alcohol use: No  . Drug use: No  . Sexual activity: Yes    Birth control/protection: Condom  Other Topics Concern  . Not on file  Social History Narrative  . Not on file   Social Determinants of Health   Financial Resource Strain: Not on file  Food Insecurity: Not on file  Transportation Needs: Not on file  Physical Activity: Not on file  Stress: Not on file  Social Connections: Not on file  Intimate Partner Violence: Not At Risk  . Fear of Current or Ex-Partner: No  . Emotionally Abused:  No  . Physically Abused: No  . Sexually Abused: No    Home Medications:  Medications reconciled in EPIC  No current facility-administered medications on file prior to encounter.   Current Outpatient Medications on File Prior to Encounter  Medication Sig Dispense Refill  . ARIPiprazole (ABILIFY) 15 MG tablet Take 1 tablet by mouth daily.  2  . brompheniramine-pseudoephedrine-DM 30-2-10 MG/5ML syrup Take 10 mLs by mouth 4 (four) times daily as needed. (Patient not taking: Reported on 04/12/2017) 200 mL 0  . buPROPion (WELLBUTRIN XL) 300 MG 24 hr tablet Take 1 tablet by mouth daily.  0  . busPIRone (BUSPAR) 10 MG tablet Take 1 tablet by mouth 2 (two) times daily.  1  . dicyclomine  (BENTYL) 20 MG tablet Take 1 tablet (20 mg total) by mouth 3 (three) times daily as needed for spasms (Abdominal pain). (Patient not taking: Reported on 04/12/2017) 30 tablet 0  . esomeprazole (NEXIUM) 40 MG capsule Take 1 capsule (40 mg total) by mouth daily. 30 capsule 1  . Ferrous Fumarate 90 MG TABS Take 1 tablet (90 mg total) by mouth daily. (Patient not taking: Reported on 04/12/2017) 90 each 0  . guaiFENesin-codeine 100-10 MG/5ML syrup Take 5 mLs by mouth every 6 (six) hours as needed for cough. (Patient not taking: Reported on 04/03/2019) 120 mL 0  . lamoTRIgine (LAMICTAL) 200 MG tablet Take 1 tablet by mouth daily.  1  . naproxen (NAPROSYN) 500 MG tablet Take 1 tablet (500 mg total) by mouth 2 (two) times daily. 30 tablet 0  . norgestimate-ethinyl estradiol (ORTHO-CYCLEN) 0.25-35 MG-MCG tablet Take 1 tablet by mouth daily.    . norgestimate-ethinyl estradiol (ORTHO-CYCLEN, 28,) 0.25-35 MG-MCG tablet Take 1 tablet by mouth daily. 1 Package 3  . norgestimate-ethinyl estradiol (ORTHO-CYCLEN,SPRINTEC,PREVIFEM) 0.25-35 MG-MCG tablet Take 1 tablet by mouth daily. (Patient not taking: Reported on 04/04/2019) 1 Package 2  . ondansetron (ZOFRAN) 4 MG tablet Take 1-2 tabs by mouth every 8 hours as needed for nausea/vomiting (Patient not taking: Reported on 04/12/2017) 30 tablet 0  . promethazine (PHENERGAN) 12.5 MG tablet Take 1 tablet (12.5 mg total) by mouth every 6 (six) hours as needed for nausea or vomiting. (Patient not taking: Reported on 04/03/2019) 12 tablet 0  . ranitidine (ZANTAC) 150 MG capsule Take 1 capsule (150 mg total) by mouth 2 (two) times daily. 60 capsule 0  . ranitidine (ZANTAC) 75 MG tablet Take 1 tablet (75 mg total) by mouth 2 (two) times daily. (Patient not taking: Reported on 04/12/2017) 60 tablet 0  . VYVANSE 50 MG capsule Take 1 capsule by mouth daily.  0    Allergies:  No Known Allergies  Physical Exam:      General Appearance:  Well developed, well nourished, no acute  distress, alert and oriented x3 HEENT:  Normocephalic atraumatic, extraocular movements intact, moist mucous membranes Cardiovascular:  Normal S1/S2, regular rate and rhythm, no murmurs Pulmonary:  clear to auscultation, no wheezes, rales or rhonchi, symmetric air entry, good air exchange Abdomen:  Bowel sounds present, soft, nontender, nondistended, no abnormal masses, no epigastric pain Extremities:  Full range of motion, no pedal edema, 2+ distal pulses, no tenderness Skin:  normal coloration and turgor, no rashes, no suspicious skin lesions noted  Neurologic:  Cranial nerves 2-12 grossly intact, normal muscle tone,  Psychiatric:  Normal mood and affect, appropriate, no AH/VH Pelvic:  NEFG, no vulvar masses or lesions, normal vaginal mucosa, vault with significant vaginal bleeding, cervix without lesions or erythema  Labs/Studies:   CBC and Coags:  Lab Results  Component Value Date   WBC 12.6 (H) 11/28/2020   NEUTOPHILPCT 67 06/16/2016   EOSPCT 1 06/16/2016   BASOPCT 0 06/16/2016   LYMPHOPCT 26 06/16/2016   HGB 7.5 (L) 11/28/2020   HCT 27.0 (L) 11/28/2020   MCV 74.6 (L) 11/28/2020   PLT 448 (H) 11/28/2020   CMP:  Lab Results  Component Value Date   NA 136 11/28/2020   K 4.1 11/28/2020   CL 106 11/28/2020   CO2 25 11/28/2020   BUN 14 11/28/2020   CREATININE 0.81 11/28/2020   CREATININE 0.98 01/02/2018   CREATININE 0.83 09/15/2017   PROT 8.0 01/02/2018   BILITOT 0.5 01/02/2018   ALT 17 01/02/2018   AST 20 01/02/2018   ALKPHOS 81 01/02/2018    Other Imaging: US PELVIC COMPLETE WITH TRANSVAGINAL  Result Date: 11/28/2020 CLINICAL DATA:  Vaginal bleeding for the past 2 weeks. EXAM: TRANSABDOMINAL AND TRANSVAGINAL ULTRASOUND OF PELVIS TECHNIQUE: Both transabdominal and transvaginal ultrasound examinations of the pelvis were performed. Transabdominal technique was performed for global imaging of the pelvis including uterus, ovaries, adnexal regions, and pelvic cul-de-sac.  It was necessary to proceed with endovaginal exam following the transabdominal exam to visualize the endometrium and ovaries. COMPARISON:  CT abdomen pelvis dated May 08, 2017. Pelvic ultrasound dated April 16, 2015. FINDINGS: Uterus Measurements: 9.0 x 3.7 x 4.8 cm = volume: 85 mL. No fibroids or other mass visualized. Endometrium Thickness: 8 mm.  No focal abnormality visualized. Right ovary Measurements: 3.9 x 2.3 x 3.2 cm = volume: 14.4 mL. 2.6 x 1.9 x 2.0 cm simple cyst. Left ovary Measurements: 5.1 x 3.3 x 4.2 cm = volume: 37.1 mL. 3.8 x 2.8 x 3.4 cm simple cyst, previously 3.5 x 2.2 x 3.3 cm. 2.2 cm dominant follicle adjacent to the simple cyst. Other findings No abnormal free fluid. IMPRESSION: 1. No acute abnormality. 2. Bilateral ovarian simple cysts, the largest in the left ovary measures up to 3.8 cm, previously 3.5 cm. No follow up imaging recommended. Note: This recommendation does not apply to premenarchal patients or to those with increased risk (genetic, family history, elevated tumor markers or other high-risk factors) of ovarian cancer. Reference: Radiology 2019 Nov; 293(2):359-371. Electronically Signed   By: Obie Dredge M.D.   On: 11/28/2020 15:43     Assessment / Plan:   Julie Mcclain is a 25 y.o. G0P0000 who presents with heavy vaginal bleeding, negative pregnancy test and significant anemia.  We discussed options, including iv estrogen, OCP taper, D&C, IUD. We have decided on suction D&C with IUD placement in OR. She is aware that she will need to return to have the IUD removed prior to conception.  Risks of surgery were discussed with the patient including but not limited to: bleeding which may require transfusion; infection which may require antibiotics; injury to uterus or surrounding organs; intrauterine scarring which may impair future fertility; need for additional procedures including laparotomy or laparoscopy; and other postoperative/anesthesia complications. Written  informed consent was obtained.

## 2020-11-28 NOTE — ED Notes (Signed)
Dr. Larinda Buttery assisted with pelvic exam.

## 2020-11-28 NOTE — Discharge Instructions (Signed)
Intrauterine Device Information An intrauterine device (IUD) is a medical device that is inserted into the uterus to prevent pregnancy. It is a small, T-shaped device that has one or two nylon strings hanging down from it. The strings hang out of the lower part of the uterus (cervix) to allow for future IUD removal. There are two types of IUDs:  Hormone IUD. This type of IUD is made of plastic and contains the hormone progestin (synthetic progesterone). A hormone IUD may last 3-5 years.  Copper IUD. This type of IUD has copper wire wrapped around it. A copper IUD may last up to 10 years. How is an IUD inserted? An IUD is inserted through the vagina, through the cervix, and into the uterus with a minor medical procedure. The procedure for IUD insertion may vary among health care providers and hospitals. How does an IUD work? Synthetic progesterone in a hormonal IUD prevents pregnancy by:  Thickening cervical mucus to prevent sperm from entering the uterus.  Thinning the uterine lining to prevent a fertilized egg from being implanted there. Copper in a copper IUD prevents pregnancy by making the uterus and fallopian tubes produce a fluid that kills sperm. What are the advantages of an IUD? Advantages of either type of IUD An IUD:  Is highly effective in preventing pregnancy.  Is reversible. You can become pregnant shortly after the IUD is removed.  Is low-maintenance and can stay in place for a long time.  Has no estrogen-related side effects.  Can be used when breastfeeding.  Is not associated with weight gain.  Can be inserted right after childbirth, an abortion, or a miscarriage. Advantages of a hormone IUD  If it is inserted within 7 days of your period starting, it works right after it has been inserted. If the hormone IUD is inserted at any other time in your cycle, you will need to use a backup method of birth control for 7 days after insertion.  It can make menstrual  periods lighter or stop completely.  It can reduce menstrual cramping and other discomforts from menstrual periods.  It can be used for 3-5 years, depending on which IUD you have. Advantages of a copper IUD  It works right after it is inserted.  It can be used as a form of emergency birth control if it is inserted within 5 days after having unprotected sex.  It does not interfere with your body's natural hormones.  It can be used for up to 10 years. What are the disadvantages of an IUD?  An IUD may cause irregular menstrual bleeding for a period of time after insertion.  It is common to have pain during insertion and have cramping and vaginal bleeding after insertion.  An IUD may cut the uterus (uterine perforation) when it is inserted. This is rare.  Pelvic inflammatory disease (PID) may happen after insertion of an IUD. PID is an infection in the uterus and fallopian tubes. The IUD does not cause the infection. The infection is usually from an unknown sexually transmitted infection (STI). This is rare, and it usually happens during the first 20 days after the IUD is inserted.  A copper IUD can make your menstrual flow heavier and more painful.  IUDs cannot prevent sexually transmitted infections (STIs). How is an IUD removed?   You will lie on your back with your knees bent and your feet in footrests (stirrups).  A device will be inserted into your vagina to spread apart the vaginal  walls (speculum). This will allow your health care provider to see the strings attached to the IUD.  Your health care provider will use a small instrument (forceps) to grasp the IUD strings and will pull firmly until the IUD is removed. You may have some discomfort when the IUD is removed. Your health care provider may recommend taking over-the-counter pain relievers, such as ibuprofen, before the procedure. You may also have minor spotting for a few days after the procedure. The procedure for IUD  removal may vary among health care providers and hospitals. Is an IUD right for me? If you are interested in an IUD, discuss it with your health care provider. He or she will make sure you are a good candidate for an IUD and will let you know more about the advantages, disadvantage, and possible side effects. This will allow you to make a decision about the device. Summary  An intrauterine device (IUD) is a medical device that is inserted in the uterus to prevent pregnancy. It is a small, T-shaped device that has one or two nylon strings hanging down from it.  A hormone IUD contains the hormone progestin (synthetic progesterone). A copper IUD has copper wire wrapped around it.  Synthetic progesterone in a hormone IUD prevents pregnancy by thickening cervical mucus and thinning the walls of the uterus. Copper in a copper IUD prevents pregnancy by making the uterus and fallopian tubes produce a fluid that kills sperm.  A hormone IUD can be left in place for 3-5 years. A copper IUD can be left in place for up to 10 years.  An IUD is inserted and removed by a health care provider. You may feel some pain during insertion and removal. Your health care provider may recommend taking over-the-counter pain medicine, such as ibuprofen, before an IUD procedure. This information is not intended to replace advice given to you by your health care provider. Make sure you discuss any questions you have with your health care provider. Document Revised: 03/12/2020 Document Reviewed: 03/12/2020 Elsevier Patient Education  2021 [employer]    Dysfunctional Uterine Bleeding Dysfunctional uterine bleeding is abnormal bleeding from the uterus. Dysfunctional uterine bleeding includes:  A menstrual period that comes earlier or later than usual.  A menstrual period that is lighter or heavier than usual, or has large blood clots.  Vaginal bleeding between menstrual periods.  Skipping one or more menstrual  periods.  Vaginal bleeding after sex.  Vaginal bleeding after menopause. Follow these instructions at home: Eating and drinking  Eat well-balanced meals. Include foods that are high in iron, such as liver, meat, shellfish, green leafy vegetables, and eggs.  To prevent or treat constipation, your health care provider may recommend that you: ? Drink enough fluid to keep your urine pale yellow. ? Take over-the-counter or prescription medicines. ? Eat foods that are high in fiber, such as beans, whole grains, and fresh fruits and vegetables. ? Limit foods that are high in fat and processed sugars, such as fried or sweet foods.   Medicines  Take over-the-counter and prescription medicines only as told by your health care provider.  Do not change medicines without talking with your health care provider.  Aspirin or medicines that contain aspirin may make the bleeding worse. Do not take those medicines: ? During the week before your menstrual period. ? During your menstrual period.  If you were prescribed iron pills, take them as told by your health care provider. Iron pills help to replace iron that  your body loses because of this condition. Activity  If you need to change your sanitary pad or tampon more than one time every 2 hours: ? Lie in bed with your feet raised (elevated). ? Place a cold pack on your lower abdomen. ? Rest as much as possible until the bleeding stops or slows down.  Do not try to lose weight until the bleeding has stopped and your blood iron level is back to normal. General instructions  For two months, write down: ? When your menstrual period starts. ? When your menstrual period ends. ? When any abnormal vaginal bleeding occurs. ? What problems you notice.  Keep all follow up visits as told by your health care provider. This is important.   Contact a health care provider if you:  Feel light-headed or weak.  Have nausea and vomiting.  Cannot eat or  drink without vomiting.  Feel dizzy or have diarrhea while you are taking medicines.  Are taking birth control pills or hormones, and you want to change them or stop taking them. Get help right away if:  You develop a fever or chills.  You need to change your sanitary pad or tampon more than one time per hour.  Your vaginal bleeding becomes heavier, or your flow contains clots more often.  You develop pain in your abdomen.  You lose consciousness.  You develop a rash. Summary  Dysfunctional uterine bleeding is abnormal bleeding from the uterus.  It includes menstrual bleeding of abnormal duration, volume, or regularity.  Bleeding after sex and after menopause are also considered dysfunctional uterine bleeding. This information is not intended to replace advice given to you by your health care provider. Make sure you discuss any questions you have with your health care provider. Document Revised: 02/08/2018 Document Reviewed: 02/08/2018 Elsevier Patient Education  2021 [employer].

## 2020-11-28 NOTE — ED Notes (Signed)
Patient clothing removed at this time. Patient belongings bagged and stored at bedside. Patient notified that she would have to remove piercing as well, but states "I think I'm going to wait," and declined to let this RN assist with their removal at this time. Patient provided a specimen cup and bag for piercing.

## 2020-11-28 NOTE — ED Provider Notes (Signed)
[facility] Emergency Department Provider Note   ____________________________________________   Event Date/Time   First MD Initiated Contact with Patient 11/28/20 1237     (approximate)  I have reviewed the triage vital signs and the nursing notes.   HISTORY  Chief Complaint Vaginal Bleeding    HPI Julie Mcclain is a 25 y.o. female with no significant past medical history who presents to the ED complaining of vaginal bleeding.  Patient reports that she initially had a positive pregnancy test on January 26 after her LMP was in early December.  A few days after the positive pregnancy test, she developed vaginal bleeding.  She states it was fairly light initially but persisted for greater than 1 month.  Bleeding then became heavier earlier today while she was at work.  She reports having to change a pad about every 30 minutes today with passage of a few baseball sized clots.  She denies any lightheadedness, chest pain, or shortness of breath.  She has never had issues with heavy vaginal bleeding in the past and this would have been her first pregnancy.  She has not had any abdominal pain, pelvic pain, fevers, nausea, or vomiting.        Past Medical History:  Diagnosis Date  . Menometrorrhagia   . Obesity     There are no problems to display for this patient.   Past Surgical History:  Procedure Laterality Date  . denies      Prior to Admission medications   Medication Sig Start Date End Date Taking? Authorizing Provider  ARIPiprazole (ABILIFY) 15 MG tablet Take 1 tablet by mouth daily. 03/22/17   [provider]  brompheniramine-pseudoephedrine-DM 30-2-10 MG/5ML syrup Take 10 mLs by mouth 4 (four) times daily as needed. Patient not taking: Reported on 04/12/2017 01/03/16   Hagler, Jami L, PA-C  buPROPion (WELLBUTRIN XL) 300 MG 24 hr tablet Take 1 tablet by mouth daily. 03/22/17   [provider]  busPIRone (BUSPAR) 10 MG tablet Take 1  tablet by mouth 2 (two) times daily. 03/22/17   [provider]  dicyclomine (BENTYL) 20 MG tablet Take 1 tablet (20 mg total) by mouth 3 (three) times daily as needed for spasms (Abdominal pain). Patient not taking: Reported on 04/12/2017 07/25/16 07/25/17  Loleta Rose, MD  esomeprazole (NEXIUM) 40 MG capsule Take 1 capsule (40 mg total) by mouth daily. 05/08/17 05/08/18  Arnaldo Natal, MD  Ferrous Fumarate 90 MG TABS Take 1 tablet (90 mg total) by mouth daily. Patient not taking: Reported on 04/12/2017 05/13/15   Loleta Rose, MD  guaiFENesin-codeine 100-10 MG/5ML syrup Take 5 mLs by mouth every 6 (six) hours as needed for cough. Patient not taking: Reported on 04/03/2019 09/15/17   Minna Antis, MD  lamoTRIgine (LAMICTAL) 200 MG tablet Take 1 tablet by mouth daily. 03/22/17   [provider]  naproxen (NAPROSYN) 500 MG tablet Take 1 tablet (500 mg total) by mouth 2 (two) times daily. 09/20/19   Lorelee New, PA-C  norgestimate-ethinyl estradiol (ORTHO-CYCLEN) 0.25-35 MG-MCG tablet Take 1 tablet by mouth daily. 04/04/19 07/05/19  Larene Pickett, FNP  norgestimate-ethinyl estradiol (ORTHO-CYCLEN, 28,) 0.25-35 MG-MCG tablet Take 1 tablet by mouth daily. 04/04/19 07/05/19  Larene Pickett, FNP  norgestimate-ethinyl estradiol (ORTHO-CYCLEN,SPRINTEC,PREVIFEM) 0.25-35 MG-MCG tablet Take 1 tablet by mouth daily. Patient not taking: Reported on 04/04/2019 05/13/15   Loleta Rose, MD  ondansetron (ZOFRAN) 4 MG tablet Take 1-2 tabs by mouth every 8 hours as needed for nausea/vomiting  Patient not taking: Reported on 04/12/2017 07/25/16   Loleta Rose, MD  promethazine (PHENERGAN) 12.5 MG tablet Take 1 tablet (12.5 mg total) by mouth every 6 (six) hours as needed for nausea or vomiting. Patient not taking: Reported on 04/03/2019 01/02/18   Willy Eddy, MD  ranitidine (ZANTAC) 150 MG capsule Take 1 capsule (150 mg total) by mouth 2 (two) times daily. 01/02/18 02/01/18  Willy Eddy, MD   ranitidine (ZANTAC) 75 MG tablet Take 1 tablet (75 mg total) by mouth 2 (two) times daily. Patient not taking: Reported on 04/12/2017 10/12/15   Schaevitz, Myra Rude, MD  VYVANSE 50 MG capsule Take 1 capsule by mouth daily. 03/22/17   [provider]    Allergies Patient has no known allergies.  No family history on file.  Social History Social History   Tobacco Use  . Smoking status: Never Smoker  . Smokeless tobacco: Never Used  Vaping Use  . Vaping Use: Never used  Substance Use Topics  . Alcohol use: No  . Drug use: No    Review of Systems  Constitutional: No fever/chills Eyes: No visual changes. ENT: No sore throat. Cardiovascular: Denies chest pain. Respiratory: Denies shortness of breath. Gastrointestinal: No abdominal pain.  No nausea, no vomiting.  No diarrhea.  No constipation. Genitourinary: Negative for dysuria.  Positive for vaginal bleeding. Musculoskeletal: Negative for back pain. Skin: Negative for rash. Neurological: Negative for headaches, focal weakness or numbness.  ____________________________________________   PHYSICAL EXAM:  VITAL SIGNS: ED Triage Vitals  Enc Vitals Group     BP 11/28/20 1121 106/74     Pulse Rate 11/28/20 1121 (!) 126     Resp 11/28/20 1121 (!) 22     Temp 11/28/20 1121 98.4 F (36.9 C)     Temp src --      SpO2 11/28/20 1121 100 %     Weight --      Height --      Head Circumference --      Peak Flow --      Pain Score 11/28/20 1115 0     Pain Loc --      Pain Edu? --      Excl. in GC? --     Constitutional: Alert and oriented. Eyes: Conjunctivae are normal. Head: Atraumatic. Nose: No congestion/rhinnorhea. Mouth/Throat: Mucous membranes are moist. Neck: Normal ROM Cardiovascular: Normal rate, regular rhythm. Grossly normal heart sounds. Respiratory: Normal respiratory effort.  No retractions. Lungs CTAB. Gastrointestinal: Soft and nontender. No distention. Genitourinary: Moderate amount of  bleeding noted in vaginal vault with no visible tissue, cervical os closed. Musculoskeletal: No lower extremity tenderness nor edema. Neurologic:  Normal speech and language. No gross focal neurologic deficits are appreciated. Skin:  Skin is warm, dry and intact. No rash noted. Psychiatric: Mood and affect are normal. Speech and behavior are normal.  ____________________________________________   LABS (all labs ordered are listed, but only abnormal results are displayed)  Labs Reviewed  WET PREP, GENITAL - Abnormal; Notable for the following components:      Result Value   WBC, Wet Prep HPF POC FEW (*)    All other components within normal limits  CBC - Abnormal; Notable for the following components:   WBC 12.6 (*)    RBC 3.62 (*)    Hemoglobin 7.5 (*)    HCT 27.0 (*)    MCV 74.6 (*)    MCH 20.7 (*)    MCHC 27.8 (*)    RDW 16.7 (*)  Platelets 448 (*)    All other components within normal limits  CHLAMYDIA/NGC RT PCR ([facility] ONLY)  RESP PANEL BY RT-PCR (FLU A&B, COVID) ARPGX2  HCG, QUANTITATIVE, PREGNANCY  BASIC METABOLIC PANEL  POC URINE PREG, ED  TYPE AND SCREEN  ABO/RH    PROCEDURES  Procedure(s) performed (including Critical Care):  Procedures   ____________________________________________   INITIAL IMPRESSION / ASSESSMENT AND PLAN / ED COURSE       25 year old female with no significant past medical history presents to the ED complaining of vaginal bleeding ongoing for about the past month with heavier bleeding today.  Pelvic exam shows moderate amount of blood with no visible tissue, cervical os closed.  Patient currently hemodynamically stable and denies any symptoms of anemia, although hemoglobin noted to be 7.5, which is a significant drop from her previous.  Given she is asymptomatic with this anemia, we will hold off on transfusion.  We will further assess with ultrasound and case discussed with Dr. Dalbert Garnet of OB/GYN, who will evaluate the patient  and determine if D&C would be appropriate.  Patient evaluated by OBGYN, who will plan for D&C later this afternoon. Patient remains hemodynamically stable and agrees with plan.      ____________________________________________   FINAL CLINICAL IMPRESSION(S) / ED DIAGNOSES  Final diagnoses:  Vaginal bleeding     ED Discharge Orders    None       Note:  This document was prepared using Dragon voice recognition software and may include unintentional dictation errors.   Chesley Noon, MD 11/28/20 [phone]

## 2020-11-28 NOTE — ED Notes (Signed)
Patient transported to preop by ORT.

## 2020-11-28 NOTE — ED Notes (Signed)
Patient to preop

## 2020-11-28 NOTE — ED Notes (Signed)
Patient remains off the unit in ultrasound.

## 2020-11-28 NOTE — Anesthesia Preprocedure Evaluation (Signed)
Anesthesia Evaluation  Patient identified by MRN, date of birth, ID band Patient awake    Reviewed: Allergy & Precautions, NPO status , Patient's Chart, lab work & pertinent test results  Airway Mallampati: II  TM Distance: >3 FB     Dental  (+) Teeth Intact   Pulmonary neg pulmonary ROS,    Pulmonary exam normal        Cardiovascular negative cardio ROS Normal cardiovascular exam     Neuro/Psych negative neurological ROS  negative psych ROS   GI/Hepatic Neg liver ROS, GERD  ,  Endo/Other  negative endocrine ROSMorbid obesity  Renal/GU negative Renal ROS  Female GU complaint     Musculoskeletal negative musculoskeletal ROS (+)   Abdominal (+) + obese,   Peds negative pediatric ROS (+)  Hematology  (+) anemia ,   Anesthesia Other Findings Past Medical History: No date: Menometrorrhagia No date: Obesity  Reproductive/Obstetrics                             Anesthesia Physical Anesthesia Plan  ASA: II  Anesthesia Plan: General   Post-op Pain Management:    Induction: Intravenous, Rapid sequence and Cricoid pressure planned  PONV Risk Score and Plan:   Airway Management Planned: Oral ETT  Additional Equipment:   Intra-op Plan:   Post-operative Plan: Extubation in OR  Informed Consent: I have reviewed the patients History and Physical, chart, labs and discussed the procedure including the risks, benefits and alternatives for the proposed anesthesia with the patient or authorized representative who has indicated his/her understanding and acceptance.     Dental advisory given  Plan Discussed with: CRNA and Surgeon  Anesthesia Plan Comments:         Anesthesia Quick Evaluation

## 2020-11-28 NOTE — Progress Notes (Signed)
Patients AVS printed and IV out per RN, pt understands AVS instructions. Patient given belongings back ,patient wheeled out to mom.

## 2020-11-28 NOTE — ED Notes (Signed)
Patient reports heavy vaginal bleeding x 2 weeks. Patient reports LMP December. Patient reports + pregnancy test at home at the end of January, but reports she is concerned for miscarriage r/t heavy bleeding. Patient denies pain, but does report some pressure with passage of clots.

## 2020-12-01 ENCOUNTER — Ambulatory Visit: Payer: Medicaid Other | Admitting: Physician Assistant

## 2020-12-01 ENCOUNTER — Telehealth: Payer: Self-pay | Admitting: Emergency Medicine

## 2020-12-01 ENCOUNTER — Other Ambulatory Visit: Payer: Self-pay

## 2020-12-01 ENCOUNTER — Encounter: Payer: Self-pay | Admitting: Physician Assistant

## 2020-12-01 DIAGNOSIS — A5402 Gonococcal vulvovaginitis, unspecified: Secondary | ICD-10-CM

## 2020-12-01 DIAGNOSIS — Z113 Encounter for screening for infections with a predominantly sexual mode of transmission: Secondary | ICD-10-CM | POA: Diagnosis not present

## 2020-12-01 MED ORDER — CEFTRIAXONE SODIUM 500 MG IJ SOLR
500.0000 mg | Freq: Once | INTRAMUSCULAR | Status: AC
  Administered 2020-12-01: 500 mg via INTRAMUSCULAR

## 2020-12-01 NOTE — Progress Notes (Signed)
S:  Patient into clinic today requesting treatment for GC.  States that she went to the ED a few days ago and tested positive for GC.  Declines any screening at [facility] today and requests treatment only. O:  WDWN female in NAD, A&O x 3, normal work of breathing. A/P:  1.  Reviewed notes from ED visit and needs treatment for positive GC.  Chlamydia testing was negative. 2.  Treat for GC with Ceftriaxone 500 mg IM today. 3.  No sex for 14 days and until after partner completes treatment. 4.  Condoms with all sex. 5.  Call with questions or concerns. 6.  Follow up with GYN as per appointment and for re-screening in 3 months.

## 2020-12-01 NOTE — Telephone Encounter (Signed)
Called patient to inform of positive gonorrhea test and need for treatment.  I explained that she can be treated at [facility] for this and her partner can also be treated there.  She says she will call now.

## 2020-12-01 NOTE — Progress Notes (Signed)
Here for GC tx only.  Tested positive at ED Richmond Campbell, RN

## 2021-03-28 ENCOUNTER — Emergency Department
Admission: EM | Admit: 2021-03-28 | Discharge: 2021-03-28 | Disposition: A | Discharge status: Home / Self Care | Payer: Medicaid Other | Attending: Emergency Medicine | Admitting: Emergency Medicine

## 2021-03-28 ENCOUNTER — Emergency Department: Payer: Medicaid Other

## 2021-03-28 ENCOUNTER — Encounter: Payer: Self-pay | Admitting: Emergency Medicine

## 2021-03-28 ENCOUNTER — Other Ambulatory Visit: Payer: Self-pay

## 2021-03-28 DIAGNOSIS — D649 Anemia, unspecified: Secondary | ICD-10-CM | POA: Diagnosis not present

## 2021-03-28 DIAGNOSIS — N924 Excessive bleeding in the premenopausal period: Secondary | ICD-10-CM | POA: Diagnosis not present

## 2021-03-28 DIAGNOSIS — R350 Frequency of micturition: Secondary | ICD-10-CM | POA: Diagnosis present

## 2021-03-28 DIAGNOSIS — R42 Dizziness and giddiness: Secondary | ICD-10-CM | POA: Diagnosis not present

## 2021-03-28 DIAGNOSIS — N939 Abnormal uterine and vaginal bleeding, unspecified: Secondary | ICD-10-CM

## 2021-03-28 LAB — URINALYSIS, COMPLETE (UACMP) WITH MICROSCOPIC
Bacteria, UA: NONE SEEN
Bilirubin Urine: NEGATIVE
Glucose, UA: NEGATIVE mg/dL
Ketones, ur: NEGATIVE mg/dL
Leukocytes,Ua: NEGATIVE
Nitrite: NEGATIVE
Protein, ur: NEGATIVE mg/dL
RBC / HPF: >50 RBC/hpf — ABNORMAL HIGH (ref 0–5)
Specific Gravity, Urine: 1.024 (ref 1.005–1.030)
pH: 5 (ref 5.0–8.0)

## 2021-03-28 LAB — CBC WITH DIFFERENTIAL/PLATELET
Abs Immature Granulocytes: 0.03 10*3/uL (ref 0.00–0.07)
Basophils Absolute: 0 10*3/uL (ref 0.0–0.1)
Basophils Relative: 1 %
Eosinophils Absolute: 0.2 10*3/uL (ref 0.0–0.5)
Eosinophils Relative: 2 %
HCT: 26.2 % — ABNORMAL LOW (ref 36.0–46.0)
Hemoglobin: 6.9 g/dL — ABNORMAL LOW (ref 12.0–15.0)
Immature Granulocytes: 0 %
Lymphocytes Relative: 28 %
Lymphs Abs: 2.5 10*3/uL (ref 0.7–4.0)
MCH: 18.2 pg — ABNORMAL LOW (ref 26.0–34.0)
MCHC: 26.3 g/dL — ABNORMAL LOW (ref 30.0–36.0)
MCV: 69.1 fL — ABNORMAL LOW (ref 80.0–100.0)
Monocytes Absolute: 0.5 10*3/uL (ref 0.1–1.0)
Monocytes Relative: 5 %
Neutro Abs: 5.6 10*3/uL (ref 1.7–7.7)
Neutrophils Relative %: 64 %
Platelets: 465 10*3/uL — ABNORMAL HIGH (ref 150–400)
RBC: 3.79 MIL/uL — ABNORMAL LOW (ref 3.87–5.11)
RDW: 20.4 % — ABNORMAL HIGH (ref 11.5–15.5)
WBC: 8.8 10*3/uL (ref 4.0–10.5)
nRBC: 0 % (ref 0.0–0.2)

## 2021-03-28 LAB — CHLAMYDIA/NGC RT PCR (ARMC ONLY)
Chlamydia Tr: NOT DETECTED
N gonorrhoeae: NOT DETECTED

## 2021-03-28 LAB — COMPREHENSIVE METABOLIC PANEL WITH GFR
ALT: 15 U/L (ref 0–44)
AST: 21 U/L (ref 15–41)
Albumin: 3.9 g/dL (ref 3.5–5.0)
Alkaline Phosphatase: 61 U/L (ref 38–126)
Anion gap: 7 (ref 5–15)
BUN: 9 mg/dL (ref 6–20)
CO2: 26 mmol/L (ref 22–32)
Calcium: 8.7 mg/dL — ABNORMAL LOW (ref 8.9–10.3)
Chloride: 104 mmol/L (ref 98–111)
Creatinine, Ser: 0.86 mg/dL (ref 0.44–1.00)
GFR, Estimated: >60 mL/min
Glucose, Bld: 81 mg/dL (ref 70–99)
Potassium: 3.8 mmol/L (ref 3.5–5.1)
Sodium: 137 mmol/L (ref 135–145)
Total Bilirubin: 0.6 mg/dL (ref 0.3–1.2)
Total Protein: 7.9 g/dL (ref 6.5–8.1)

## 2021-03-28 LAB — WET PREP, GENITAL
Clue Cells Wet Prep HPF POC: NONE SEEN
Sperm: NONE SEEN
Trich, Wet Prep: NONE SEEN
Yeast Wet Prep HPF POC: NONE SEEN

## 2021-03-28 LAB — PREGNANCY, URINE: Preg Test, Ur: NEGATIVE

## 2021-03-28 LAB — PREPARE RBC (CROSSMATCH)

## 2021-03-28 MED ORDER — NORGESTIMATE-ETH ESTRADIOL 0.25-35 MG-MCG PO TABS: ORAL_TABLET | ORAL | 1 refills | Status: DC

## 2021-03-28 MED ORDER — SODIUM CHLORIDE 0.9 % IV SOLN: 10.0000 mL/h | Freq: Once | INTRAVENOUS | Status: DC

## 2021-03-28 NOTE — ED Triage Notes (Signed)
Pt reports concerned she may have a urinary infection because the last couple of days she has been urinating a lot.

## 2021-03-28 NOTE — ED Provider Notes (Signed)
-----------------------------------------   4:39 PM on 03/28/2021 ----------------------------------------- I have personally seen and evaluated the patient in conjunction with physician assistant Heron Sabins.  Currently patient appears well denies any symptoms at rest.  Ultrasound is reassuring.  Patient states a history of heavy periods, continues to have some bleeding currently although states much diminished from several days ago.  Given the patient's hemoglobin of 6.9 down from 7.5 several months ago we will transfuse 1 unit of blood.  I spoke to Dr. Dalbert Garnet regarding this patient.  We will start the patient on a Sprintec taper and then keep her on for 2 months or so.  Patient will follow up with Dr. Dalbert Garnet in the office.  I discussed return precautions.  Patient agreeable to plan of care.  CRITICAL CARE Performed by: Minna Antis   Total critical care time: 30 minutes  Critical care time was exclusive of separately billable procedures and treating other patients.  Critical care was necessary to treat or prevent imminent or life-threatening deterioration.  Critical care was time spent personally by me on the following activities: development of treatment plan with patient and/or surrogate as well as nursing, discussions with consultants, evaluation of patient's response to treatment, examination of patient, obtaining history from patient or surrogate, ordering and performing treatments and interventions, ordering and review of laboratory studies, ordering and review of radiographic studies, pulse oximetry and re-evaluation of patient's condition.   Final diagnosis: Symptomatic anemia Menorrhagia   Minna Antis, MD 04/05/21 1451

## 2021-03-28 NOTE — ED Notes (Signed)
Pt c/o feeling fatigued for weeks

## 2021-03-28 NOTE — ED Provider Notes (Signed)
[facility] Emergency Department Provider Note  ____________________________________________   Event Date/Time   First MD Initiated Contact with Patient 03/28/21 1203     (approximate)  I have reviewed the triage vital signs and the nursing notes.   HISTORY  Chief Complaint Urinary Frequency  HPI Julie Mcclain is a 25 y.o. female who presents to the emergency department for evaluation of multiple complaints.  First, patient states she has had urinary frequency over the last week.  She was seen at urgent care last week, prescribed antibiotics but states that she never filled them or took them but does not have a clear reason for this.  She states that her frequency seems to be worse at night, also complains of increased thirst.  She is endorsing lightheadedness as well as dizziness that began acutely today.  She denies any pain with urination.  She does state that she was treated in March for STD as well as miscarriage.  She completed course of antibiotic for this and denies any pelvic pain.  She does state that she has been on her period for the last week, states that it is not in her normal time, missed her cycle last month and has been heavy with noted clots.  She states that other than her history of this occurring in March, she does not usually have history of heavy cycles.  She denies any fever, nausea, vomiting, back pain, abdominal pain.         Past Medical History:  Diagnosis Date   Menometrorrhagia    Obesity     There are no problems to display for this patient.   Past Surgical History:  Procedure Laterality Date   denies      Prior to Admission medications   Medication Sig Start Date End Date Taking? Authorizing Provider  norgestimate-ethinyl estradiol (SPRINTEC 28) 0.25-35 MG-MCG tablet Take 1 tablet p.o. 3 times daily x3 days.  Take 1 tablet p.o. twice daily x2 days.  After which take 1 tablet daily until gone. 03/28/21  Yes Minna AntisPaduchowski,  Kevin, MD  ARIPiprazole (ABILIFY) 15 MG tablet Take 1 tablet by mouth daily. 03/22/17   [provider]  brompheniramine-pseudoephedrine-DM 30-2-10 MG/5ML syrup Take 10 mLs by mouth 4 (four) times daily as needed. Patient not taking: Reported on 04/12/2017 01/03/16   Hagler, Jami L, PA-C  buPROPion (WELLBUTRIN XL) 300 MG 24 hr tablet Take 1 tablet by mouth daily. 03/22/17   [provider]  busPIRone (BUSPAR) 10 MG tablet Take 1 tablet by mouth 2 (two) times daily. 03/22/17   [provider]  dicyclomine (BENTYL) 20 MG tablet Take 1 tablet (20 mg total) by mouth 3 (three) times daily as needed for spasms (Abdominal pain). Patient not taking: Reported on 04/12/2017 07/25/16 07/25/17  Loleta RoseForbach, Cory, MD  esomeprazole (NEXIUM) 40 MG capsule Take 1 capsule (40 mg total) by mouth daily. 05/08/17 05/08/18  Arnaldo NatalMalinda, Paul F, MD  Ferrous Fumarate 90 MG TABS Take 1 tablet (90 mg total) by mouth daily. Patient not taking: Reported on 04/12/2017 05/13/15   Loleta RoseForbach, Cory, MD  guaiFENesin-codeine 100-10 MG/5ML syrup Take 5 mLs by mouth every 6 (six) hours as needed for cough. Patient not taking: Reported on 04/03/2019 09/15/17   Minna AntisPaduchowski, Kevin, MD  lamoTRIgine (LAMICTAL) 200 MG tablet Take 1 tablet by mouth daily. 03/22/17   [provider]  naproxen (NAPROSYN) 500 MG tablet Take 1 tablet (500 mg total) by mouth 2 (two) times daily. 09/20/19   Chilton SiGreen,  Gerre Pebbles L, PA-C  ondansetron (ZOFRAN ODT) 4 MG disintegrating tablet Take 1 tablet (4 mg total) by mouth every 6 (six) hours as needed for nausea. 11/28/20   Christeen Douglas, MD  ondansetron [facility]) 4 MG tablet Take 1-2 tabs by mouth every 8 hours as needed for nausea/vomiting Patient not taking: Reported on 04/12/2017 07/25/16   Loleta Rose, MD  promethazine (PHENERGAN) 12.5 MG tablet Take 1 tablet (12.5 mg total) by mouth every 6 (six) hours as needed for nausea or vomiting. Patient not taking: Reported on 04/03/2019 01/02/18   Willy Eddy, MD  ranitidine (ZANTAC) 150 MG capsule Take 1 capsule (150 mg total) by mouth 2 (two) times daily. 01/02/18 02/01/18  Willy Eddy, MD  ranitidine (ZANTAC) 75 MG tablet Take 1 tablet (75 mg total) by mouth 2 (two) times daily. Patient not taking: Reported on 04/12/2017 10/12/15   Schaevitz, Myra Rude, MD  VYVANSE 50 MG capsule Take 1 capsule by mouth daily. 03/22/17   [provider]    Allergies Patient has no known allergies.  No family history on file.  Social History Social History   Tobacco Use   Smoking status: Never   Smokeless tobacco: Never  Vaping Use   Vaping Use: Never used  Substance Use Topics   Alcohol use: No   Drug use: No    Review of Systems Constitutional: No fever/chills Eyes: No visual changes. ENT: No sore throat. Cardiovascular: Denies chest pain. Respiratory: Denies shortness of breath. Gastrointestinal: No abdominal pain.  No nausea, no vomiting.  No diarrhea.  No constipation. Genitourinary: + Urinary frequency, denies vaginal discharge, negative for dysuria. Musculoskeletal: Negative for back pain. Skin: Negative for rash. Neurological: + Lightheaded, + dizzy, negative for headaches, focal weakness or numbness.   ____________________________________________   PHYSICAL EXAM:  VITAL SIGNS: ED Triage Vitals  Enc Vitals Group     BP 03/28/21 1141 122/79     Pulse Rate 03/28/21 1141 98     Resp 03/28/21 1141 20     Temp 03/28/21 1141 98.3 F (36.8 C)     Temp Source 03/28/21 1141 Oral     SpO2 03/28/21 1141 99 %     Weight 03/28/21 1142 290 lb (131.5 kg)     Height 03/28/21 1142 5\' 3"  (1.6 m)     Head Circumference --      Peak Flow --      Pain Score 03/28/21 1142 0     Pain Loc --      Pain Edu? --      Excl. in GC? --    Constitutional: Alert and oriented. Well appearing and in no acute distress. Eyes: Conjunctivae are normal. PERRL. EOMI. Head: Atraumatic. Nose: No congestion/rhinnorhea. Mouth/Throat:  Mucous membranes are moist.  Oropharynx non-erythematous. Neck: No stridor.   Cardiovascular: Normal rate, regular rhythm. Grossly normal heart sounds.  Good peripheral circulation. Respiratory: Normal respiratory effort.  No retractions. Lungs CTAB. Gastrointestinal: Soft and nontender. No distention. No abdominal bruits. No CVA tenderness. Genitourinary: Bloody discharge noted in the vaginal vault, no significant purulent discharge, normal-appearing cervix. Musculoskeletal: No lower extremity tenderness nor edema.  No joint effusions. Neurologic:  Normal speech and language. No gross focal neurologic deficits are appreciated. No gait instability. Skin:  Skin is warm, dry and intact. No rash noted. Psychiatric: Mood and affect are normal. Speech and behavior are normal.  ____________________________________________   LABS (all labs ordered are listed, but only abnormal results are displayed)  Labs Reviewed  WET  PREP, GENITAL - Abnormal; Notable for the following components:      Result Value   WBC, Wet Prep HPF POC FEW (*)    All other components within normal limits  URINALYSIS, COMPLETE (UACMP) WITH MICROSCOPIC - Abnormal; Notable for the following components:   Color, Urine YELLOW (*)    APPearance HAZY (*)    Hgb urine dipstick LARGE (*)    RBC / HPF >50 (*)    All other components within normal limits  COMPREHENSIVE METABOLIC PANEL - Abnormal; Notable for the following components:   Calcium 8.7 (*)    All other components within normal limits  CBC WITH DIFFERENTIAL/PLATELET - Abnormal; Notable for the following components:   RBC 3.79 (*)    Hemoglobin 6.9 (*)    HCT 26.2 (*)    MCV 69.1 (*)    MCH 18.2 (*)    MCHC 26.3 (*)    RDW 20.4 (*)    Platelets 465 (*)    All other components within normal limits  CHLAMYDIA/NGC RT PCR ([facility] ONLY)            PREGNANCY, URINE  TYPE AND SCREEN  PREPARE RBC (CROSSMATCH)    ____________________________________________  RADIOLOGY   Official radiology report(s): US PELVIC COMPLETE WITH TRANSVAGINAL  Result Date: 03/28/2021 CLINICAL DATA:  Vaginal bleeding EXAM: TRANSABDOMINAL AND TRANSVAGINAL ULTRASOUND OF PELVIS TECHNIQUE: Both transabdominal and transvaginal ultrasound examinations of the pelvis were performed. Transabdominal technique was performed for global imaging of the pelvis including uterus, ovaries, adnexal regions, and pelvic cul-de-sac. It was necessary to proceed with endovaginal exam following the transabdominal exam to visualize the uterus endometrium ovaries. COMPARISON:  Ultrasound 11/28/2020 FINDINGS: Uterus Measurements: 8.1 x 3.6 x 3.9 cm = volume: 59.7 mL. Nabothian cysts in the cervix. Endometrium Thickness: 12 mm.  No focal abnormality visualized. Right ovary Measurements: 5.3 x 3.6 x 4.6 cm = volume: 45.5 mL. Septated cyst versus 2 adjacent cysts measuring 4.8 x 2.7 x 4.2 cm. Left ovary Not seen Other findings No abnormal free fluid. IMPRESSION: 1. Endometrial thickness of 12 mm. If bleeding remains unresponsive to hormonal or medical therapy, sonohysterogram should be considered for focal lesion work-up. (Ref: Radiological Reasoning: Algorithmic Workup of Abnormal Vaginal Bleeding with Endovaginal Sonography and Sonohysterography. AJR 2008; 960:A54-09) 2. 4.8 cm right ovarian cyst. No follow up imaging recommended. Note: This recommendation does not apply to premenarchal patients or to those with increased risk (genetic, family history, elevated tumor markers or other high-risk factors) of ovarian cancer. Reference: Radiology 2019 Nov; 293(2):359-371. Electronically Signed   By: Jasmine Pang M.D.   On: 03/28/2021 15:25      ____________________________________________   INITIAL IMPRESSION / ASSESSMENT AND PLAN / ED COURSE  As part of my medical decision making, I reviewed the following data within the electronic MEDICAL RECORD NUMBER Nursing  notes reviewed and incorporated, Labs reviewed, signed out to attending Dr. Lenard Lance, and Notes from prior ED visits        Patient is a 25 year old female who presents to the emergency department for evaluation of urinary frequency with heavy vaginal bleeding over the last week.  She did endorse some lightheadedness and dizziness that began today.  See HPI for further details.  In triage patient has grossly normal vital signs.  Physical exam as above.  Prior ER visit from March 2022 was reviewed at which time patient also had heavy vaginal bleeding and a hemoglobin noted of 7.5.  She was not transfused at that time given  no symptoms of her anemia.  She was also found at that time to be positive for gonorrhea, treated outpatient at the health department.  Given this, laboratory evaluation was obtained with CBC, CMP, urinalysis, wet prep, GC/chlamydia.  Her GC/chlamydia and wet prep are grossly benign.  CMP reassuring.  Her CBC does demonstrate a hemoglobin of 6.9, decreased from prior.  Ultrasound was also obtained and demonstrates a thickened endometrium of 12 mm, recommending follow-up with OB/GYN if no improvement on hormonal therapy.  She is also noted to have a 4.8 cm right-sided ovarian cyst.  Given the above findings, do feel the patient is having symptomatic anemia now with decreased hemoglobin, lightheadedness and dizziness that began today.  At this time, will hand the patient off to attending Dr. Lenard Lance on the major side of the emergency department with plans for blood transfusion.  Patient is stable at this time for transition in the department.      ____________________________________________   FINAL CLINICAL IMPRESSION(S) / ED DIAGNOSES  Final diagnoses:  Symptomatic anemia  Excessive bleeding in premenopausal period     ED Discharge Orders          Ordered    norgestimate-ethinyl estradiol (SPRINTEC 28) 0.25-35 MG-MCG tablet        03/28/21 1805              Note:  This document was prepared using Dragon voice recognition software and may include unintentional dictation errors.    Lucy Chris, PA 03/29/21 1884    Minna Antis, MD 04/05/21 1400

## 2021-03-28 NOTE — Discharge Instructions (Addendum)
Please take care birth control prescription as prescribed.  Please follow-up with Dr. Dalbert Garnet by calling the number provided to arrange a follow-up appointment.  Return to the emergency department for any further weakness, if you feel like you are going to pass out or any other symptom personally concerning to yourself.

## 2021-03-28 NOTE — ED Notes (Addendum)
Pt went to UC was diagnosed with kidney infection. They did a UP as well and was negative.  They prescribed meds. Pt did not fill those meds. Pt is also non compliant with her prescribed medications for her bi-polar and anxiety. Pt was seen in march for miscarriage. Pt is currently on her period as well. Pt denies pain when she urinates. She is having frequent urination at nighttime and increased thirst during the day.   Today she is here bc she wants to know "what is wrong with me".   EDP at bedside.

## 2021-03-29 LAB — BPAM RBC
Blood Product Expiration Date: 202208122359
ISSUE DATE / TIME: 202207161829
Unit Type and Rh: 5100

## 2021-03-29 LAB — TYPE AND SCREEN
ABO/RH(D): O POS
Antibody Screen: NEGATIVE
Unit division: 0

## 2021-04-17 ENCOUNTER — Other Ambulatory Visit: Payer: Self-pay

## 2021-04-17 ENCOUNTER — Encounter: Payer: Self-pay | Admitting: Emergency Medicine

## 2021-04-17 ENCOUNTER — Emergency Department
Admission: EM | Admit: 2021-04-17 | Discharge: 2021-04-17 | Disposition: A | Discharge status: Home / Self Care | Payer: Medicaid Other | Attending: Emergency Medicine | Admitting: Emergency Medicine

## 2021-04-17 DIAGNOSIS — N938 Other specified abnormal uterine and vaginal bleeding: Secondary | ICD-10-CM | POA: Insufficient documentation

## 2021-04-17 DIAGNOSIS — N9489 Other specified conditions associated with female genital organs and menstrual cycle: Secondary | ICD-10-CM | POA: Diagnosis not present

## 2021-04-17 DIAGNOSIS — N939 Abnormal uterine and vaginal bleeding, unspecified: Secondary | ICD-10-CM | POA: Insufficient documentation

## 2021-04-17 LAB — HCG, QUANTITATIVE, PREGNANCY: hCG, Beta Chain, Quant, S: <1 m[IU]/mL (ref <5)

## 2021-04-17 LAB — TYPE AND SCREEN
ABO/RH(D): O POS
Antibody Screen: NEGATIVE

## 2021-04-17 LAB — CBC
HCT: 28.7 % — ABNORMAL LOW (ref 36.0–46.0)
Hemoglobin: 7.8 g/dL — ABNORMAL LOW (ref 12.0–15.0)
MCH: 19.7 pg — ABNORMAL LOW (ref 26.0–34.0)
MCHC: 27.2 g/dL — ABNORMAL LOW (ref 30.0–36.0)
MCV: 72.5 fL — ABNORMAL LOW (ref 80.0–100.0)
Platelets: 479 10*3/uL — ABNORMAL HIGH (ref 150–400)
RBC: 3.96 MIL/uL (ref 3.87–5.11)
RDW: 23.1 % — ABNORMAL HIGH (ref 11.5–15.5)
WBC: 12.4 10*3/uL — ABNORMAL HIGH (ref 4.0–10.5)
nRBC: 0 % (ref 0.0–0.2)

## 2021-04-17 LAB — POC URINE PREG, ED: Preg Test, Ur: NEGATIVE

## 2021-04-17 NOTE — ED Provider Notes (Signed)
[facility] Emergency Department Provider Note   ____________________________________________    I have reviewed the triage vital signs and the nursing notes.   HISTORY  Chief Complaint Vaginal Bleeding     HPI Julie Mcclain is a 25 y.o. female who presents with complaints of vaginal bleeding.  Patient reports this is been ongoing for quite some time.  Patient was seen in the emergency department in July 16 for vaginal bleeding and was started on Sprintec with close GYN follow-up recommended.  The patient has not follow-up with GYN.  She reports continued intermittent bleeding occasionally with blood clots.  No dizziness or lightheadedness or palpitations.  Past Medical History:  Diagnosis Date   Menometrorrhagia    Obesity     There are no problems to display for this patient.   Past Surgical History:  Procedure Laterality Date   denies      Prior to Admission medications   Medication Sig Start Date End Date Taking? Authorizing Provider  ARIPiprazole (ABILIFY) 15 MG tablet Take 1 tablet by mouth daily. 03/22/17   [provider]  buPROPion (WELLBUTRIN XL) 300 MG 24 hr tablet Take 1 tablet by mouth daily. 03/22/17   [provider]  busPIRone (BUSPAR) 10 MG tablet Take 1 tablet by mouth 2 (two) times daily. 03/22/17   [provider]  esomeprazole (NEXIUM) 40 MG capsule Take 1 capsule (40 mg total) by mouth daily. 05/08/17 05/08/18  Arnaldo Natal, MD  lamoTRIgine (LAMICTAL) 200 MG tablet Take 1 tablet by mouth daily. 03/22/17   [provider]  naproxen (NAPROSYN) 500 MG tablet Take 1 tablet (500 mg total) by mouth 2 (two) times daily. 09/20/19   Lorelee New, PA-C  norgestimate-ethinyl estradiol (SPRINTEC 28) 0.25-35 MG-MCG tablet Take 1 tablet p.o. 3 times daily x3 days.  Take 1 tablet p.o. twice daily x2 days.  After which take 1 tablet daily until gone. 03/28/21   Minna Antis, MD  ondansetron (ZOFRAN  ODT) 4 MG disintegrating tablet Take 1 tablet (4 mg total) by mouth every 6 (six) hours as needed for nausea. 11/28/20   Christeen Douglas, MD  ranitidine (ZANTAC) 150 MG capsule Take 1 capsule (150 mg total) by mouth 2 (two) times daily. 01/02/18 02/01/18  Willy Eddy, MD  VYVANSE 50 MG capsule Take 1 capsule by mouth daily. 03/22/17   [provider]     Allergies Patient has no known allergies.  No family history on file.  Social History Social History   Tobacco Use   Smoking status: Never   Smokeless tobacco: Never  Vaping Use   Vaping Use: Never used  Substance Use Topics   Alcohol use: No   Drug use: No    Review of Systems  Constitutional: No fever/chills Eyes: No visual changes.  ENT: No sore throat. Cardiovascular: Denies chest pain. Respiratory: Denies shortness of breath. Gastrointestinal: No abdominal pain.  No nausea, no vomiting.   Genitourinary: As above Musculoskeletal: Negative for back pain. Skin: Negative for rash. Neurological: Negative for headaches or weakness   ____________________________________________   PHYSICAL EXAM:  VITAL SIGNS: ED Triage Vitals  Enc Vitals Group     BP 04/17/21 0829 124/81     Pulse Rate 04/17/21 0829 100     Resp 04/17/21 0829 20     Temp 04/17/21 0829 97.7 F (36.5 C)     Temp Source 04/17/21 0829 Oral     SpO2 04/17/21 0829 99 %  Weight 04/17/21 0814 131.5 kg (289 lb 14.5 oz)     Height 04/17/21 0814 1.6 m (5\' 3" )     Head Circumference --      Peak Flow --      Pain Score 04/17/21 0814 5     Pain Loc --      Pain Edu? --      Excl. in GC? --     Constitutional: Alert and oriented. No acute distress. Pleasant and interactive  Nose: No congestion/rhinnorhea. Mouth/Throat: Mucous membranes are moist.    Cardiovascular: Normal rate, regular rhythm.   Good peripheral circulation. Respiratory: Normal respiratory effort.  No retractions.  Gastrointestinal: Soft and nontender. No distention.   No CVA tenderness. Genitourinary: deferred Musculoskeletal: Warm and well perfused Neurologic:  Normal speech and language. No gross focal neurologic deficits are appreciated.  Skin:  Skin is warm, dry and intact. No rash noted. Psychiatric: Mood and affect are normal. Speech and behavior are normal.  ____________________________________________   LABS (all labs ordered are listed, but only abnormal results are displayed)  Labs Reviewed  CBC - Abnormal; Notable for the following components:      Result Value   WBC 12.4 (*)    Hemoglobin 7.8 (*)    HCT 28.7 (*)    MCV 72.5 (*)    MCH 19.7 (*)    MCHC 27.2 (*)    RDW 23.1 (*)    Platelets 479 (*)    All other components within normal limits  HCG, QUANTITATIVE, PREGNANCY  POC URINE PREG, ED  TYPE AND SCREEN   ____________________________________________  EKG  None ____________________________________________  RADIOLOGY  None ____________________________________________   PROCEDURES  Procedure(s) performed: No  Procedures   Critical Care performed: No ____________________________________________   INITIAL IMPRESSION / ASSESSMENT AND PLAN / ED COURSE  Pertinent labs & imaging results that were available during my care of the patient were reviewed by me and considered in my medical decision making (see chart for details).   Patient's hemoglobin here is stable and improved from prior, emphasized the need for her to continue taking the Sprintec and to follow-up with GYN as discussed prior.  No indication for blood transfusion or further work-up at this time.    ____________________________________________   FINAL CLINICAL IMPRESSION(S) / ED DIAGNOSES  Final diagnoses:  Dysfunctional uterine bleeding        Note:  This document was prepared using Dragon voice recognition software and may include unintentional dictation errors.    06/17/21, MD 04/17/21 1040

## 2021-04-17 NOTE — ED Notes (Signed)
BLOOD LABS AND UA SENT TO LAB.

## 2021-04-17 NOTE — ED Notes (Signed)
Says just had period and now still bleeding.  Clots.  Has been seen for this before.  On bcps from previous visit.  In nad.

## 2021-04-17 NOTE — ED Triage Notes (Signed)
C/O heavy vaginal bleeding x 2 weeks.  AAOx3.  Skin warm and dry.NAD

## 2021-05-04 ENCOUNTER — Encounter: Payer: Self-pay | Admitting: Obstetrics and Gynecology

## 2021-05-04 ENCOUNTER — Ambulatory Visit (INDEPENDENT_AMBULATORY_CARE_PROVIDER_SITE_OTHER): Payer: Medicaid Other | Admitting: Obstetrics and Gynecology

## 2021-05-04 ENCOUNTER — Other Ambulatory Visit (HOSPITAL_COMMUNITY)
Admission: RE | Admit: 2021-05-04 | Discharge: 2021-05-04 | Disposition: A | Discharge status: Home / Self Care | Payer: Medicaid Other | Source: Ambulatory Visit | Attending: Obstetrics and Gynecology | Admitting: Obstetrics and Gynecology

## 2021-05-04 ENCOUNTER — Other Ambulatory Visit: Payer: Self-pay

## 2021-05-04 VITALS — BP 130/60 | Ht 63.0 in | Wt 290.0 lb

## 2021-05-04 DIAGNOSIS — Z124 Encounter for screening for malignant neoplasm of cervix: Secondary | ICD-10-CM | POA: Diagnosis not present

## 2021-05-04 DIAGNOSIS — Z3202 Encounter for pregnancy test, result negative: Secondary | ICD-10-CM

## 2021-05-04 DIAGNOSIS — N921 Excessive and frequent menstruation with irregular cycle: Secondary | ICD-10-CM | POA: Diagnosis not present

## 2021-05-04 DIAGNOSIS — D500 Iron deficiency anemia secondary to blood loss (chronic): Secondary | ICD-10-CM

## 2021-05-04 DIAGNOSIS — N939 Abnormal uterine and vaginal bleeding, unspecified: Secondary | ICD-10-CM

## 2021-05-04 DIAGNOSIS — Z30013 Encounter for initial prescription of injectable contraceptive: Secondary | ICD-10-CM | POA: Diagnosis not present

## 2021-05-04 LAB — POCT URINE PREGNANCY: Preg Test, Ur: NEGATIVE

## 2021-05-04 LAB — POCT HEMOGLOBIN: Hemoglobin: 6.4 g/dL — AB (ref 11–14.6)

## 2021-05-04 MED ORDER — MEDROXYPROGESTERONE ACETATE 150 MG/ML IM SUSY
150.0000 mg | PREFILLED_SYRINGE | Freq: Once | INTRAMUSCULAR | 0 refills | Status: DC
Start: 2021-05-04 — End: 2021-08-24

## 2021-05-04 NOTE — Progress Notes (Signed)
Julie Mcclain   Chief Complaint  Patient presents with   Vaginal Bleeding    Pt bled for almost one month, first time that happens    HPI:      Julie Mcclain is a 25 y.o. G0P0000 whose LMP was No LMP recorded., presents today for NP eval of irregular bleeding/menorrhagia. Menses were monthly, lasting 3-4 days, light flow, small clots, minimal dysmen prior to "SAB 3/22". Seen in ED 3/22 for heavy bleeding and pt states Julie Mcclain had a miscarriage but on review, pregnancy tests and GYN u/s were negative. Was told to f/u with Dr. Dalbert Garnet but never done. Pt tested positive for gonorrhea 3/22 and was treated with rocephin at [facility]. Pt had heavy bleeding and was started on sprintec 3/22. Pt states menses were monthly on pills, lasting 5-6 days, heavier flow with LARGE clots, and worsening dysmen, improved with ibup. Pt went to ED 7/22 for heavier bleeding and was given sprintec taper as well as iron infusion due to decreasing HgB from 7.5 to 6.9. Not taking Fe supp. Pt then had 8/22 menses that started on time but Julie Mcclain bled daily for a few wks, stopping about 1-2 wks ago; heavy flow and large clots, mod dysmen improved with NSAIDs. Stopped sprintec about 1-2 wks ago as well due to flow and clots. No late/missed pills.  Julie Mcclain was not sex active until yesterday, using a condom. No pain/bleeding, no new partners. Julie Mcclain would like to conceive but not for a few yrs. Neg gon/chlam TOC 7/22 in ED.   03/28/21 GYN u/s: IMPRESSION: 1. Endometrial thickness of 12 mm. If bleeding remains unresponsive to hormonal or medical therapy, sonohysterogram should be considered for focal lesion work-up. (Ref: Radiological Reasoning: Algorithmic Workup of Abnormal Vaginal Bleeding with Endovaginal Sonography and Sonohysterography. AJR 2008; 151:V61-60) 2. 4.8 cm right ovarian cyst. No follow up imaging recommended.   NOTES FROM Epic REVIEWED FROM 3/22 TO PRESENT  Past Medical History:  Diagnosis Date   Menometrorrhagia     Obesity     Past Surgical History:  Procedure Laterality Date   denies      History reviewed. No pertinent family history.  Social History   Socioeconomic History   Marital status: Single    Spouse name: Not on file   Number of children: Not on file   Years of education: Not on file   Highest education level: Not on file  Occupational History   Not on file  Tobacco Use   Smoking status: Never   Smokeless tobacco: Never  Vaping Use   Vaping Use: Never used  Substance and Sexual Activity   Alcohol use: No   Drug use: No   Sexual activity: Not Currently    Birth control/protection: Pill  Other Topics Concern   Not on file  Social History Narrative   Not on file   Social Determinants of Health   Financial Resource Strain: Not on file  Food Insecurity: Not on file  Transportation Needs: Not on file  Physical Activity: Not on file  Stress: Not on file  Social Connections: Not on file  Intimate Partner Violence: Not At Risk   Fear of Current or Ex-Partner: No   Emotionally Abused: No   Physically Abused: No   Sexually Abused: No    Outpatient Medications Prior to Visit  Medication Sig Dispense Refill   ARIPiprazole (ABILIFY) 15 MG tablet Take 1 tablet by mouth daily.  2   buPROPion (WELLBUTRIN XL) 300 MG 24  hr tablet Take 1 tablet by mouth daily.  0   busPIRone (BUSPAR) 10 MG tablet Take 1 tablet by mouth 2 (two) times daily.  1   lamoTRIgine (LAMICTAL) 200 MG tablet Take 1 tablet by mouth daily.  1   naproxen (NAPROSYN) 500 MG tablet Take 1 tablet (500 mg total) by mouth 2 (two) times daily. 30 tablet 0   norgestimate-ethinyl estradiol (SPRINTEC 28) 0.25-35 MG-MCG tablet Take 1 tablet p.o. 3 times daily x3 days.  Take 1 tablet p.o. twice daily x2 days.  After which take 1 tablet daily until gone. 28 tablet 1   ondansetron (ZOFRAN ODT) 4 MG disintegrating tablet Take 1 tablet (4 mg total) by mouth every 6 (six) hours as needed for nausea. 20 tablet 0   VYVANSE 50  MG capsule Take 1 capsule by mouth daily.  0   esomeprazole (NEXIUM) 40 MG capsule Take 1 capsule (40 mg total) by mouth daily. 30 capsule 1   ranitidine (ZANTAC) 150 MG capsule Take 1 capsule (150 mg total) by mouth 2 (two) times daily. 60 capsule 0   No facility-administered medications prior to visit.      ROS:  Review of Systems  Constitutional:  Negative for fever.  Gastrointestinal:  Negative for blood in stool, constipation, diarrhea, nausea and vomiting.  Genitourinary:  Positive for menstrual problem. Negative for dyspareunia, dysuria, flank pain, frequency, hematuria, urgency, vaginal bleeding, vaginal discharge and vaginal pain.  Musculoskeletal:  Negative for back pain.  Skin:  Negative for rash.  BREAST: No symptoms   OBJECTIVE:   Vitals:  BP 130/60   Ht 5\' 3"  (1.6 m)   Wt 290 lb (131.5 kg)   BMI 51.37 kg/m   Physical Exam Vitals reviewed.  Constitutional:      Appearance: Julie Mcclain is well-developed.  Pulmonary:     Effort: Pulmonary effort is normal.  Genitourinary:    General: Normal vulva.     Pubic Area: No rash.      Labia:        Right: No rash, tenderness or lesion.        Left: No rash, tenderness or lesion.      Vagina: Normal. No vaginal discharge, erythema, tenderness or bleeding.     Cervix: Normal.     Uterus: Normal. Not enlarged and not tender.      Adnexa: Right adnexa normal and left adnexa normal.       Right: No mass or tenderness.         Left: No mass or tenderness.    Musculoskeletal:        General: Normal range of motion.     Cervical back: Normal range of motion.  Skin:    General: Skin is warm and dry.  Neurological:     General: No focal deficit present.     Mental Status: Julie Mcclain is alert and oriented to person, place, and time.  Psychiatric:        Mood and Affect: Mood normal.        Behavior: Behavior normal.        Thought Content: Thought content normal.        Judgment: Judgment normal.    Results: Results for orders  placed or performed in visit on 05/04/21 (from the past 24 hour(s))  POCT hemoglobin     Status: Abnormal   Collection Time: 05/04/21  4:19 PM  Result Value Ref Range   Hemoglobin 6.4 (A) 11 - 14.6 g/dL  POCT  urine pregnancy     Status: Normal   Collection Time: 05/04/21  4:19 PM  Result Value Ref Range   Preg Test, Ur Negative Negative     Assessment/Plan: Menometrorrhagia - Plan: medroxyPROGESTERone Acetate 150 MG/ML SUSY, Ambulatory referral to Hematology / Oncology; since 3/22, neg UPT, low HgB; not controlled with OCPs. Neg GYN u/s for pathology. Discussed other hormonal options, pt would like to try depo. Rx eRxd. RTO tomorrow for injection; condoms for 7 days. Recheck UPT in 1 mo. Refer to hematology for worsening IDA. Add MVI with Fe in meantime, although not going to be sufficient dose. F/u in 3 months/sooner prn. If sx persist, will check other labs.  Abnormal uterine bleeding (AUB) - Plan: POCT urine pregnancy  Cervical cancer screening - Plan: Cytology - PAP  Encounter for initial prescription of injectable contraceptive - Plan: medroxyPROGESTERone Acetate 150 MG/ML SUSY; Rx eRxd. Start tomorrow.  Iron deficiency anemia due to chronic blood loss - Plan: Ambulatory referral to Hematology / Oncology, POCT hemoglobin    Meds ordered this encounter  Medications   medroxyPROGESTERone Acetate 150 MG/ML SUSY    Sig: Inject 1 mL (150 mg total) into the muscle once for 1 dose.    Dispense:  1 mL    Refill:  0    Order Specific Question:   Supervising Provider    Answer:   Nadara Mustard [[id number]]      Return in about 1 day (around 05/05/2021) for RN visit tomorrow for depo inj/3 mo f/u with me for menorrhagia.  Harleyquinn Gasser B. Arletta Lumadue, PA-C 05/04/2021 4:39 PM

## 2021-05-05 ENCOUNTER — Ambulatory Visit: Payer: Medicaid Other

## 2021-05-06 LAB — CYTOLOGY - PAP: Diagnosis: NEGATIVE

## 2021-05-11 ENCOUNTER — Ambulatory Visit: Payer: Medicaid Other

## 2021-05-15 NOTE — Progress Notes (Deleted)
[facility]  Telephone:([phone]) [phone] [phone]) [phone]  ID: Su Monks OB: 1996/01/27  MR#: [id number]  GLO#:[id number]  Patient Care Team: Grace Isaac as PCP - General (General Practice)  CHIEF COMPLAINT: Iron deficiency anemia.  INTERVAL HISTORY: ***  REVIEW OF SYSTEMS:   ROS  As per HPI. Otherwise, a complete review of systems is negative.  PAST MEDICAL HISTORY: Past Medical History:  Diagnosis Date   Menometrorrhagia    Obesity     PAST SURGICAL HISTORY: Past Surgical History:  Procedure Laterality Date   denies      FAMILY HISTORY: No family history on file.  ADVANCED DIRECTIVES (Y/N):  N  HEALTH MAINTENANCE: Social History   Tobacco Use   Smoking status: Never   Smokeless tobacco: Never  Vaping Use   Vaping Use: Never used  Substance Use Topics   Alcohol use: No   Drug use: No     Colonoscopy:  PAP:  Bone density:  Lipid panel:  No Known Allergies  Current Outpatient Medications  Medication Sig Dispense Refill   ARIPiprazole (ABILIFY) 15 MG tablet Take 1 tablet by mouth daily.  2   buPROPion (WELLBUTRIN XL) 300 MG 24 hr tablet Take 1 tablet by mouth daily.  0   busPIRone (BUSPAR) 10 MG tablet Take 1 tablet by mouth 2 (two) times daily.  1   esomeprazole (NEXIUM) 40 MG capsule Take 1 capsule (40 mg total) by mouth daily. 30 capsule 1   lamoTRIgine (LAMICTAL) 200 MG tablet Take 1 tablet by mouth daily.  1   medroxyPROGESTERone Acetate 150 MG/ML SUSY Inject 1 mL (150 mg total) into the muscle once for 1 dose. 1 mL 0   naproxen (NAPROSYN) 500 MG tablet Take 1 tablet (500 mg total) by mouth 2 (two) times daily. 30 tablet 0   norgestimate-ethinyl estradiol (SPRINTEC 28) 0.25-35 MG-MCG tablet Take 1 tablet p.o. 3 times daily x3 days.  Take 1 tablet p.o. twice daily x2 days.  After which take 1 tablet daily until gone. 28 tablet 1   ondansetron (ZOFRAN ODT) 4 MG disintegrating tablet Take 1 tablet (4 mg total) by mouth every  6 (six) hours as needed for nausea. 20 tablet 0   ranitidine (ZANTAC) 150 MG capsule Take 1 capsule (150 mg total) by mouth 2 (two) times daily. 60 capsule 0   VYVANSE 50 MG capsule Take 1 capsule by mouth daily.  0   No current facility-administered medications for this visit.    OBJECTIVE: There were no vitals filed for this visit.   There is no height or weight on file to calculate BMI.    ECOG FS:{CHL ONC [id number]  General: Well-developed, well-nourished, no acute distress. Eyes: Pink conjunctiva, anicteric sclera. HEENT: Normocephalic, moist mucous membranes. Lungs: No audible wheezing or coughing. Heart: Regular rate and rhythm. Abdomen: Soft, nontender, no obvious distention. Musculoskeletal: No edema, cyanosis, or clubbing. Neuro: Alert, answering all questions appropriately. Cranial nerves grossly intact. Skin: No rashes or petechiae noted. Psych: Normal affect. Lymphatics: No cervical, calvicular, axillary or inguinal LAD.   LAB RESULTS:  Lab Results  Component Value Date   NA 137 03/28/2021   K 3.8 03/28/2021   CL 104 03/28/2021   CO2 26 03/28/2021   GLUCOSE 81 03/28/2021   BUN 9 03/28/2021   CREATININE 0.86 03/28/2021   CALCIUM 8.7 (L) 03/28/2021   PROT 7.9 03/28/2021   ALBUMIN 3.9 03/28/2021   AST 21 03/28/2021   ALT 15 03/28/2021   ALKPHOS 61 03/28/2021  BILITOT 0.6 03/28/2021   GFRNONAA >60 03/28/2021   GFRAA >60 01/02/2018    Lab Results  Component Value Date   WBC 12.4 (H) 04/17/2021   NEUTROABS 5.6 03/28/2021   HGB 6.4 (A) 05/04/2021   HCT 28.7 (L) 04/17/2021   MCV 72.5 (L) 04/17/2021   PLT 479 (H) 04/17/2021     STUDIES: No results found.  ASSESSMENT: Iron deficiency anemia.   PLAN:    Iron deficiency anemia:  Patient expressed understanding and was in agreement with this plan. She also understands that She can call clinic at any time with any questions, concerns, or complaints.   Cancer Staging No matching staging  information was found for the patient.  Jeralyn Ruths, MD   05/15/2021 12:15 PM

## 2021-05-19 ENCOUNTER — Inpatient Hospital Stay: Payer: Medicaid Other | Admitting: Oncology

## 2021-05-19 ENCOUNTER — Inpatient Hospital Stay: Payer: Medicaid Other

## 2021-05-19 DIAGNOSIS — D500 Iron deficiency anemia secondary to blood loss (chronic): Secondary | ICD-10-CM

## 2021-07-21 ENCOUNTER — Ambulatory Visit: Payer: Medicaid Other

## 2021-07-27 ENCOUNTER — Ambulatory Visit: Payer: Medicaid Other

## 2021-07-30 ENCOUNTER — Other Ambulatory Visit: Payer: Self-pay

## 2021-07-30 ENCOUNTER — Encounter: Payer: Self-pay | Admitting: Obstetrics and Gynecology

## 2021-07-30 ENCOUNTER — Ambulatory Visit (INDEPENDENT_AMBULATORY_CARE_PROVIDER_SITE_OTHER): Payer: Medicaid Other | Admitting: Obstetrics and Gynecology

## 2021-07-30 VITALS — BP 140/80 | Ht 63.0 in | Wt 295.0 lb

## 2021-07-30 DIAGNOSIS — N910 Primary amenorrhea: Secondary | ICD-10-CM

## 2021-07-30 DIAGNOSIS — Z3202 Encounter for pregnancy test, result negative: Secondary | ICD-10-CM

## 2021-07-30 DIAGNOSIS — Z3169 Encounter for other general counseling and advice on procreation: Secondary | ICD-10-CM

## 2021-07-30 DIAGNOSIS — R350 Frequency of micturition: Secondary | ICD-10-CM | POA: Diagnosis not present

## 2021-07-30 DIAGNOSIS — D500 Iron deficiency anemia secondary to blood loss (chronic): Secondary | ICD-10-CM | POA: Diagnosis not present

## 2021-07-30 DIAGNOSIS — N912 Amenorrhea, unspecified: Secondary | ICD-10-CM

## 2021-07-30 LAB — POCT URINE PREGNANCY: Preg Test, Ur: NEGATIVE

## 2021-07-30 LAB — POCT URINALYSIS DIPSTICK
Bilirubin, UA: NEGATIVE
Blood, UA: NEGATIVE
Glucose, UA: NEGATIVE
Ketones, UA: NEGATIVE
Leukocytes, UA: NEGATIVE
Nitrite, UA: NEGATIVE
Protein, UA: NEGATIVE
Spec Grav, UA: 1.025 (ref 1.010–1.025)
pH, UA: 5 (ref 5.0–8.0)

## 2021-07-30 LAB — POCT HEMOGLOBIN: Hemoglobin: 7 g/dL — AB (ref 11–14.6)

## 2021-07-30 NOTE — Progress Notes (Signed)
Julie Mcclain, Julie Mcclain   Chief Complaint  Patient presents with   Urinary Tract Infection    Frequency urinating, abdominal pain. Wants UPT. Hasn't had a cycle since sept    HPI:      [name] is a 25 y.o. G0P0000 whose LMP was Patient's last menstrual period was 05/27/2021 (approximate)., presents today for urinary frequency with decreased flow for a few days, no dysuria, LBP, pelvic pain, fevers, hematuria, no vag sx. Drinks lots of caffeine. Had UTI in past, sx not similar.   Pt also with amenorrhea since 9/22. Was seen 8/22 for AUB on OCPs, had neg eval except worsening IDA. Planned to change to depo but pt decided she would like to conceive and [facility]. No menses since. Menses prior to OCPs were monthly, lasting 3-14 days, mod to heavy flow, no BTB, mild dysmen, improved with NSAIDs.  Pt with hx of IDA and HgB=6.4 at 8/22 visit. Pt referred to hematology but never went. Not taking Fe supp, not taking PNVs.    Patient Active Problem List   Diagnosis Date Noted   Iron deficiency anemia due to chronic blood loss 05/04/2021   Menometrorrhagia 05/04/2021    Past Surgical History:  Procedure Laterality Date   denies      History reviewed. No pertinent family history.  Social History   Socioeconomic History   Marital status: Single    Spouse name: Not on file   Number of children: Not on file   Years of education: Not on file   Highest education level: Not on file  Occupational History   Not on file  Tobacco Use   Smoking status: Never   Smokeless tobacco: Never  Vaping Use   Vaping Use: Never used  Substance and Sexual Activity   Alcohol use: No   Drug use: No   Sexual activity: Yes    Birth control/protection: None, Condom  Other Topics Concern   Not on file  Social History Narrative   Not on file   Social Determinants of Health   Financial Resource Strain: Not on file  Food Insecurity: Not on file  Transportation Needs: Not on file   Physical Activity: Not on file  Stress: Not on file  Social Connections: Not on file  Intimate Partner Violence: Not At Risk   Fear of Current or Ex-Partner: No   Emotionally Abused: No   Physically Abused: No   Sexually Abused: No    Outpatient Medications Prior to Visit  Medication Sig Dispense Refill   ARIPiprazole (ABILIFY) 15 MG tablet Take 1 tablet by mouth daily.  2   buPROPion (WELLBUTRIN XL) 300 MG 24 hr tablet Take 1 tablet by mouth daily.  0   busPIRone (BUSPAR) 10 MG tablet Take 1 tablet by mouth 2 (two) times daily.  1   lamoTRIgine (LAMICTAL) 200 MG tablet Take 1 tablet by mouth daily.  1   naproxen (NAPROSYN) 500 MG tablet Take 1 tablet (500 mg total) by mouth 2 (two) times daily. 30 tablet 0   VYVANSE 50 MG capsule Take 1 capsule by mouth daily.  0   esomeprazole (NEXIUM) 40 MG capsule Take 1 capsule (40 mg total) by mouth daily. 30 capsule 1   medroxyPROGESTERone Acetate 150 MG/ML SUSY Inject 1 mL (150 mg total) into the muscle once for 1 dose. 1 mL 0   ranitidine (ZANTAC) 150 MG capsule Take 1 capsule (150 mg total) by mouth 2 (two) times daily. 60  capsule 0   norgestimate-ethinyl estradiol (SPRINTEC 28) 0.25-35 MG-MCG tablet Take 1 tablet p.o. 3 times daily x3 days.  Take 1 tablet p.o. twice daily x2 days.  After which take 1 tablet daily until gone. 28 tablet 1   ondansetron (ZOFRAN ODT) 4 MG disintegrating tablet Take 1 tablet (4 mg total) by mouth every 6 (six) hours as needed for nausea. 20 tablet 0   No facility-administered medications prior to visit.      ROS:  Review of Systems  Constitutional:  Negative for fever.  Gastrointestinal:  Negative for blood in stool, constipation, diarrhea, nausea and vomiting.  Genitourinary:  Positive for frequency and menstrual problem. Negative for dyspareunia, dysuria, flank pain, hematuria, urgency, vaginal bleeding, vaginal discharge and vaginal pain.  Musculoskeletal:  Negative for back pain.  Skin:  Negative for  rash.  BREAST: No symptoms   OBJECTIVE:   Vitals:  BP 140/80   Ht 5\' 3"  (1.6 m)   Wt 295 lb (133.8 kg)   LMP 05/27/2021 (Approximate)   BMI 52.26 kg/m   Physical Exam Vitals reviewed.  Constitutional:      Appearance: She is well-developed.  Pulmonary:     Effort: Pulmonary effort is normal.  Musculoskeletal:        General: Normal range of motion.     Cervical back: Normal range of motion.  Skin:    General: Skin is warm and dry.  Neurological:     General: No focal deficit present.     Mental Status: She is alert and oriented to person, place, and time.     Cranial Nerves: No cranial nerve deficit.  Psychiatric:        Mood and Affect: Mood normal.        Behavior: Behavior normal.        Thought Content: Thought content normal.        Judgment: Judgment normal.    Results: Results for orders placed or performed in visit on 07/30/21 (from the past 24 hour(s))  POCT hemoglobin     Status: Abnormal   Collection Time: 07/30/21  2:26 PM  Result Value Ref Range   Hemoglobin 7.0 (A) 11 - 14.6 g/dL  POCT urine pregnancy     Status: Normal   Collection Time: 07/30/21  2:36 PM  Result Value Ref Range   Preg Test, Ur Negative Negative  POCT Urinalysis Dipstick     Status: Normal   Collection Time: 07/30/21  2:58 PM  Result Value Ref Range   Color, UA yellow    Clarity, UA clear    Glucose, UA Negative Negative   Bilirubin, UA neg    Ketones, UA neg    Spec Grav, UA 1.025 1.010 - 1.025   Blood, UA neg    pH, UA 5.0 5.0 - 8.0   Protein, UA Negative Negative   Urobilinogen, UA     Nitrite, UA neg    Leukocytes, UA Negative Negative   Appearance     Odor       Assessment/Plan: Urinary frequency - Plan: Urine Culture, POCT Urinalysis Dipstick, neg UA. Check C&S. Will f/u if pos. D/C caffeine in meantime. F/u sooner if sx worsen.  Amenorrhea - Plan: POCT urine pregnancy; neg UPT, since stopping OCPs 8/22. Had monthly menses prior to OCPs. See if sx period  resumes by 12/22. If not, will do provera challenge after neg UPT.   Iron deficiency anemia due to chronic blood loss - Plan: POCT hemoglobin, Ambulatory referral  to Hematology / Oncology, CBC with Differential/Platelet, Iron, TIBC and Ferritin Panel; check labs today, refer back to hematology. Add Fe supp OTC. Discussed importance of treating IDA especially if wants pregnancy.   Pre-conception counseling - Plan: Ambulatory referral to Hematology / Oncology; start PNVs and Fe supp. May be best she isn't pregnant yet since she is anemic. Also discussed diet/wt loss for pregnancy, as well as reviewed her meds. Pt needs to stop vyvanse and zantac with pregnancy.    Return if symptoms worsen or fail to improve.  Yailyn Strack B. Kaamil Morefield, PA-C 07/30/2021 3:48 PM

## 2021-07-31 LAB — CBC WITH DIFFERENTIAL/PLATELET
Basophils Absolute: 0 10*3/uL (ref 0.0–0.2)
Basos: 0 %
EOS (ABSOLUTE): 0.2 10*3/uL (ref 0.0–0.4)
Eos: 2 %
Hematocrit: 26.1 % — ABNORMAL LOW (ref 34.0–46.6)
Hemoglobin: 6.7 g/dL — CL (ref 11.1–15.9)
Immature Grans (Abs): 0 10*3/uL (ref 0.0–0.1)
Immature Granulocytes: 0 %
Lymphocytes Absolute: 1.8 10*3/uL (ref 0.7–3.1)
Lymphs: 17 %
MCH: 16.9 pg — ABNORMAL LOW (ref 26.6–33.0)
MCHC: 25.7 g/dL — ABNORMAL LOW (ref 31.5–35.7)
MCV: 66 fL — ABNORMAL LOW (ref 79–97)
Monocytes Absolute: 0.9 10*3/uL (ref 0.1–0.9)
Monocytes: 8 %
Neutrophils Absolute: 7.7 10*3/uL — ABNORMAL HIGH (ref 1.4–7.0)
Neutrophils: 73 %
Platelets: 322 10*3/uL (ref 150–450)
RBC: 3.97 x10E6/uL (ref 3.77–5.28)
RDW: 19.5 % — ABNORMAL HIGH (ref 11.7–15.4)
WBC: 10.7 10*3/uL (ref 3.4–10.8)

## 2021-07-31 LAB — IRON,TIBC AND FERRITIN PANEL
Ferritin: 3 ng/mL — ABNORMAL LOW (ref 15–150)
Iron Saturation: 3 % — CL (ref 15–55)
Iron: 15 ug/dL — ABNORMAL LOW (ref 27–159)
Total Iron Binding Capacity: 455 ug/dL — ABNORMAL HIGH (ref 250–450)
UIBC: 440 ug/dL — ABNORMAL HIGH (ref 131–425)

## 2021-08-03 LAB — URINE CULTURE

## 2021-08-17 ENCOUNTER — Inpatient Hospital Stay: Payer: Medicaid Other

## 2021-08-17 ENCOUNTER — Inpatient Hospital Stay: Payer: Medicaid Other | Attending: Internal Medicine | Admitting: Internal Medicine

## 2021-08-24 ENCOUNTER — Telehealth: Payer: Self-pay | Admitting: Obstetrics and Gynecology

## 2021-08-24 DIAGNOSIS — N939 Abnormal uterine and vaginal bleeding, unspecified: Secondary | ICD-10-CM

## 2021-08-24 MED ORDER — NORETHINDRONE ACETATE 5 MG PO TABS: 5.0000 mg | ORAL_TABLET | Freq: Every day | ORAL | 0 refills | Status: DC

## 2021-08-24 NOTE — Telephone Encounter (Signed)
Pt with hx of amenorrhea since 9/22. Had AUB 8/22 and seen in ED. Had neg GYN u/s 7/22. Period started spontaneously 08/13/21 and has been bleeding for 12 days now. Flow is heavy, changing products Q1-2 hrs, with large clots. No dysmen. No UPT done. Taking Fe supp. Instructed pt to take UPT. If neg, start Rx aygestin for 7 days to stop bleeding. F/u if bleeding persists. If not, let's see if menses resume to monthly.   07/30/21 NOTE: Pt also with amenorrhea since 9/22. Was seen 8/22 for AUB on OCPs, had neg eval except worsening IDA. Planned to change to depo but pt decided she would like to conceive and went off [facility] altogether. No menses since. Menses prior to OCPs were monthly, lasting 3-14 days, mod to heavy flow, no BTB, mild dysmen, improved with NSAIDs.  05/04/21 NOTE: Menses were monthly, lasting 3-4 days, light flow, small clots, minimal dysmen prior to "SAB 3/22". Seen in ED 3/22 for heavy bleeding and pt states she had a miscarriage but on review, pregnancy tests and GYN u/s were negative. Was told to f/u with Dr. Dalbert Garnet but never done. Pt tested positive for gonorrhea 3/22 and was treated with rocephin at [facility]. Pt had heavy bleeding and was started on sprintec 3/22. Pt states menses were monthly on pills, lasting 5-6 days, heavier flow with LARGE clots, and worsening dysmen, improved with ibup. Pt went to ED 7/22 for heavier bleeding and was given sprintec taper as well as iron infusion due to decreasing HgB from 7.5 to 6.9. Not taking Fe supp. Pt then had 8/22 menses that started on time but she bled daily for a few wks, stopping about 1-2 wks ago; heavy flow and large clots, mod dysmen improved with NSAIDs. Stopped sprintec about 1-2 wks ago as well due to flow and clots. No late/missed pills.   Pt would like to conceive.  Hx of IDA, still hasn't seen hematology. Taking Fe supp currently. Has transportation issues and couldn't make hematology appt sched a few wks ago.   Meds ordered this  encounter  Medications   norethindrone (AYGESTIN) 5 MG tablet    Sig: Take 1 tablet (5 mg total) by mouth daily for 7 days.    Dispense:  7 tablet    Refill:  0    Order Specific Question:   Supervising Provider    Answer:   Nadara Mustard [[id number]]

## 2021-08-25 ENCOUNTER — Ambulatory Visit: Payer: Medicaid Other | Admitting: Obstetrics and Gynecology

## 2021-09-15 NOTE — Progress Notes (Deleted)
° ° °  Myrtice Lauth   No chief complaint on file.   HPI:      [name] is a 26 y.o. G1P0010 whose LMP was No LMP recorded., presents today for ***    Patient Active Problem List   Diagnosis Date Noted   Iron deficiency anemia due to chronic blood loss 05/04/2021   Menometrorrhagia 05/04/2021    Past Surgical History:  Procedure Laterality Date   denies      No family history on file.  Social History   Socioeconomic History   Marital status: Single    Spouse name: Not on file   Number of children: Not on file   Years of education: Not on file   Highest education level: Not on file  Occupational History   Not on file  Tobacco Use   Smoking status: Never   Smokeless tobacco: Never  Vaping Use   Vaping Use: Never used  Substance and Sexual Activity   Alcohol use: No   Drug use: No   Sexual activity: Yes    Birth control/protection: None, Condom  Other Topics Concern   Not on file  Social History Narrative   Not on file   Social Determinants of Health   Financial Resource Strain: Not on file  Food Insecurity: Not on file  Transportation Needs: Not on file  Physical Activity: Not on file  Stress: Not on file  Social Connections: Not on file  Intimate Partner Violence: Not At Risk   Fear of Current or Ex-Partner: No   Emotionally Abused: No   Physically Abused: No   Sexually Abused: No    Outpatient Medications Prior to Visit  Medication Sig Dispense Refill   ARIPiprazole (ABILIFY) 15 MG tablet Take 1 tablet by mouth daily.  2   buPROPion (WELLBUTRIN XL) 300 MG 24 hr tablet Take 1 tablet by mouth daily.  0   busPIRone (BUSPAR) 10 MG tablet Take 1 tablet by mouth 2 (two) times daily.  1   esomeprazole (NEXIUM) 40 MG capsule Take 1 capsule (40 mg total) by mouth daily. 30 capsule 1   lamoTRIgine (LAMICTAL) 200 MG tablet Take 1 tablet by mouth daily.  1   naproxen (NAPROSYN) 500 MG tablet Take 1 tablet (500 mg total)  by mouth 2 (two) times daily. 30 tablet 0   norethindrone (AYGESTIN) 5 MG tablet Take 1 tablet (5 mg total) by mouth daily for 7 days. 7 tablet 0   ranitidine (ZANTAC) 150 MG capsule Take 1 capsule (150 mg total) by mouth 2 (two) times daily. 60 capsule 0   VYVANSE 50 MG capsule Take 1 capsule by mouth daily.  0   No facility-administered medications prior to visit.      ROS:  Review of Systems BREAST: No symptoms   OBJECTIVE:   Vitals:  There were no vitals taken for this visit.  Physical Exam  Results: No results found for this or any previous visit (from the past 24 hour(s)).   Assessment/Plan: No diagnosis found.    No orders of the defined types were placed in this encounter.     No follow-ups on file.  Barett Whidbee B. Julena Barbour, PA-C 09/15/2021 5:05 PM

## 2021-09-16 ENCOUNTER — Ambulatory Visit: Payer: Medicaid Other | Admitting: Obstetrics and Gynecology

## 2021-09-21 ENCOUNTER — Ambulatory Visit: Payer: Medicaid Other | Admitting: Obstetrics and Gynecology

## 2021-10-06 ENCOUNTER — Observation Stay
Admission: EM | Admit: 2021-10-06 | Discharge: 2021-10-07 | Disposition: A | Discharge status: Home / Self Care | Payer: Medicaid Other | Attending: Internal Medicine | Admitting: Internal Medicine

## 2021-10-06 ENCOUNTER — Emergency Department: Payer: Medicaid Other

## 2021-10-06 ENCOUNTER — Other Ambulatory Visit: Payer: Self-pay

## 2021-10-06 ENCOUNTER — Encounter: Payer: Self-pay | Admitting: Internal Medicine

## 2021-10-06 DIAGNOSIS — N921 Excessive and frequent menstruation with irregular cycle: Secondary | ICD-10-CM | POA: Diagnosis present

## 2021-10-06 DIAGNOSIS — N83202 Unspecified ovarian cyst, left side: Secondary | ICD-10-CM | POA: Insufficient documentation

## 2021-10-06 DIAGNOSIS — Z20822 Contact with and (suspected) exposure to covid-19: Secondary | ICD-10-CM | POA: Insufficient documentation

## 2021-10-06 DIAGNOSIS — D649 Anemia, unspecified: Secondary | ICD-10-CM

## 2021-10-06 DIAGNOSIS — N9489 Other specified conditions associated with female genital organs and menstrual cycle: Secondary | ICD-10-CM

## 2021-10-06 DIAGNOSIS — R531 Weakness: Secondary | ICD-10-CM

## 2021-10-06 DIAGNOSIS — D509 Iron deficiency anemia, unspecified: Principal | ICD-10-CM | POA: Insufficient documentation

## 2021-10-06 DIAGNOSIS — D500 Iron deficiency anemia secondary to blood loss (chronic): Secondary | ICD-10-CM | POA: Diagnosis present

## 2021-10-06 HISTORY — DX: Weakness: R53.1

## 2021-10-06 LAB — IRON AND TIBC
Iron: 18 ug/dL — ABNORMAL LOW (ref 28–170)
Iron: 32 ug/dL (ref 28–170)
Saturation Ratios: 3 % — ABNORMAL LOW (ref 10.4–31.8)
Saturation Ratios: 6 % — ABNORMAL LOW (ref 10.4–31.8)
TIBC: 529 ug/dL — ABNORMAL HIGH (ref 250–450)
TIBC: 526 ug/dL — ABNORMAL HIGH (ref 250–450)
UIBC: 511 ug/dL
UIBC: 494 ug/dL

## 2021-10-06 LAB — COMPREHENSIVE METABOLIC PANEL
ALT: 12 U/L (ref 0–44)
AST: 17 U/L (ref 15–41)
Albumin: 3.7 g/dL (ref 3.5–5.0)
Alkaline Phosphatase: 74 U/L (ref 38–126)
Anion gap: 5 (ref 5–15)
BUN: 13 mg/dL (ref 6–20)
CO2: 25 mmol/L (ref 22–32)
Calcium: 9 mg/dL (ref 8.9–10.3)
Chloride: 103 mmol/L (ref 98–111)
Creatinine, Ser: 0.78 mg/dL (ref 0.44–1.00)
GFR, Estimated: >60 mL/min (ref >60)
Glucose, Bld: 95 mg/dL (ref 70–99)
Potassium: 3.9 mmol/L (ref 3.5–5.1)
Sodium: 133 mmol/L — ABNORMAL LOW (ref 135–145)
Total Bilirubin: 0.3 mg/dL (ref 0.3–1.2)
Total Protein: 8.6 g/dL — ABNORMAL HIGH (ref 6.5–8.1)

## 2021-10-06 LAB — FERRITIN: Ferritin: 4 ng/mL — ABNORMAL LOW (ref 11–307)

## 2021-10-06 LAB — CBC WITH DIFFERENTIAL/PLATELET
Abs Immature Granulocytes: 0.05 10*3/uL (ref 0.00–0.07)
Basophils Absolute: 0 10*3/uL (ref 0.0–0.1)
Basophils Relative: 0 %
Eosinophils Absolute: 0 10*3/uL (ref 0.0–0.5)
Eosinophils Relative: 0 %
HCT: 21.4 % — ABNORMAL LOW (ref 36.0–46.0)
Hemoglobin: 5.3 g/dL — ABNORMAL LOW (ref 12.0–15.0)
Immature Granulocytes: 0 %
Lymphocytes Relative: 12 %
Lymphs Abs: 1.4 10*3/uL (ref 0.7–4.0)
MCH: 16.8 pg — ABNORMAL LOW (ref 26.0–34.0)
MCHC: 24.8 g/dL — ABNORMAL LOW (ref 30.0–36.0)
MCV: 67.9 fL — ABNORMAL LOW (ref 80.0–100.0)
Monocytes Absolute: 0.8 10*3/uL (ref 0.1–1.0)
Monocytes Relative: 7 %
Neutro Abs: 9.2 10*3/uL — ABNORMAL HIGH (ref 1.7–7.7)
Neutrophils Relative %: 81 %
Platelets: 558 10*3/uL — ABNORMAL HIGH (ref 150–400)
RBC: 3.15 MIL/uL — ABNORMAL LOW (ref 3.87–5.11)
RDW: 23.2 % — ABNORMAL HIGH (ref 11.5–15.5)
WBC: 11.5 10*3/uL — ABNORMAL HIGH (ref 4.0–10.5)
nRBC: 0.4 % — ABNORMAL HIGH (ref 0.0–0.2)

## 2021-10-06 LAB — RETICULOCYTES
Immature Retic Fract: 11.1 % (ref 2.3–15.9)
Immature Retic Fract: 7.9 % (ref 2.3–15.9)
RBC.: 3.16 MIL/uL — ABNORMAL LOW (ref 3.87–5.11)
RBC.: 3.43 MIL/uL — ABNORMAL LOW (ref 3.87–5.11)
Retic Count, Absolute: 48.7 10*3/uL (ref 19.0–186.0)
Retic Count, Absolute: 22 10*3/uL (ref 19.0–186.0)
Retic Ct Pct: 1.5 % (ref 0.4–3.1)
Retic Ct Pct: 0.6 % (ref 0.4–3.1)

## 2021-10-06 LAB — RESP PANEL BY RT-PCR (FLU A&B, COVID) ARPGX2
Influenza A by PCR: NEGATIVE
Influenza B by PCR: NEGATIVE
SARS Coronavirus 2 by RT PCR: NEGATIVE

## 2021-10-06 LAB — FOLATE: Folate: 13.6 ng/mL (ref >5.9)

## 2021-10-06 LAB — URINALYSIS, ROUTINE W REFLEX MICROSCOPIC
Bilirubin Urine: NEGATIVE
Glucose, UA: NEGATIVE mg/dL
Hgb urine dipstick: NEGATIVE
Ketones, ur: NEGATIVE mg/dL
Nitrite: NEGATIVE
Protein, ur: NEGATIVE mg/dL
Specific Gravity, Urine: 1.025 (ref 1.005–1.030)
pH: 5 (ref 5.0–8.0)

## 2021-10-06 LAB — URINALYSIS, MICROSCOPIC (REFLEX): Bacteria, UA: NONE SEEN

## 2021-10-06 LAB — POC URINE PREG, ED: Preg Test, Ur: NEGATIVE

## 2021-10-06 LAB — T4, FREE: Free T4: 0.92 ng/dL (ref 0.61–1.12)

## 2021-10-06 LAB — TSH
TSH: 0.415 u[IU]/mL (ref 0.350–4.500)
TSH: 0.248 u[IU]/mL — ABNORMAL LOW (ref 0.350–4.500)

## 2021-10-06 LAB — PREPARE RBC (CROSSMATCH)

## 2021-10-06 LAB — LIPASE, BLOOD: Lipase: 24 U/L (ref 11–51)

## 2021-10-06 MED ORDER — PANTOPRAZOLE SODIUM 40 MG PO TBEC
40.0000 mg | DELAYED_RELEASE_TABLET | Freq: Every day | ORAL | Status: DC
  Administered 2021-10-06 – 2021-10-07 (×2): 40 mg via ORAL
  Filled 2021-10-06 (×2): qty 1

## 2021-10-06 MED ORDER — BUSPIRONE HCL 10 MG PO TABS
10.0000 mg | ORAL_TABLET | Freq: Two times a day (BID) | ORAL | Status: DC
  Administered 2021-10-06 – 2021-10-07 (×2): 10 mg via ORAL
  Filled 2021-10-06 (×3): qty 1

## 2021-10-06 MED ORDER — FOSFOMYCIN TROMETHAMINE 3 G PO PACK
3.0000 g | PACK | Freq: Once | ORAL | Status: AC
  Administered 2021-10-06: 19:00:00 3 g via ORAL
  Filled 2021-10-06: qty 3

## 2021-10-06 MED ORDER — IOHEXOL 350 MG/ML SOLN
100.0000 mL | Freq: Once | INTRAVENOUS | Status: AC | PRN
  Administered 2021-10-06: 16:00:00 100 mL via INTRAVENOUS
  Filled 2021-10-06: qty 100

## 2021-10-06 MED ORDER — ACETAMINOPHEN 325 MG PO TABS
650.0000 mg | ORAL_TABLET | Freq: Four times a day (QID) | ORAL | Status: DC | PRN
  Administered 2021-10-06: 23:00:00 650 mg via ORAL
  Filled 2021-10-06: qty 2

## 2021-10-06 MED ORDER — ARIPIPRAZOLE 15 MG PO TABS
15.0000 mg | ORAL_TABLET | Freq: Every day | ORAL | Status: DC
  Administered 2021-10-07: 08:00:00 15 mg via ORAL
  Filled 2021-10-06: qty 1

## 2021-10-06 MED ORDER — SODIUM CHLORIDE 0.9 % IV SOLN
10.0000 mL/h | Freq: Once | INTRAVENOUS | Status: AC
  Administered 2021-10-06: 16:00:00 10 mL/h via INTRAVENOUS

## 2021-10-06 MED ORDER — ACETAMINOPHEN 500 MG PO TABS
1000.0000 mg | ORAL_TABLET | Freq: Once | ORAL | Status: AC
  Administered 2021-10-06: 14:00:00 1000 mg via ORAL
  Filled 2021-10-06: qty 2

## 2021-10-06 NOTE — H&P (Addendum)
History and Physical    Julie Mcclain ONG:[id number] DOB: 1996-04-15 DOA: 10/06/2021  PCP: Grace Isaac    Patient coming from:  Home    Chief Complaint:  Generalized weakness.   HPI:  Julie Mcclain is a 26 y.o. female seen in ed with complaints of generalized weakness since about a few weeks. Loss of energy. Pt has pcp and ob/gyn. Pt has never gone to hematologist. Pt has had heavy periods. Pt takes 3 meds for her ADHD and bipolar disorder.  Pt did not report any abdominal pain to me but did to edmd and scan showed ovarian cyst.   Pt has past medical history of ADHD, obesity, bipolar disorder.   ED Course:   Vitals:   10/06/21 1730 10/06/21 1732 10/06/21 1900 10/06/21 2031  BP: (!) 113/48 (!) 113/48 (!) 128/92 (!) 118/58  Pulse: (!) 121 (!) 117 (!) 117 99  Resp:  20 20 20   Temp:  99.3 F (37.4 C) 98.5 F (36.9 C) 98.6 F (37 C)  TempSrc:  Oral Oral Oral  SpO2: 100% 100% 99%   Weight:      Height:       In ed pt is alert and gives history but is poor historian.  Labs shows  Review of Systems:  Review of Systems  Constitutional:  Positive for malaise/fatigue.  Neurological:  Positive for headaches.  All other systems reviewed and are negative. Past Medical History:  Diagnosis Date   Menometrorrhagia    Obesity     Past Surgical History:  Procedure Laterality Date   denies       reports that she has never smoked. She has never used smokeless tobacco. She reports that she does not drink alcohol and does not use drugs.  No Known Allergies  History reviewed. No pertinent family history.  Prior to Admission medications   Medication Sig Start Date End Date Taking? Authorizing Provider  ARIPiprazole (ABILIFY) 15 MG tablet Take 1 tablet by mouth daily. 03/22/17   [provider]  buPROPion (WELLBUTRIN XL) 300 MG 24 hr tablet Take 1 tablet by mouth daily. 03/22/17   [provider]  busPIRone (BUSPAR) 10 MG tablet Take 1 tablet by mouth 2  (two) times daily. 03/22/17   [provider]  esomeprazole (NEXIUM) 40 MG capsule Take 1 capsule (40 mg total) by mouth daily. 05/08/17 05/08/18  05/10/18, MD  lamoTRIgine (LAMICTAL) 200 MG tablet Take 1 tablet by mouth daily. 03/22/17   [provider]  naproxen (NAPROSYN) 500 MG tablet Take 1 tablet (500 mg total) by mouth 2 (two) times daily. 09/20/19   11/18/19, PA-C  norethindrone (AYGESTIN) 5 MG tablet Take 1 tablet (5 mg total) by mouth daily for 7 days. 08/24/21 08/31/21  Copland, 09/02/21, PA-C  ranitidine (ZANTAC) 150 MG capsule Take 1 capsule (150 mg total) by mouth 2 (two) times daily. 01/02/18 02/01/18  02/03/18, MD  VYVANSE 50 MG capsule Take 1 capsule by mouth daily. 03/22/17   [provider]    Physical Exam: Vitals:   10/06/21 1730 10/06/21 1732 10/06/21 1900 10/06/21 2031  BP: (!) 113/48 (!) 113/48 (!) 128/92 (!) 118/58  Pulse: (!) 121 (!) 117 (!) 117 99  Resp:  20 20 20   Temp:  99.3 F (37.4 C) 98.5 F (36.9 C) 98.6 F (37 C)  TempSrc:  Oral Oral Oral  SpO2: 100% 100% 99%   Weight:      Height:  Physical Exam Vitals reviewed.  Constitutional:      General: She is not in acute distress.    Appearance: She is obese. She is not ill-appearing.  HENT:     Head: Normocephalic and atraumatic.     Right Ear: External ear normal.     Left Ear: External ear normal.     Nose: Nose normal.     Mouth/Throat:     Mouth: Mucous membranes are moist.  Eyes:     Extraocular Movements: Extraocular movements intact.     Pupils: Pupils are equal, round, and reactive to light.  Cardiovascular:     Rate and Rhythm: Normal rate and regular rhythm.     Pulses: Normal pulses.     Heart sounds: Normal heart sounds.  Pulmonary:     Effort: Pulmonary effort is normal.     Breath sounds: Normal breath sounds.  Abdominal:     General: Bowel sounds are normal. There is no distension.     Palpations: Abdomen is soft. There is no mass.      Tenderness: There is no abdominal tenderness. There is no guarding.     Hernia: No hernia is present.  Musculoskeletal:     Right lower leg: No edema.     Left lower leg: No edema.  Skin:    General: Skin is warm.  Neurological:     General: No focal deficit present.     Mental Status: She is alert and oriented to person, place, and time.  Psychiatric:        Mood and Affect: Mood normal.        Behavior: Behavior normal.     Labs on Admission: I have personally reviewed following labs and imaging studies No results for input(s): CKTOTAL, CKMB, TROPONINI in the last 72 hours. Lab Results  Component Value Date   WBC 11.5 (H) 10/06/2021   HGB 5.3 (L) 10/06/2021   HCT 21.4 (L) 10/06/2021   MCV 67.9 (L) 10/06/2021   PLT 558 (H) 10/06/2021    Recent Labs  Lab 10/06/21 1348  NA 133*  K 3.9  CL 103  CO2 25  BUN 13  CREATININE 0.78  CALCIUM 9.0  PROT 8.6*  BILITOT 0.3  ALKPHOS 74  ALT 12  AST 17  GLUCOSE 95   No results found for: CHOL, HDL, LDLCALC, TRIG No results found for: DDIMER Invalid input(s): POCBNP   COVID-19 Labs No results for input(s): DDIMER, FERRITIN, LDH, CRP in the last 72 hours. Lab Results  Component Value Date   SARSCOV2NAA NEGATIVE 10/06/2021   SARSCOV2NAA NEGATIVE 11/28/2020    Radiological Exams on Admission: CT ABDOMEN PELVIS W CONTRAST  Result Date: 10/06/2021 CLINICAL DATA:  Pain right lower quadrant EXAM: CT ABDOMEN AND PELVIS WITH CONTRAST TECHNIQUE: Multidetector CT imaging of the abdomen and pelvis was performed using the standard protocol following bolus administration of intravenous contrast. RADIATION DOSE REDUCTION: This exam was performed according to the departmental dose-optimization program which includes automated exposure control, adjustment of the mA and/or kV according to patient size and/or use of iterative reconstruction technique. CONTRAST:  OMNIPAQUE IOHEXOL 350 MG/ML SOLN COMPARISON:  05/08/2017 FINDINGS:  Lower chest: Unremarkable. Hepatobiliary: Liver measures 16.9 cm in length. No focal abnormality is seen. Gallbladder is not distended. Pancreas: No focal abnormality is seen. Spleen: Unremarkable. Adrenals/Urinary Tract: Adrenals are not enlarged. There is no hydronephrosis. There are no renal or ureteral stones. Urinary bladder is not distended. Stomach/Bowel: Stomach is unremarkable. Small bowel loops  are not dilated. Appendix is not dilated. There is no significant wall thickening in colon. There is no pericolic stranding. Vascular/Lymphatic: Vascular structures are unremarkable. There are subcentimeter nodes in the mesentery Reproductive: There is 4.9 cm smooth marginated fluid density lesion in the left adnexa. Other: There is no ascites or pneumoperitoneum. Musculoskeletal: Degenerative changes are noted with encroachment of neural foramina at L5-S1 level, more so on the left side. IMPRESSION: There is no evidence of intestinal obstruction or pneumoperitoneum. Appendix is not dilated. There is no hydronephrosis. There is 4.9 cm smooth marginated fluid density lesion in the left adnexa. This may suggest functional ovarian cysts. Less likely possibility would be cystic neoplasm. Follow-up sonogram in 1-2 months should be considered to assess resolution. Lumbar spondylosis with encroachment of neural foramina at L5-S1 level, more so on the left side. Electronically Signed   By: Ernie AvenaPalani  Rathinasamy M.D.   On: 10/06/2021 16:10   US PELVIC COMPLETE W TRANSVAGINAL AND TORSION R/O  Result Date: 10/06/2021 CLINICAL DATA:  Adnexal mass. Patient reports pelvic pain for 1 week. EXAM: TRANSABDOMINAL AND TRANSVAGINAL ULTRASOUND OF PELVIS DOPPLER ULTRASOUND OF OVARIES TECHNIQUE: Both transabdominal and transvaginal ultrasound examinations of the pelvis were performed. Transabdominal technique was performed for global imaging of the pelvis including uterus, ovaries, adnexal regions, and pelvic cul-de-sac. It was  necessary to proceed with endovaginal exam following the transabdominal exam to visualize the ovaries and adnexa. Color and duplex Doppler ultrasound was utilized to evaluate blood flow to the ovaries. COMPARISON:  Abdominopelvic CT earlier today. Pelvic ultrasound 03/28/2021 FINDINGS: Uterus Measurements: 7.5 x 3.9 x 5.2 cm = volume: 80 mL. The uterus is anteverted. No fibroids or other mass visualized. Nabothian cysts in the cervix are considered incidental. Endometrium Thickness: 13 mm, mildly heterogeneous. No fluid in the endometrial canal or focal abnormality visualized. Right ovary Measurements: 2.9 x 1.4 x 2.4 cm = volume: 5.2 mL. Normal quiescent appearance with physiologic follicles. No dominant cyst or solid lesion. No adnexal mass. Normal ovarian blood flow. Left ovary Measurements: 5.1 x 3.4 x 4.4 cm = volume: 40 mL. Cyst measures 3.6 x 3.5 x 3.5 cm. No definite septations or internal complexity. Adjacent physiologic follicles. Normal ovarian blood flow. No adnexal mass. Pulsed Doppler evaluation of both ovaries demonstrates normal low-resistance arterial and venous waveforms. Other findings No abnormal free fluid. IMPRESSION: 1. Left ovarian cyst measures 3.6 cm greatest dimension. This is new from pelvic ultrasound in July. Cyst measuring less than 5 cm need no further characterization or follow-up. Normal ovarian blood flow. 2. Normal sonographic appearance of the right ovary and uterus. Electronically Signed   By: Narda RutherfordMelanie  Sanford M.D.   On: 10/06/2021 18:53    EKG: Independently reviewed.  None    Assessment/Plan: Principal Problem:   Generalized weakness Active Problems:   Iron deficiency anemia due to chronic blood loss   Menometrorrhagia   General weakness:  Attribute to her anemia. We will get ft4 and tsh.  Transfuse.   IDA: Pt receiving her PRBC. Anemia panel.   Menometrorrhagia: Pt is taking her OCP and needs to f/u with her OB/GYN.   DVT prophylaxis:  SCD's    Code Status:  Full Code     Family Communication:  Bonnye FavaFarrington,Pamela (Mother)  [phone] (Mobile)   Disposition Plan:  Home    Consults called:  None   Admission status: Observation.    Medical Decision Making   Coding    Gertha CalkinEkta V Advait Buice MD [facility]  6 PM- 2 AM.  Please contact me via secure Chat 6 PM-2 AM. To contact the [facility] Attending or Consulting provider 7A - 7P or covering provider during after hours 7P -7A, for this patient.   Check the care team in [facility] and look for a) attending/consulting [facility] provider listed and b) the [facility] team listed Log into [email or web address] and use [facility] [place]'s universal password to access. If you do not have the password, please contact the hospital operator. Locate the [facility] provider you are looking for under [facility] and page to a number that you can be directly reached. If you still have difficulty reaching the provider, please page the [facility] (Director on Call) for the Hospitalists listed on amion for assistance. [email or web address] 10/06/2021, 8:39 PM

## 2021-10-06 NOTE — ED Notes (Signed)
Patient transported to CT 

## 2021-10-06 NOTE — ED Provider Notes (Signed)
[facility] Provider Note    Event Date/Time   First MD Initiated Contact with Patient 10/06/21 1503     (approximate)   History   Possible Pregnancy   HPI  Julie Mcclain is a 26 y.o. female with a past medical history of menometrorrhagia and bipolar disorder who presents for assessment of several weeks of progressively worsening fatigue, intermittent but near daily headaches and some achiness in the right side of her abdomen.  She is not sure when her abdominal pain began.  She thinks it is been ongoing for couple weeks.  She has not had any vision changes, vertigo, head injuries or falls, neck pain, back pain, chest pain, cough, shortness of breath, left abdominal pain, vaginal bleeding, discharge, burning with urination, melanotic stools, diarrhea, constipation rash or extremity pain.  She states she has tried some Tylenol but does not help much.  She denies NSAID use including naproxen which is listed in her medications.  She denies EtOH use or illicit drug use.  She states she does not know she is anemic or has a history of iron deficiency anemia which I can see in review of her notes from OB visit in November where she was diagnosed with iron deficiency anemia.  She is not currently on any iron supplementation.       Physical Exam  Triage Vital Signs: ED Triage Vitals  Enc Vitals Group     BP 10/06/21 1343 134/74     Pulse Rate 10/06/21 1343 (!) 118     Resp 10/06/21 1343 20     Temp 10/06/21 1343 98.7 F (37.1 C)     Temp Source 10/06/21 1343 Oral     SpO2 10/06/21 1343 100 %     Weight 10/06/21 1344 250 lb (113.4 kg)     Height 10/06/21 1344 5\' 3"  (1.6 m)     Head Circumference --      Peak Flow --      Pain Score 10/06/21 1343 8     Pain Loc --      Pain Edu? --      Excl. in [place]? --     Most recent vital signs: Vitals:   10/06/21 1732 10/06/21 1900  BP: (!) 113/48 (!) 128/92  Pulse: (!) 117 (!) 117  Resp: 20 20  Temp: 99.3 F (37.4  C) 98.5 F (36.9 C)  SpO2: 100% 99%    General: Awake, no distress.  CV:  Good peripheral perfusion.  Slightly tachycardic. Resp:  Normal effort.  Clear bilaterally. Abd:  No distention.  Very slightly tender in the right side of the abdomen more in the right lower quadrant and upper quadrant. Other:  Cranial nerves II through grossly intact.  Patient is moving all extremities symmetrically.   ED Results / Procedures / Treatments  Labs (all labs ordered are listed, but only abnormal results are displayed) Labs Reviewed  COMPREHENSIVE METABOLIC PANEL - Abnormal; Notable for the following components:      Result Value   Sodium 133 (*)    Total Protein 8.6 (*)    All other components within normal limits  CBC WITH DIFFERENTIAL/PLATELET - Abnormal; Notable for the following components:   WBC 11.5 (*)    RBC 3.15 (*)    Hemoglobin 5.3 (*)    HCT 21.4 (*)    MCV 67.9 (*)    MCH 16.8 (*)    MCHC 24.8 (*)    RDW 23.2 (*)  Platelets 558 (*)    nRBC 0.4 (*)    Neutro Abs 9.2 (*)    All other components within normal limits  URINALYSIS, ROUTINE W REFLEX MICROSCOPIC - Abnormal; Notable for the following components:   Leukocytes,Ua SMALL (*)    All other components within normal limits  RETICULOCYTES - Abnormal; Notable for the following components:   RBC. 3.16 (*)    All other components within normal limits  IRON AND TIBC - Abnormal; Notable for the following components:   Iron 18 (*)    TIBC 529 (*)    Saturation Ratios 3 (*)    All other components within normal limits  RESP PANEL BY RT-PCR (FLU A&B, COVID) ARPGX2  URINE CULTURE  LIPASE, BLOOD  URINALYSIS, MICROSCOPIC (REFLEX)  TSH  VITAMIN B12  POC URINE PREG, ED  TYPE AND SCREEN  PREPARE RBC (CROSSMATCH)     EKG    RADIOLOGY  CT abdomen pelvis ordered and reviewed by myself shows no evidence of appendicitis, kidney stone, hydronephrosis, diverticulitis or other clear acute process although there is a sizable  cyst in the left adnexal area and some spondylolisthesis of the lumbar spine.  No free fluid or evidence of hemorrhage.  I also reviewed radiology interpretation and agree with their findings.  Pelvic ultrasound shows persistent left-sided cyst 3.6 cm in diameter without any hemorrhage.  There is normal blood flow with no evidence of torsion.  Uterus and right ovary are unremarkable.   PROCEDURES:  Critical Care performed: No  .Critical Care Performed by: Lucrezia Starch, MD Authorized by: Lucrezia Starch, MD   Critical care provider statement:    Critical care time (minutes):  30   Critical care was necessary to treat or prevent imminent or life-threatening deterioration of the following conditions:  Circulatory failure   Critical care was time spent personally by me on the following activities:  Development of treatment plan with patient or surrogate, discussions with consultants, evaluation of patient's response to treatment, examination of patient, ordering and review of laboratory studies, ordering and review of radiographic studies, ordering and performing treatments and interventions, pulse oximetry, re-evaluation of patient's condition and review of old charts   MEDICATIONS ORDERED IN ED: Medications  acetaminophen (TYLENOL) tablet 1,000 mg (1,000 mg Oral Given 10/06/21 1352)  0.9 %  sodium chloride infusion (10 mL/hr Intravenous New Bag/Given 10/06/21 1600)  iohexol (OMNIPAQUE) 350 MG/ML injection 100 mL (100 mLs Intravenous Contrast Given 10/06/21 1548)  fosfomycin (MONUROL) packet 3 g (3 g Oral Given 10/06/21 1900)     IMPRESSION / MDM / ASSESSMENT AND PLAN / ED COURSE  I reviewed the triage vital signs and the nursing notes.                              Differential diagnosis includes, but is not limited to, symptoms related to worsening anemia from chronic iron deficiency anemia, other metabolic derangements, thyroid dysfunction and with regard to the right lower quadrant  possibly symptomatic ovarian cyst, appendicitis, diverticulitis with overall time course less consistent with torsion.   CMP shows a sodium of 133 without any other significant electrolyte or metabolic derangements.  No evidence of hepatitis or cholestasis.  Lipase not consistent with acute pancreatitis.  CBC shows slight leukocytosis with WBC count of 11.5.  Patient's hemoglobin today is 5.3.  Appears 2 months ago 6.7 with I do not see any interim hemoglobins.  Platelets today are 558.  UA has small leukocyte esterase and 11-20 WBCs.  Lower suspicion for clinically significant cystitis I do not believe patient is septic although we will treat with a one-time dose of fosfomycin and obtain a urine culture.  We will also send reticulocyte count and iron panel.  COVID influenza PCR is negative.  TSH is within normal limits.  CT abdomen pelvis ordered and reviewed by myself shows no evidence of appendicitis, kidney stone, hydronephrosis, diverticulitis or other clear acute process although there is a sizable cyst in the left adnexal area and some spondylolisthesis of the lumbar spine.  No free fluid or evidence of hemorrhage.  I also reviewed radiology interpretation and agree with their findings.  Pelvic ultrasound shows persistent left-sided cyst 3.6 cm in diameter without any hemorrhage.  There is normal blood flow with no evidence of torsion.  Uterus and right ovary are unremarkable.  I suspect likely chronic worsening of patient's chronic underlying iron deficiency anemia which seems to have not been treated over the last couple months.  We will start 2 units of PRBCs and admit to medicine service.      FINAL CLINICAL IMPRESSION(S) / ED DIAGNOSES   Final diagnoses:  Adnexal mass  Low hemoglobin     Rx / DC Orders   ED Discharge Orders     None        Note:  This document was prepared using Dragon voice recognition software and may include unintentional dictation errors.   Lucrezia Starch, MD 10/06/21 [place] Drop

## 2021-10-06 NOTE — ED Notes (Addendum)
Pt to u/s with rn and u/s tech

## 2021-10-06 NOTE — ED Notes (Signed)
Iv started and meds given.   

## 2021-10-06 NOTE — ED Triage Notes (Signed)
Pt here after not having her period for a while. Pt last menstrual was 08/13/2021. Pt having abd pain, N/v/d. Pt crying in triage "stating that her body is shutting down and she can't handle a pregnancy right now".

## 2021-10-06 NOTE — ED Notes (Signed)
Prbc's infusing.  Pt alert.  Pt eating crackers and ale.

## 2021-10-06 NOTE — ED Notes (Signed)
Report off to tom rn 

## 2021-10-06 NOTE — ED Notes (Signed)
Consent signed by pt for blood transfusion.  Iv fluids infusing.

## 2021-10-06 NOTE — ED Notes (Signed)
Return from u/s  pt eating dinner meal

## 2021-10-06 NOTE — ED Notes (Signed)
1st unit prbc's infusing now.  Pt eating crackers.

## 2021-10-07 DIAGNOSIS — R531 Weakness: Secondary | ICD-10-CM | POA: Diagnosis not present

## 2021-10-07 LAB — CBC
HCT: 27.7 % — ABNORMAL LOW (ref 36.0–46.0)
Hemoglobin: 7.9 g/dL — ABNORMAL LOW (ref 12.0–15.0)
MCH: 20.7 pg — ABNORMAL LOW (ref 26.0–34.0)
MCHC: 28.5 g/dL — ABNORMAL LOW (ref 30.0–36.0)
MCV: 72.7 fL — ABNORMAL LOW (ref 80.0–100.0)
Platelets: 440 10*3/uL — ABNORMAL HIGH (ref 150–400)
RBC: 3.81 MIL/uL — ABNORMAL LOW (ref 3.87–5.11)
RDW: 26 % — ABNORMAL HIGH (ref 11.5–15.5)
WBC: 12.7 10*3/uL — ABNORMAL HIGH (ref 4.0–10.5)
nRBC: 0.3 % — ABNORMAL HIGH (ref 0.0–0.2)

## 2021-10-07 LAB — COMPREHENSIVE METABOLIC PANEL
ALT: 13 U/L (ref 0–44)
AST: 18 U/L (ref 15–41)
Albumin: 3.4 g/dL — ABNORMAL LOW (ref 3.5–5.0)
Alkaline Phosphatase: 71 U/L (ref 38–126)
Anion gap: 8 (ref 5–15)
BUN: 11 mg/dL (ref 6–20)
CO2: 24 mmol/L (ref 22–32)
Calcium: 9.1 mg/dL (ref 8.9–10.3)
Chloride: 104 mmol/L (ref 98–111)
Creatinine, Ser: 0.62 mg/dL (ref 0.44–1.00)
GFR, Estimated: >60 mL/min (ref >60)
Glucose, Bld: 95 mg/dL (ref 70–99)
Potassium: 3.7 mmol/L (ref 3.5–5.1)
Sodium: 136 mmol/L (ref 135–145)
Total Bilirubin: 0.7 mg/dL (ref 0.3–1.2)
Total Protein: 7.9 g/dL (ref 6.5–8.1)

## 2021-10-07 LAB — BPAM RBC
Blood Product Expiration Date: 202303012359
Blood Product Expiration Date: 202303012359
ISSUE DATE / TIME: 202301241656
ISSUE DATE / TIME: 202301242255
Unit Type and Rh: 5100
Unit Type and Rh: 5100

## 2021-10-07 LAB — TYPE AND SCREEN
ABO/RH(D): O POS
Antibody Screen: NEGATIVE
Unit division: 0
Unit division: 0

## 2021-10-07 LAB — VITAMIN B12: Vitamin B-12: 370 pg/mL (ref 180–914)

## 2021-10-07 LAB — HIV ANTIBODY (ROUTINE TESTING W REFLEX): HIV Screen 4th Generation wRfx: NONREACTIVE

## 2021-10-07 MED ORDER — ESOMEPRAZOLE MAGNESIUM 40 MG PO CPDR: 40.0000 mg | DELAYED_RELEASE_CAPSULE | Freq: Every day | ORAL | 0 refills | Status: DC

## 2021-10-07 MED ORDER — FERROUS SULFATE 325 (65 FE) MG PO TABS: 325.0000 mg | ORAL_TABLET | Freq: Three times a day (TID) | ORAL | Status: DC

## 2021-10-07 MED ORDER — FERROUS SULFATE 325 (65 FE) MG PO TABS: 325.0000 mg | ORAL_TABLET | Freq: Two times a day (BID) | ORAL | 1 refills | Status: DC

## 2021-10-07 NOTE — Progress Notes (Signed)
Discharge paperwork given and reviewed with patient. Patient taken to discharge lounge to await transportation. Perlie Mayo, RN

## 2021-10-07 NOTE — Discharge Summary (Signed)
Physician Discharge Summary  Julie Mcclain PPJ:[id number] DOB: 05-31-1996 DOA: 10/06/2021  PCP: Grace Isaac  Admit date: 10/06/2021 Discharge date: 10/07/2021  Admitted From: home Disposition:  home  Recommendations for Outpatient Follow-up:  Follow up with PCP and OB-GYN in 1-2 weeks Please obtain BMP/CBC in one week  Home Health:no  Equipment/Devices: none  Discharge Condition: Stable Code Status:   Code Status: Full Code Diet recommendation:  Diet Order             Diet Heart Room service appropriate? Yes; Fluid consistency: Thin  Diet effective now                   Brief/Interim Summary: 26 y.o. female with known history of menometrorrhagia, bipolar disorder, iron deficiency anemia diagnosed in her last visit with OB in November presents to the ED for evaluation of generalized weakness for few weeks described as loss of energy.  She has had heavy periods sometimes lasting few days to several weeks. Patient denies fever nausea vomiting.  She had some side pain on her abdomen In the ED work-up showed severe anemia rest her labs fairly stable, UA with leukocyte esterase and 11-20 WBC, was given dose of fosfomycin, TSH stable, CT abdomen pelvis was done that showed no acute significant finding except for left-sided persistent cyst 3.6 cm that was confirmed on pelvic ultrasound. Patient was transfused 1 unit PRBC monitor overnight.  Posttransfusion symptoms improved back to baseline ambulatory no nausea vomiting abdominal pain.  Agreeable for discharge home and follow-up with PCP and OB/GYN for her iron deficiency anemia may need hematology evaluation as outpatient.  Discharge Diagnoses:   Generalized weakness Iron-deficiency anemia acute on chronic: In the setting of menometrorrhagia, diagnosed in November, followed by OB/GYN advised to follow-up with OB/GYN for ongoing management of her menstrual issues.  After transfusion symptoms improved.  Placed on iron  supplementation we will discharge her on that.  Advised CBC check in a week from PCP.  Morbid obesity BMI 50 Will benefit with weight loss healthy lifestyle and outpatient sleep evaluation  3.6 cm left-sided cyst in the ovary seen on CT and ultrasound.'s patient follow-up with OB.  Consults: none  Subjective: Feels well alert awake oriented x3.  No chest pain no weakness.  Feels ready for home today Discharge Exam: Vitals:   10/07/21 0534 10/07/21 0730  BP: 136/88 123/63  Pulse: (!) 114 (!) 121  Resp: 16 16  Temp: 98.9 F (37.2 C) 98.2 F (36.8 C)  SpO2: 99% 100%   General: Pt is alert, awake, not in acute distress Cardiovascular: RRR, S1/S2 +, no rubs, no gallops Respiratory: CTA bilaterally, no wheezing, no rhonchi Abdominal: Soft, NT, ND, bowel sounds + Extremities: no edema, no cyanosis  Discharge Instructions  Discharge Instructions     Discharge instructions   Complete by: As directed    Check CBC in 1 week Follow-up with your OB/GYN doctor in a week and discuss about you irregular periods/heavy periods and ovarian cyst  Please call call MD or return to ER for similar or worsening recurring problem that brought you to hospital or if any fever,nausea/vomiting,abdominal pain, uncontrolled pain, chest pain,  shortness of breath or any other alarming symptoms.  Please follow-up your doctor as instructed in a week time and call the office for appointment.  Please avoid alcohol, smoking, or any other illicit substance and maintain healthy habits including taking your regular medications as prescribed.  You were cared for by a hospitalist during your hospital  stay. If you have any questions about your discharge medications or the care you received while you were in the hospital after you are discharged, you can call the unit and ask to speak with the hospitalist on call if the hospitalist that took care of you is not available.  Once you are discharged, your primary care  physician will handle any further medical issues. Please note that NO REFILLS for any discharge medications will be authorized once you are discharged, as it is imperative that you return to your primary care physician (or establish a relationship with a primary care physician if you do not have one) for your aftercare needs so that they can reassess your need for medications and monitor your lab values   Increase activity slowly   Complete by: As directed       Allergies as of 10/07/2021   No Known Allergies      Medication List     STOP taking these medications    naproxen 500 MG tablet Commonly known as: NAPROSYN       TAKE these medications    ARIPiprazole 15 MG tablet Commonly known as: ABILIFY Take 1 tablet by mouth daily.   buPROPion 300 MG 24 hr tablet Commonly known as: WELLBUTRIN XL Take 1 tablet by mouth daily.   busPIRone 10 MG tablet Commonly known as: BUSPAR Take 1 tablet by mouth 2 (two) times daily.   esomeprazole 40 MG capsule Commonly known as: NexIUM Take 1 capsule (40 mg total) by mouth daily.   ferrous sulfate 325 (65 FE) MG tablet Take 1 tablet (325 mg total) by mouth 2 (two) times daily with a meal.   lamoTRIgine 200 MG tablet Commonly known as: LAMICTAL Take 1 tablet by mouth daily.   norethindrone 5 MG tablet Commonly known as: AYGESTIN Take 1 tablet (5 mg total) by mouth daily for 7 days.   ranitidine 150 MG capsule Commonly known as: ZANTAC Take 1 capsule (150 mg total) by mouth 2 (two) times daily.   Vyvanse 50 MG capsule Generic drug: lisdexamfetamine Take 1 capsule by mouth daily.        Follow-up Information     Grace IsaacJozewicz, Laura Follow up.   Specialty: General Practice Contact information: [address] [place] [id number] [phone]                No Known Allergies  The results of significant diagnostics from this hospitalization (including imaging, microbiology, ancillary and laboratory) are  listed below for reference.    Microbiology: Recent Results (from the past 240 hour(s))  Resp Panel by RT-PCR (Flu A&B, Covid) Nasopharyngeal Swab     Status: None   Collection Time: 10/06/21  3:38 PM   Specimen: Nasopharyngeal Swab; Nasopharyngeal(NP) swabs in vial transport medium  Result Value Ref Range Status   SARS Coronavirus 2 by RT PCR NEGATIVE NEGATIVE Final    Comment: (NOTE) SARS-CoV-2 target nucleic acids are NOT DETECTED.  The SARS-CoV-2 RNA is generally detectable in upper respiratory specimens during the acute phase of infection. The lowest concentration of SARS-CoV-2 viral copies this assay can detect is 138 copies/mL. A negative result does not preclude SARS-Cov-2 infection and should not be used as the sole basis for treatment or other patient management decisions. A negative result may occur with  improper specimen collection/handling, submission of specimen other than nasopharyngeal swab, presence of viral mutation(s) within the areas targeted by this assay, and inadequate number of viral copies(<138 copies/mL). A negative  result must be combined with clinical observations, patient history, and epidemiological information. The expected result is Negative.  Fact Sheet for Patients:  BloggerCourse.com  Fact Sheet for Healthcare Providers:  SeriousBroker.it  This test is no t yet approved or cleared by the [place] FDA and  has been authorized for detection and/or diagnosis of SARS-CoV-2 by FDA under an Emergency Use Authorization (EUA). This EUA will remain  in effect (meaning this test can be used) for the duration of the COVID-19 declaration under Section 564(b)(1) of the Act, 21 U.S.C.section 360bbb-3(b)(1), unless the authorization is terminated  or revoked sooner.       Influenza A by PCR NEGATIVE NEGATIVE Final   Influenza B by PCR NEGATIVE NEGATIVE Final    Comment: (NOTE) The Xpert Xpress  SARS-CoV-2/FLU/RSV plus assay is intended as an aid in the diagnosis of influenza from Nasopharyngeal swab specimens and should not be used as a sole basis for treatment. Nasal washings and aspirates are unacceptable for Xpert Xpress SARS-CoV-2/FLU/RSV testing.  Fact Sheet for Patients: BloggerCourse.com  Fact Sheet for Healthcare Providers: SeriousBroker.it  This test is not yet approved or cleared by the [place] FDA and has been authorized for detection and/or diagnosis of SARS-CoV-2 by FDA under an Emergency Use Authorization (EUA). This EUA will remain in effect (meaning this test can be used) for the duration of the COVID-19 declaration under Section 564(b)(1) of the Act, 21 U.S.C. section 360bbb-3(b)(1), unless the authorization is terminated or revoked.  Performed at [facility], [address] [place], [place] [address]     Procedures/Studies: CT ABDOMEN PELVIS W CONTRAST  Result Date: 10/06/2021 CLINICAL DATA:  Pain right lower quadrant EXAM: CT ABDOMEN AND PELVIS WITH CONTRAST TECHNIQUE: Multidetector CT imaging of the abdomen and pelvis was performed using the standard protocol following bolus administration of intravenous contrast. RADIATION DOSE REDUCTION: This exam was performed according to the departmental dose-optimization program which includes automated exposure control, adjustment of the mA and/or kV according to patient size and/or use of iterative reconstruction technique. CONTRAST:  OMNIPAQUE IOHEXOL 350 MG/ML SOLN COMPARISON:  05/08/2017 FINDINGS: Lower chest: Unremarkable. Hepatobiliary: Liver measures 16.9 cm in length. No focal abnormality is seen. Gallbladder is not distended. Pancreas: No focal abnormality is seen. Spleen: Unremarkable. Adrenals/Urinary Tract: Adrenals are not enlarged. There is no hydronephrosis. There are no renal or ureteral stones. Urinary bladder is not distended.  Stomach/Bowel: Stomach is unremarkable. Small bowel loops are not dilated. Appendix is not dilated. There is no significant wall thickening in colon. There is no pericolic stranding. Vascular/Lymphatic: Vascular structures are unremarkable. There are subcentimeter nodes in the mesentery Reproductive: There is 4.9 cm smooth marginated fluid density lesion in the left adnexa. Other: There is no ascites or pneumoperitoneum. Musculoskeletal: Degenerative changes are noted with encroachment of neural foramina at L5-S1 level, more so on the left side. IMPRESSION: There is no evidence of intestinal obstruction or pneumoperitoneum. Appendix is not dilated. There is no hydronephrosis. There is 4.9 cm smooth marginated fluid density lesion in the left adnexa. This may suggest functional ovarian cysts. Less likely possibility would be cystic neoplasm. Follow-up sonogram in 1-2 months should be considered to assess resolution. Lumbar spondylosis with encroachment of neural foramina at L5-S1 level, more so on the left side. Electronically Signed   By: Ernie Avena M.D.   On: 10/06/2021 16:10   US PELVIC COMPLETE W TRANSVAGINAL AND TORSION R/O  Result Date: 10/06/2021 CLINICAL DATA:  Adnexal mass. Patient reports pelvic pain  for 1 week. EXAM: TRANSABDOMINAL AND TRANSVAGINAL ULTRASOUND OF PELVIS DOPPLER ULTRASOUND OF OVARIES TECHNIQUE: Both transabdominal and transvaginal ultrasound examinations of the pelvis were performed. Transabdominal technique was performed for global imaging of the pelvis including uterus, ovaries, adnexal regions, and pelvic cul-de-sac. It was necessary to proceed with endovaginal exam following the transabdominal exam to visualize the ovaries and adnexa. Color and duplex Doppler ultrasound was utilized to evaluate blood flow to the ovaries. COMPARISON:  Abdominopelvic CT earlier today. Pelvic ultrasound 03/28/2021 FINDINGS: Uterus Measurements: 7.5 x 3.9 x 5.2 cm = volume: 80 mL. The uterus is  anteverted. No fibroids or other mass visualized. Nabothian cysts in the cervix are considered incidental. Endometrium Thickness: 13 mm, mildly heterogeneous. No fluid in the endometrial canal or focal abnormality visualized. Right ovary Measurements: 2.9 x 1.4 x 2.4 cm = volume: 5.2 mL. Normal quiescent appearance with physiologic follicles. No dominant cyst or solid lesion. No adnexal mass. Normal ovarian blood flow. Left ovary Measurements: 5.1 x 3.4 x 4.4 cm = volume: 40 mL. Cyst measures 3.6 x 3.5 x 3.5 cm. No definite septations or internal complexity. Adjacent physiologic follicles. Normal ovarian blood flow. No adnexal mass. Pulsed Doppler evaluation of both ovaries demonstrates normal low-resistance arterial and venous waveforms. Other findings No abnormal free fluid. IMPRESSION: 1. Left ovarian cyst measures 3.6 cm greatest dimension. This is new from pelvic ultrasound in July. Cyst measuring less than 5 cm need no further characterization or follow-up. Normal ovarian blood flow. 2. Normal sonographic appearance of the right ovary and uterus. Electronically Signed   By: Narda RutherfordMelanie  Sanford M.D.   On: 10/06/2021 18:53    Labs: BNP (last 3 results) No results for input(s): BNP in the last 8760 hours. Basic Metabolic Panel: Recent Labs  Lab 10/06/21 1348 10/07/21 0453  NA 133* 136  K 3.9 3.7  CL 103 104  CO2 25 24  GLUCOSE 95 95  BUN 13 11  CREATININE 0.78 0.62  CALCIUM 9.0 9.1   Liver Function Tests: Recent Labs  Lab 10/06/21 1348 10/07/21 0453  AST 17 18  ALT 12 13  ALKPHOS 74 71  BILITOT 0.3 0.7  PROT 8.6* 7.9  ALBUMIN 3.7 3.4*   Recent Labs  Lab 10/06/21 1348  LIPASE 24   No results for input(s): AMMONIA in the last 168 hours. CBC: Recent Labs  Lab 10/06/21 1348 10/07/21 0453  WBC 11.5* 12.7*  NEUTROABS 9.2*  --   HGB 5.3* 7.9*  HCT 21.4* 27.7*  MCV 67.9* 72.7*  PLT 558* 440*   Cardiac Enzymes: No results for input(s): CKTOTAL, CKMB, CKMBINDEX, TROPONINI in  the last 168 hours. BNP: Invalid input(s): POCBNP CBG: No results for input(s): GLUCAP in the last 168 hours. D-Dimer No results for input(s): DDIMER in the last 72 hours. Hgb A1c No results for input(s): HGBA1C in the last 72 hours. Lipid Profile No results for input(s): CHOL, HDL, LDLCALC, TRIG, CHOLHDL, LDLDIRECT in the last 72 hours. Thyroid function studies Recent Labs    10/06/21 2115  TSH 0.248*   Anemia work up Recent Labs    10/06/21 1348 10/06/21 2115  VITAMINB12  --  370  FOLATE  --  13.6  FERRITIN  --  4*  TIBC 529* 526*  IRON 18* 32  RETICCTPCT 1.5 0.6   Urinalysis    Component Value Date/Time   COLORURINE YELLOW 10/06/2021 1348   APPEARANCEUR CLEAR 10/06/2021 1348   LABSPEC 1.025 10/06/2021 1348   PHURINE 5.0 10/06/2021 1348  GLUCOSEU NEGATIVE 10/06/2021 1348   HGBUR NEGATIVE 10/06/2021 1348   BILIRUBINUR NEGATIVE 10/06/2021 1348   BILIRUBINUR neg 07/30/2021 1458   KETONESUR NEGATIVE 10/06/2021 1348   PROTEINUR NEGATIVE 10/06/2021 1348   NITRITE NEGATIVE 10/06/2021 1348   LEUKOCYTESUR SMALL (A) 10/06/2021 1348   Sepsis Labs Invalid input(s): PROCALCITONIN,  WBC,  LACTICIDVEN Microbiology Recent Results (from the past 240 hour(s))  Resp Panel by RT-PCR (Flu A&B, Covid) Nasopharyngeal Swab     Status: None   Collection Time: 10/06/21  3:38 PM   Specimen: Nasopharyngeal Swab; Nasopharyngeal(NP) swabs in vial transport medium  Result Value Ref Range Status   SARS Coronavirus 2 by RT PCR NEGATIVE NEGATIVE Final    Comment: (NOTE) SARS-CoV-2 target nucleic acids are NOT DETECTED.  The SARS-CoV-2 RNA is generally detectable in upper respiratory specimens during the acute phase of infection. The lowest concentration of SARS-CoV-2 viral copies this assay can detect is 138 copies/mL. A negative result does not preclude SARS-Cov-2 infection and should not be used as the sole basis for treatment or other patient management decisions. A negative result  may occur with  improper specimen collection/handling, submission of specimen other than nasopharyngeal swab, presence of viral mutation(s) within the areas targeted by this assay, and inadequate number of viral copies(<138 copies/mL). A negative result must be combined with clinical observations, patient history, and epidemiological information. The expected result is Negative.  Fact Sheet for Patients:  BloggerCourse.com  Fact Sheet for Healthcare Providers:  SeriousBroker.it  This test is no t yet approved or cleared by the [place] FDA and  has been authorized for detection and/or diagnosis of SARS-CoV-2 by FDA under an Emergency Use Authorization (EUA). This EUA will remain  in effect (meaning this test can be used) for the duration of the COVID-19 declaration under Section 564(b)(1) of the Act, 21 U.S.C.section 360bbb-3(b)(1), unless the authorization is terminated  or revoked sooner.       Influenza A by PCR NEGATIVE NEGATIVE Final   Influenza B by PCR NEGATIVE NEGATIVE Final    Comment: (NOTE) The Xpert Xpress SARS-CoV-2/FLU/RSV plus assay is intended as an aid in the diagnosis of influenza from Nasopharyngeal swab specimens and should not be used as a sole basis for treatment. Nasal washings and aspirates are unacceptable for Xpert Xpress SARS-CoV-2/FLU/RSV testing.  Fact Sheet for Patients: BloggerCourse.com  Fact Sheet for Healthcare Providers: SeriousBroker.it  This test is not yet approved or cleared by the [place] FDA and has been authorized for detection and/or diagnosis of SARS-CoV-2 by FDA under an Emergency Use Authorization (EUA). This EUA will remain in effect (meaning this test can be used) for the duration of the COVID-19 declaration under Section 564(b)(1) of the Act, 21 U.S.C. section 360bbb-3(b)(1), unless the authorization is terminated  or revoked.  Performed at [facility], [address], [place], [place] [address]      Time coordinating discharge: 25 minutes  SIGNED: Lanae Boast, MD  [facility] 10/07/2021, 12:05 PM  If 7PM-7AM, please contact night-coverage [email or web address]

## 2021-10-08 LAB — URINE CULTURE: Culture: 10000 — AB

## 2021-10-11 ENCOUNTER — Other Ambulatory Visit: Payer: Self-pay | Admitting: Obstetrics

## 2021-10-11 DIAGNOSIS — D500 Iron deficiency anemia secondary to blood loss (chronic): Secondary | ICD-10-CM

## 2021-10-11 DIAGNOSIS — N939 Abnormal uterine and vaginal bleeding, unspecified: Secondary | ICD-10-CM

## 2021-10-12 ENCOUNTER — Other Ambulatory Visit: Payer: Self-pay

## 2021-10-12 ENCOUNTER — Encounter: Payer: Self-pay | Admitting: Obstetrics

## 2021-10-12 ENCOUNTER — Telehealth: Payer: Self-pay | Admitting: Obstetrics and Gynecology

## 2021-10-12 ENCOUNTER — Ambulatory Visit (INDEPENDENT_AMBULATORY_CARE_PROVIDER_SITE_OTHER): Payer: Medicaid Other | Admitting: Obstetrics

## 2021-10-12 VITALS — BP 126/84 | Ht 63.0 in | Wt 285.0 lb

## 2021-10-12 DIAGNOSIS — N83209 Unspecified ovarian cyst, unspecified side: Secondary | ICD-10-CM | POA: Diagnosis not present

## 2021-10-12 DIAGNOSIS — N921 Excessive and frequent menstruation with irregular cycle: Secondary | ICD-10-CM | POA: Diagnosis not present

## 2021-10-12 NOTE — Progress Notes (Signed)
Obstetrics & Gynecology Office Visit   Chief Complaint:  Chief Complaint  Patient presents with   Follow-up    ER Follow Up for ovarian cyst    History of Present Illness: Julie Mcclain was seen recently at the [facility] ED. She had an ultrasound to evaluate for heavy vaginal bleeding, and her iron count was extremely low. She was given 2 units of blood and an ultrasound also revealed an ovarian cyst. She was to make a f./u appt with [facility] to discuss the findings. Today, she shares that she continues on daily medication to suppress her bleeding. She is taking her iron supplement. She denies any pelvic pain or adnexal pain.   Review of Systems:  Review of Systems  Constitutional:  Positive for malaise/fatigue.  HENT: Negative.    Eyes: Negative.   Respiratory: Negative.    Cardiovascular: Negative.   Gastrointestinal: Negative.   Genitourinary: Negative.   Musculoskeletal: Negative.   Skin: Negative.   Neurological: Negative.   Endo/Heme/Allergies: Negative.   Psychiatric/Behavioral: Negative.      Past Medical History:  Past Medical History:  Diagnosis Date   Menometrorrhagia    Obesity     Past Surgical History:  Past Surgical History:  Procedure Laterality Date   denies      Gynecologic History: Patient's last menstrual period was 10/12/2021 (exact date).  Obstetric History: G1P0010  Family History:  No family history on file.  Social History:  Social History   Socioeconomic History   Marital status: Single    Spouse name: Not on file   Number of children: Not on file   Years of education: Not on file   Highest education level: Not on file  Occupational History   Not on file  Tobacco Use   Smoking status: Never   Smokeless tobacco: Never  Vaping Use   Vaping Use: Never used  Substance and Sexual Activity   Alcohol use: No   Drug use: No   Sexual activity: Yes    Birth control/protection: None, Condom  Other Topics Concern   Not on file  Social  History Narrative   Not on file   Social Determinants of Health   Financial Resource Strain: Not on file  Food Insecurity: Not on file  Transportation Needs: Not on file  Physical Activity: Not on file  Stress: Not on file  Social Connections: Not on file  Intimate Partner Violence: Not on file    Allergies:  No Known Allergies  Medications: Prior to Admission medications   Medication Sig Start Date End Date Taking? Authorizing Provider  ARIPiprazole (ABILIFY) 15 MG tablet Take 1 tablet by mouth daily. 03/22/17   [provider]  buPROPion (WELLBUTRIN XL) 300 MG 24 hr tablet Take 1 tablet by mouth daily. Patient not taking: Reported on 10/06/2021 03/22/17   [provider]  busPIRone (BUSPAR) 10 MG tablet Take 1 tablet by mouth 2 (two) times daily. 03/22/17   [provider]  esomeprazole (NEXIUM) 40 MG capsule Take 1 capsule (40 mg total) by mouth daily. 10/07/21 11/06/21  Lanae Boast, MD  ferrous sulfate 325 (65 FE) MG tablet Take 1 tablet (325 mg total) by mouth 2 (two) times daily with a meal. 10/07/21 12/06/21  Lanae Boast, MD  lamoTRIgine (LAMICTAL) 200 MG tablet Take 1 tablet by mouth daily. Patient not taking: Reported on 10/06/2021 03/22/17   [provider]  norethindrone (AYGESTIN) 5 MG tablet Take 1 tablet (5 mg total) by mouth daily for 7  days. Patient not taking: Reported on 10/06/2021 08/24/21 08/31/21  Copland, Ilona Sorrel, PA-C  ranitidine (ZANTAC) 150 MG capsule Take 1 capsule (150 mg total) by mouth 2 (two) times daily. Patient not taking: Reported on 10/06/2021 01/02/18 02/01/18  Willy Eddy, MD  VYVANSE 50 MG capsule Take 1 capsule by mouth daily. Patient not taking: Reported on 10/06/2021 03/22/17   [provider]    Physical Exam Vitals:  Vitals:   10/12/21 1402  BP: 126/84   Patient's last menstrual period was 10/12/2021 (exact date).  Physical Exam Constitutional:      Appearance: Normal appearance. She is obese.   HENT:     Head: Normocephalic and atraumatic.  Cardiovascular:     Rate and Rhythm: Normal rate and regular rhythm.     Pulses: Normal pulses.     Heart sounds: Normal heart sounds.  Pulmonary:     Effort: Pulmonary effort is normal.     Breath sounds: No wheezing.     Comments: Her breathing is "noisy" She admits to snoring. Abdominal:     Palpations: Abdomen is soft.  Genitourinary:    Comments: Deferred. Musculoskeletal:        General: Normal range of motion.     Cervical back: Normal range of motion and neck supple.  Skin:    General: Skin is warm and dry.  Psychiatric:        Mood and Affect: Mood normal.        Behavior: Behavior normal.     Assessment: 26 y.o. G1P0010 for follow up on ovarian cyst finding on ED ultrasound   Plan: Problem List Items Addressed This Visit       Other   Menometrorrhagia - Primary   Other Visit Diagnoses     Cyst of ovary, unspecified laterality          Discussed the ultrasound findings. As she has no pelvic pain related to the cyst, she is reassured re this finding. We also discussed her anemia, and how to treat with some dietary changes. Address her high BMI as a contributor to her irregular cycles. Introduced the idea of getting an IUD in order to treat her menometrorrhagia. She opts to make an appointment for IUD placement .  Mirna Mires, CNM  10/12/2021 5:28 PM

## 2021-10-12 NOTE — Telephone Encounter (Signed)
Pt is scheduled on 2/9 with [facility] for Mirena placement.

## 2021-10-13 NOTE — Telephone Encounter (Signed)
Noted. Will order to arrive by apt date/time. 

## 2021-10-16 ENCOUNTER — Encounter: Payer: Self-pay | Admitting: *Deleted

## 2021-10-21 ENCOUNTER — Inpatient Hospital Stay: Payer: Medicaid Other | Admitting: Internal Medicine

## 2021-10-21 ENCOUNTER — Inpatient Hospital Stay: Payer: Medicaid Other

## 2021-10-22 ENCOUNTER — Ambulatory Visit: Payer: Medicaid Other | Admitting: Obstetrics and Gynecology
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
## 2021-11-12 ENCOUNTER — Telehealth: Payer: Self-pay

## 2021-11-12 NOTE — Telephone Encounter (Signed)
Pt calling; is bleeding badly again.  [phone] Pt states she has been bleeding for two weeks now and it keeps getting worse; admits to saturating a pad more than 33min-1hr; adv per policy to go to ED. ?

## 2021-11-21 ENCOUNTER — Other Ambulatory Visit: Payer: Self-pay

## 2021-11-21 ENCOUNTER — Encounter: Payer: Self-pay | Admitting: Emergency Medicine

## 2021-11-21 ENCOUNTER — Emergency Department
Admission: EM | Admit: 2021-11-21 | Discharge: 2021-11-21 | Disposition: A | Discharge status: Home / Self Care | Payer: Medicaid Other | Attending: Emergency Medicine | Admitting: Emergency Medicine

## 2021-11-21 DIAGNOSIS — J020 Streptococcal pharyngitis: Secondary | ICD-10-CM | POA: Insufficient documentation

## 2021-11-21 LAB — CBC WITH DIFFERENTIAL/PLATELET
Abs Immature Granulocytes: 0.09 10*3/uL — ABNORMAL HIGH (ref 0.00–0.07)
Basophils Absolute: 0 10*3/uL (ref 0.0–0.1)
Basophils Relative: 0 %
Eosinophils Absolute: 0.1 10*3/uL (ref 0.0–0.5)
Eosinophils Relative: 1 %
HCT: 31.1 % — ABNORMAL LOW (ref 36.0–46.0)
Hemoglobin: 8.8 g/dL — ABNORMAL LOW (ref 12.0–15.0)
Immature Granulocytes: 1 %
Lymphocytes Relative: 7 %
Lymphs Abs: 1.4 10*3/uL (ref 0.7–4.0)
MCH: 22.7 pg — ABNORMAL LOW (ref 26.0–34.0)
MCHC: 28.3 g/dL — ABNORMAL LOW (ref 30.0–36.0)
MCV: 80.2 fL (ref 80.0–100.0)
Monocytes Absolute: 1.2 10*3/uL — ABNORMAL HIGH (ref 0.1–1.0)
Monocytes Relative: 6 %
Neutro Abs: 16.9 10*3/uL — ABNORMAL HIGH (ref 1.7–7.7)
Neutrophils Relative %: 85 %
Platelets: 459 10*3/uL — ABNORMAL HIGH (ref 150–400)
RBC: 3.88 MIL/uL (ref 3.87–5.11)
RDW: 22.5 % — ABNORMAL HIGH (ref 11.5–15.5)
WBC: 19.7 10*3/uL — ABNORMAL HIGH (ref 4.0–10.5)
nRBC: 0 % (ref 0.0–0.2)

## 2021-11-21 LAB — BASIC METABOLIC PANEL
Anion gap: 6 (ref 5–15)
BUN: 9 mg/dL (ref 6–20)
CO2: 26 mmol/L (ref 22–32)
Calcium: 8.7 mg/dL — ABNORMAL LOW (ref 8.9–10.3)
Chloride: 103 mmol/L (ref 98–111)
Creatinine, Ser: 0.81 mg/dL (ref 0.44–1.00)
GFR, Estimated: >60 mL/min (ref >60)
Glucose, Bld: 92 mg/dL (ref 70–99)
Potassium: 3.8 mmol/L (ref 3.5–5.1)
Sodium: 135 mmol/L (ref 135–145)

## 2021-11-21 LAB — GROUP A STREP BY PCR: Group A Strep by PCR: DETECTED — AB

## 2021-11-21 LAB — CHLAMYDIA/NGC RT PCR (ARMC ONLY)
Chlamydia Tr: NOT DETECTED
N gonorrhoeae: NOT DETECTED

## 2021-11-21 MED ORDER — SODIUM CHLORIDE 0.9 % IV SOLN
3.0000 g | Freq: Once | INTRAVENOUS | Status: AC
Start: 2021-11-21 — End: 2021-11-21
  Administered 2021-11-21: 3 g via INTRAVENOUS
  Filled 2021-11-21: qty 8

## 2021-11-21 MED ORDER — KETOROLAC TROMETHAMINE 30 MG/ML IJ SOLN
15.0000 mg | Freq: Once | INTRAMUSCULAR | Status: AC
Start: 2021-11-21 — End: 2021-11-21
  Administered 2021-11-21: 15 mg via INTRAVENOUS
  Filled 2021-11-21: qty 1

## 2021-11-21 MED ORDER — LIDOCAINE VISCOUS HCL 2 % MT SOLN: 15.0000 mL | OROMUCOSAL | 0 refills | Status: DC | PRN

## 2021-11-21 MED ORDER — AMOXICILLIN-POT CLAVULANATE 250-62.5 MG/5ML PO SUSR: 875.0000 mg | Freq: Two times a day (BID) | ORAL | 0 refills | Status: AC

## 2021-11-21 MED ORDER — PREDNISONE 10 MG PO TABS: 50.0000 mg | ORAL_TABLET | Freq: Every day | ORAL | 0 refills | Status: DC

## 2021-11-21 MED ORDER — SODIUM CHLORIDE 0.9 % IV BOLUS
1000.0000 mL | Freq: Once | INTRAVENOUS | Status: AC
  Administered 2021-11-21: 1000 mL via INTRAVENOUS

## 2021-11-21 NOTE — ED Notes (Signed)
Pt given water to drink, per EDP 

## 2021-11-21 NOTE — ED Provider Notes (Signed)
? ?[facility] ?Provider Note ? ? ? Event Date/Time  ? First MD Initiated Contact with Patient 11/21/21 0756   ?  (approximate) ? ? ?History  ? ?Sore Throat ? ? ?HPI ? ?Julie Mcclain is a 26 y.o. female with history of anemia and as listed in EMR presents to the emergency department for treatment and evaluation of sore throat for the past week.  No known fever.  Patient is also concerned that she may have an STI in her throat but denies any known exposure. ? ?  ? ? ?Physical Exam  ? ?Triage Vital Signs: ?ED Triage Vitals  ?Enc Vitals Group  ?   BP 11/21/21 0747 114/64  ?   Pulse Rate 11/21/21 0747 (!) 127  ?   Resp 11/21/21 0747 20  ?   Temp 11/21/21 0747 98.2 ?F (36.8 ?C)  ?   Temp Source 11/21/21 0747 Oral  ?   SpO2 11/21/21 0747 95 %  ?   Weight 11/21/21 0743 297 lb 9.9 oz (135 kg)  ?   Height 11/21/21 0743 5\' 3"  (1.6 m)  ?   Head Circumference --   ?   Peak Flow --   ?   Pain Score 11/21/21 0743 9  ?   Pain Loc --   ?   Pain Edu? --   ?   Excl. in GC? --   ? ? ?Most recent vital signs: ?Vitals:  ? 11/21/21 0747 11/21/21 1128  ?BP: 114/64   ?Pulse: (!) 127 (!) 117  ?Resp: 20   ?Temp: 98.2 ?F (36.8 ?C)   ?SpO2: 95%   ? ? ?General: Awake, no distress.  ?CV:  Good peripheral perfusion.  ?Resp:  Normal effort.  ?Abd:  No distention.  ?Other:  Tonsillar swelling and exudate. Uvula midline ? ? ?ED Results / Procedures / Treatments  ? ?Labs ?(all labs ordered are listed, but only abnormal results are displayed) ?Labs Reviewed  ?GROUP A STREP BY PCR - Abnormal; Notable for the following components:  ?    Result Value  ? Group A Strep by PCR DETECTED (*)   ? All other components within normal limits  ?CBC WITH DIFFERENTIAL/PLATELET - Abnormal; Notable for the following components:  ? WBC 19.7 (*)   ? Hemoglobin 8.8 (*)   ? HCT 31.1 (*)   ? MCH 22.7 (*)   ? MCHC 28.3 (*)   ? RDW 22.5 (*)   ? Platelets 459 (*)   ? Neutro Abs 16.9 (*)   ? Monocytes Absolute 1.2 (*)   ? Abs Immature Granulocytes 0.09  (*)   ? All other components within normal limits  ?BASIC METABOLIC PANEL - Abnormal; Notable for the following components:  ? Calcium 8.7 (*)   ? All other components within normal limits  ?CHLAMYDIA/NGC RT PCR ([facility] ONLY)            ? ? ? ?EKG ? ?Not indicated ? ? ?RADIOLOGY ? ?Image and radiology report reviewed by me. ? ?Not indicated ? ?PROCEDURES: ? ?Critical Care performed: No ? ?Procedures ? ? ?MEDICATIONS ORDERED IN ED: ?Medications  ?sodium chloride 0.9 % bolus 1,000 mL (0 mLs Intravenous Stopped 11/21/21 1104)  ?ketorolac (TORADOL) 30 MG/ML injection 15 mg (15 mg Intravenous Given 11/21/21 1029)  ?Ampicillin-Sulbactam (UNASYN) 3 g in sodium chloride 0.9 % 100 mL IVPB (0 g Intravenous Stopped 11/21/21 1104)  ? ? ? ?IMPRESSION / MDM / ASSESSMENT AND PLAN / ED COURSE  ? ?  I have reviewed the triage note. ? ?Differential diagnosis includes, but is not limited to, strep throat, STI infection, viral syndrome, peritonsillar abscess. ? ?26 year old female presenting to the emergency department for treatment and evaluation of sore throat.  See HPI for further details. ? ?On exam, her tonsils are enlarged bilaterally with exudates.  Uvula is midline.  Patient's voice is hoarse but not muffled and she is tolerating secretions.  Strep screen is positive. ? ?Patient noted to be tachycardic.  Labs and IV fluids ordered along with Unasyn and toradol. ? ?Patient feeling much improved.  She is still slightly tachycardic at 116 but feeling much better.  She will be discharged with prescription for liquid Augmentin per her request along with prednisone and viscous lidocaine.  She was advised that if she experiences any airway issues or is unable to swallow her saliva that she is to return to the emergency department.  Otherwise, if she is not improving over the next 2 to 3 days she is to follow-up with her primary care provider. ? ?  ? ? ?FINAL CLINICAL IMPRESSION(S) / ED DIAGNOSES  ? ?Final diagnoses:  ?Strep throat  ? ? ? ?Rx  / DC Orders  ? ?ED Discharge Orders   ? ?      Ordered  ?  amoxicillin-clavulanate (AUGMENTIN) 250-62.5 MG/5ML suspension  2 times daily       ? 11/21/21 1112  ?  predniSONE (DELTASONE) 10 MG tablet  Daily       ? 11/21/21 1112  ?  lidocaine (XYLOCAINE) 2 % solution  As needed       ? 11/21/21 1112  ? ?  ?  ? ?  ? ? ? ?Note:  This document was prepared using Dragon voice recognition software and may include unintentional dictation errors. ?  ?Chinita Pester, FNP ?11/21/21 1132 ? ?  ?Shaune Pollack, MD ?11/22/21 0930 ? ?

## 2021-11-21 NOTE — ED Triage Notes (Signed)
Pt reports sore throat for the last week, denies fevers but reports chills, nasal congestion and cough. ?

## 2021-11-21 NOTE — ED Notes (Signed)
Pt with sore throat x a week. Pt states she has dry cough and it is hard to swallow. Pt in NAD at this time. Pt's with redness to back of throat.  ?

## 2021-11-21 NOTE — Discharge Instructions (Signed)
Take Tylenol or ibuprofen for pain or fever. ? ?Follow-up with your primary care provider if not improving over the next 2 to 3 days.  Return to the emergency department immediately if you are unable to swallow or you feel that you are airway is blocked. ?

## 2022-01-06 ENCOUNTER — Ambulatory Visit (INDEPENDENT_AMBULATORY_CARE_PROVIDER_SITE_OTHER): Payer: Medicaid Other

## 2022-01-06 DIAGNOSIS — O0990 Supervision of high risk pregnancy, unspecified, unspecified trimester: Secondary | ICD-10-CM

## 2022-01-06 DIAGNOSIS — Z3689 Encounter for other specified antenatal screening: Secondary | ICD-10-CM

## 2022-01-06 HISTORY — DX: Supervision of high risk pregnancy, unspecified, unspecified trimester: O09.90

## 2022-01-06 NOTE — Initial Assessments (Signed)
New OB Intake ? ?I connected with  Julie Mcclain on 01/06/22 at 10:15 AM EDT by telephone Telephone Visit and verified that I am speaking with the correct person using two identifiers. Nurse is located at [facility] and pt is located at home.  Pt currently lives with her mother who is very supportive. ? ?I explained I am completing New OB Intake today. We discussed her EDD of 08/23/2022 that is based on LMP of 11/16/2021. Pt is G2/P0010. I reviewed her allergies, medications, Medical/Surgical/OB history, and appropriate screenings.  Based on history, this is a/an  pregnancy complicated by depression, anxiety, meds; pt has a miscarriage Feb this year.  Has not comfirmed pregnancy d/t no money to get preg test; adv she may can go to the HD for one; if negative to still keep appt on 5/15th for evaluation.  ? ?Patient Active Problem List  ? Diagnosis Date Noted  ? Generalized weakness 10/06/2021  ? Iron deficiency anemia due to chronic blood loss 05/04/2021  ? Menometrorrhagia 05/04/2021  ? ? ?Concerns addressed today ? ?Delivery Plans:  ?Plans to deliver at [facility]  ? ? [place] ?Explained first scheduled [place] will be scheduled at her first visit with provider. ? ?Labs ?Pt aware NOB labs will be drawn at first visit with provider. ? ?Patient has not covid vaccine.   ? ?Social Determinants of Health ?Food Insecurity: Pt admits sometimes has food insecurity. Transportation: Patient denies transportation needs. Pt does not have a car right now.  Her mom drives her. ? ?Placed OB Box on problem list and updated ? ?First visit review ?I reviewed new OB appt with pt. I explained she will have a pelvic exam, ob bloodwork with genetic screening, and PAP smear. Explained pt will be seen by Tresea Mall, CNM at first visit; encounter routed to appropriate provider.   ? ?Loran Senters, [facility]) ?01/06/2022  10:26 AM  ? ? ?

## 2022-01-25 ENCOUNTER — Encounter: Payer: Medicaid Other | Admitting: Advanced Practice Midwife

## 2022-01-28 ENCOUNTER — Encounter: Payer: Medicaid Other | Admitting: Obstetrics and Gynecology

## 2022-02-09 ENCOUNTER — Other Ambulatory Visit: Payer: Self-pay

## 2022-02-09 ENCOUNTER — Emergency Department: Payer: Medicaid Other

## 2022-02-09 ENCOUNTER — Emergency Department
Admission: EM | Admit: 2022-02-09 | Discharge: 2022-02-09 | Disposition: A | Discharge status: Home / Self Care | Payer: Medicaid Other | Attending: Emergency Medicine | Admitting: Emergency Medicine

## 2022-02-09 DIAGNOSIS — N390 Urinary tract infection, site not specified: Secondary | ICD-10-CM | POA: Insufficient documentation

## 2022-02-09 DIAGNOSIS — R059 Cough, unspecified: Secondary | ICD-10-CM | POA: Diagnosis present

## 2022-02-09 DIAGNOSIS — U071 COVID-19: Secondary | ICD-10-CM | POA: Insufficient documentation

## 2022-02-09 LAB — URINALYSIS, ROUTINE W REFLEX MICROSCOPIC
Bilirubin Urine: NEGATIVE
Glucose, UA: NEGATIVE mg/dL
Hgb urine dipstick: NEGATIVE
Ketones, ur: NEGATIVE mg/dL
Nitrite: NEGATIVE
Protein, ur: NEGATIVE mg/dL
Specific Gravity, Urine: 1.027 (ref 1.005–1.030)
WBC, UA: >50 WBC/hpf — ABNORMAL HIGH (ref 0–5)
pH: 5 (ref 5.0–8.0)

## 2022-02-09 LAB — SARS CORONAVIRUS 2 BY RT PCR: SARS Coronavirus 2 by RT PCR: POSITIVE — AB

## 2022-02-09 MED ORDER — LIDOCAINE HCL (PF) 1 % IJ SOLN
5.0000 mL | Freq: Once | INTRAMUSCULAR | Status: DC
  Filled 2022-02-09: qty 5

## 2022-02-09 MED ORDER — ACETAMINOPHEN 325 MG PO TABS
650.0000 mg | ORAL_TABLET | Freq: Once | ORAL | Status: AC
  Administered 2022-02-09: 650 mg via ORAL
  Filled 2022-02-09: qty 2

## 2022-02-09 MED ORDER — NITROFURANTOIN MONOHYD MACRO 100 MG PO CAPS: 100.0000 mg | ORAL_CAPSULE | Freq: Two times a day (BID) | ORAL | 0 refills | Status: AC

## 2022-02-09 NOTE — ED Triage Notes (Signed)
Pt comes with c/o piercing infection under lip. Pt also states runny nose and cold.

## 2022-02-09 NOTE — ED Provider Notes (Signed)
[facility] Provider Note    Event Date/Time   First MD Initiated Contact with Patient 02/09/22 1449     (approximate)   History   piercing infection   HPI  Julie Mcclain is a 26 y.o. female presents emergency department with runny nose, congestion and cough.  Patient did not know that she had a fever.  She also has a piercing in her lower lip that needs to be removed.  States she has had no pus redness or drainage from the area.  Is just been embedded in her lip for a while.      Physical Exam   Triage Vital Signs: ED Triage Vitals  Enc Vitals Group     BP 02/09/22 1351 114/82     Pulse Rate 02/09/22 1351 (!) 120     Resp 02/09/22 1351 18     Temp 02/09/22 1351 98 F (36.7 C)     Temp Source 02/09/22 1353 Oral     SpO2 02/09/22 1351 97 %     Weight --      Height --      Head Circumference --      Peak Flow --      Pain Score 02/09/22 1350 5     Pain Loc --      Pain Edu? --      Excl. in GC? --     Most recent vital signs: Vitals:   02/09/22 1351 02/09/22 1353  BP: 114/82   Pulse: (!) 120   Resp: 18   Temp: 98 F (36.7 C) (!) 102.6 F (39.2 C)  SpO2: 97%      General: Awake, no distress.   CV:  Good peripheral perfusion. regular rate and  rhythm Resp:  Normal effort. Lungs CTA Abd:  No distention.   Other:  Lower lip shows no redness or infection, upper lip shows no redness or infection, piercing is noted to be embedded in the upper lip.   ED Results / Procedures / Treatments   Labs (all labs ordered are listed, but only abnormal results are displayed) Labs Reviewed  SARS CORONAVIRUS 2 BY RT PCR - Abnormal; Notable for the following components:      Result Value   SARS Coronavirus 2 by RT PCR POSITIVE (*)    All other components within normal limits  URINALYSIS, ROUTINE W REFLEX MICROSCOPIC - Abnormal; Notable for the following components:   Color, Urine YELLOW (*)    APPearance CLOUDY (*)    Leukocytes,Ua LARGE  (*)    WBC, UA >50 (*)    Bacteria, UA RARE (*)    Non Squamous Epithelial PRESENT (*)    All other components within normal limits     EKG     RADIOLOGY Chest x-ray    PROCEDURES:   Procedures   MEDICATIONS ORDERED IN ED: Medications  acetaminophen (TYLENOL) tablet 650 mg (650 mg Oral Given 02/09/22 1356)     IMPRESSION / MDM / ASSESSMENT AND PLAN / ED COURSE  I reviewed the triage vital signs and the nursing notes.                              Differential diagnosis includes, but is not limited to, COVID, influenza, CAP, cellulitis  Patient's presentation is most consistent with acute complicated illness / injury requiring diagnostic workup.   Patient is febrile so she was given Tylenol.  Heart rate  is elevated most likely due to the fever.  COVID test is positive  Chest x-ray chest x-ray was interpreted by me as being negative  UA shows large amount of leuks greater than 50 WBCs with rare bacteria.  Patient will be given a prescription for Macrobid for her UTI.  Due to side effects of Paxlovid and patient does not have a lot of comorbidities will not start her on Paxlovid.  She opts to defer Paxlovid.  She is to quarantine for 1 week.  Follow-up with her regular doctor as needed.  Return if worsening.  Patient is in agreement treatment plan she was discharged in stable condition.        FINAL CLINICAL IMPRESSION(S) / ED DIAGNOSES   Final diagnoses:  COVID-19  Acute UTI     Rx / DC Orders   ED Discharge Orders          Ordered    nitrofurantoin, macrocrystal-monohydrate, (MACROBID) 100 MG capsule  2 times daily        02/09/22 1702             Note:  This document was prepared using Dragon voice recognition software and may include unintentional dictation errors.    Faythe Ghee, PA-C 02/09/22 1704    Merwyn Katos, MD 02/09/22 [phone]

## 2022-02-09 NOTE — ED Notes (Signed)
Pt to ED for coldlike symptoms since 2 days, with stuffy nose and cough.  Pt has lip piercing since 2 years that has been trapped in upper lip below tissue that grew over piercing hole 2 years ago. Pt concerned about infection but to this RN inner lip does not appear infected. Pt states she "just wants to get it out" and came to ED to have lip piercing removed.

## 2022-02-09 NOTE — Discharge Instructions (Addendum)

## 2022-02-17 DIAGNOSIS — R7303 Prediabetes: Secondary | ICD-10-CM | POA: Insufficient documentation

## 2022-02-17 DIAGNOSIS — E119 Type 2 diabetes mellitus without complications: Secondary | ICD-10-CM | POA: Insufficient documentation

## 2022-02-17 DIAGNOSIS — E785 Hyperlipidemia, unspecified: Secondary | ICD-10-CM | POA: Insufficient documentation

## 2022-02-17 DIAGNOSIS — F411 Generalized anxiety disorder: Secondary | ICD-10-CM | POA: Insufficient documentation

## 2022-03-12 IMAGING — US US PELVIS COMPLETE WITH TRANSVAGINAL
1 series · 13 of 25 positions shown · non-contrast
Comparison: CT abdomen pelvis dated May 08, 2017. Pelvic
ultrasound dated April 16, 2015.

CLINICAL DATA: Vaginal bleeding for the past 2 weeks.

EXAM:
TRANSABDOMINAL AND TRANSVAGINAL ULTRASOUND OF PELVIS
TECHNIQUE: Both transabdominal and transvaginal ultrasound examinations of the
pelvis were performed. Transabdominal technique was performed for
global imaging of the pelvis including uterus, ovaries, adnexal
regions, and pelvic cul-de-sac. It was necessary to proceed with
endovaginal exam following the transabdominal exam to visualize the
endometrium and ovaries.

[Series 1: us pelvic complete with transvaginal · 94 acquisitions, 13 frames shown]
[im 1/94]
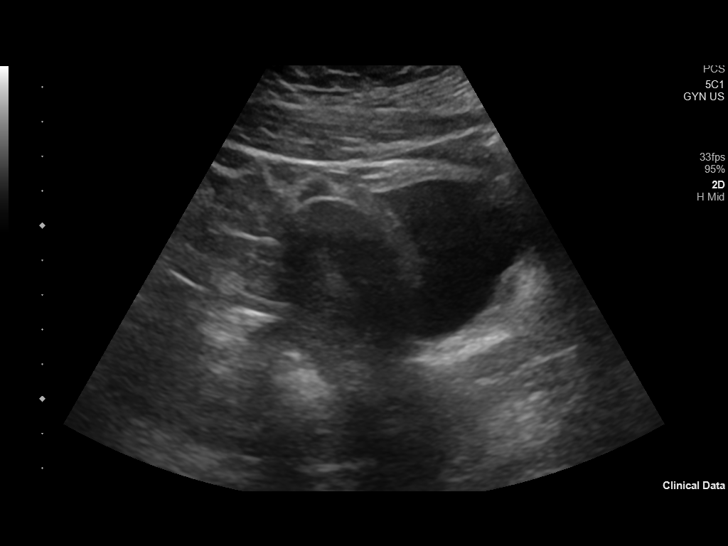
[im 8/94]
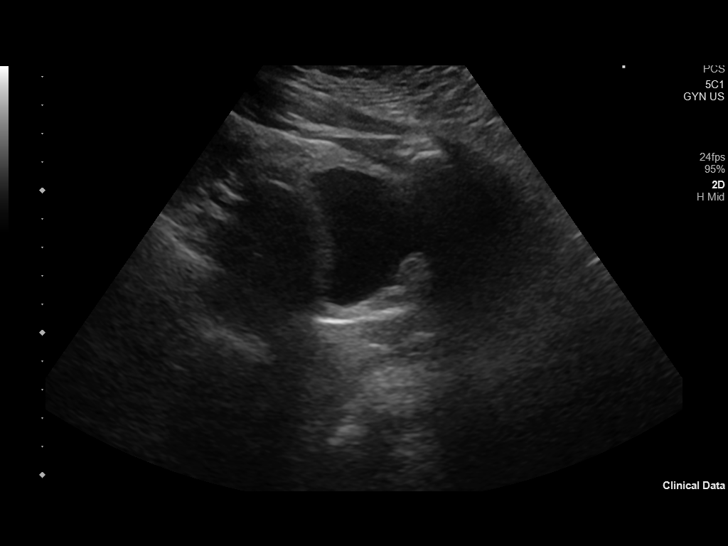
[im 16/94]
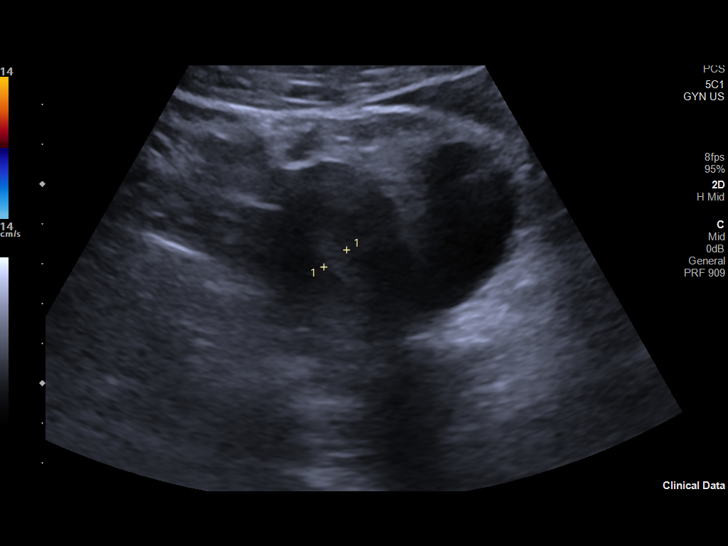
[im 24/94]
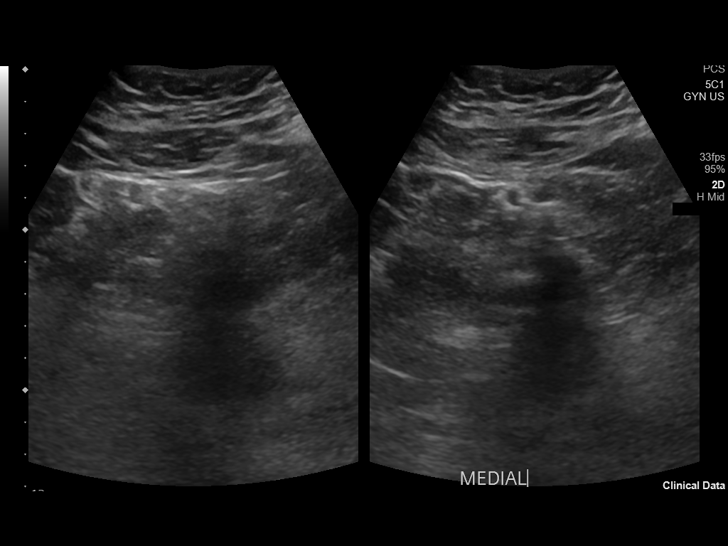
[im 32/94]
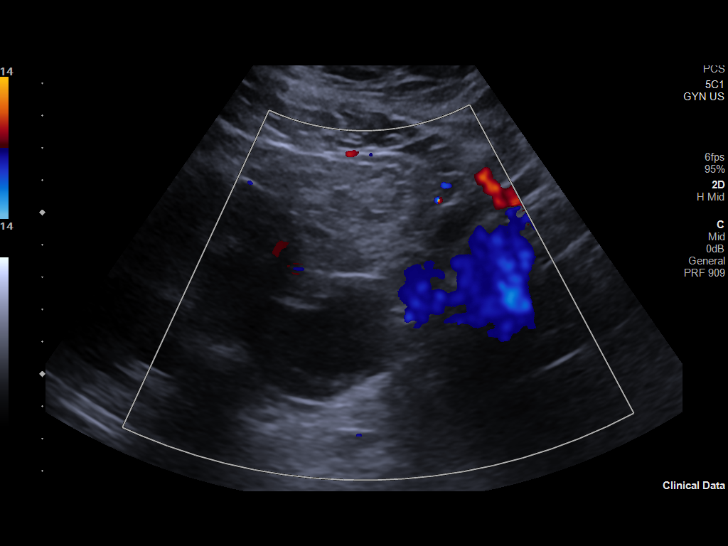
[im 39/94]
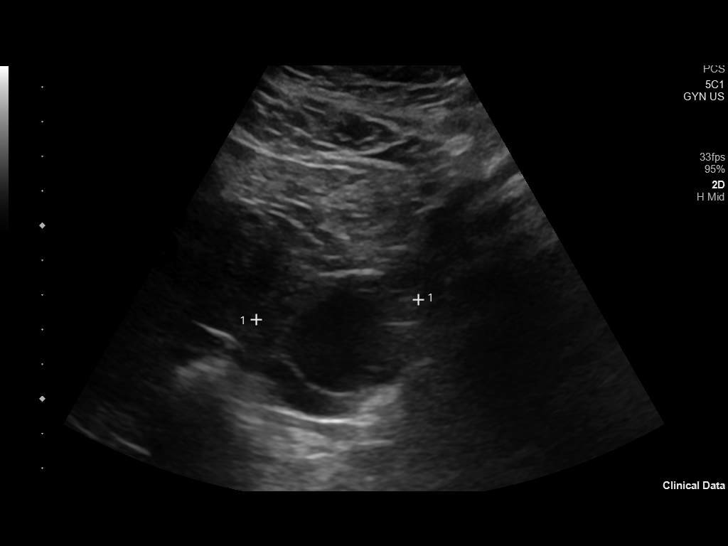
[im 47/94]
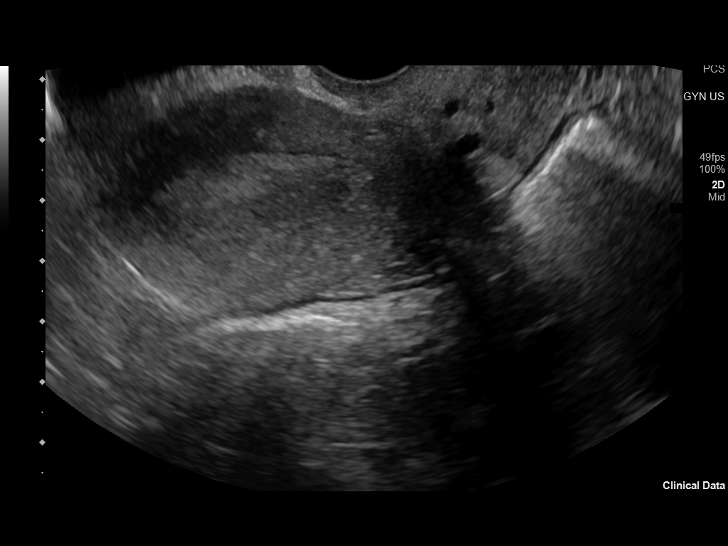
[im 55/94]
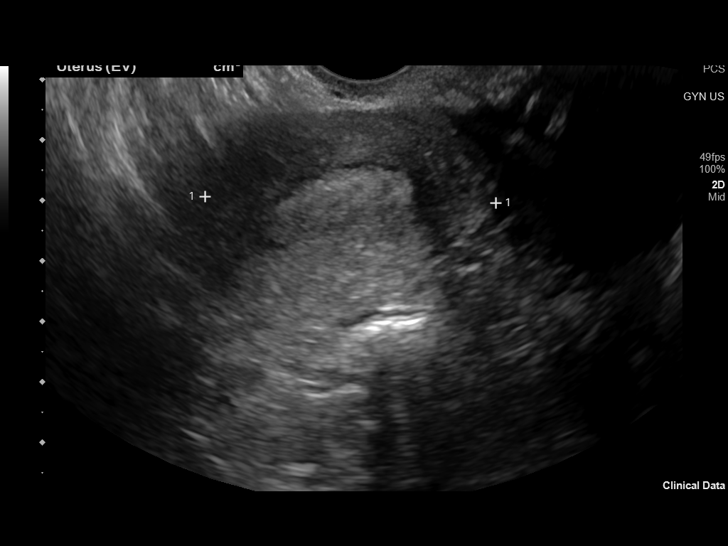
[im 63/94]
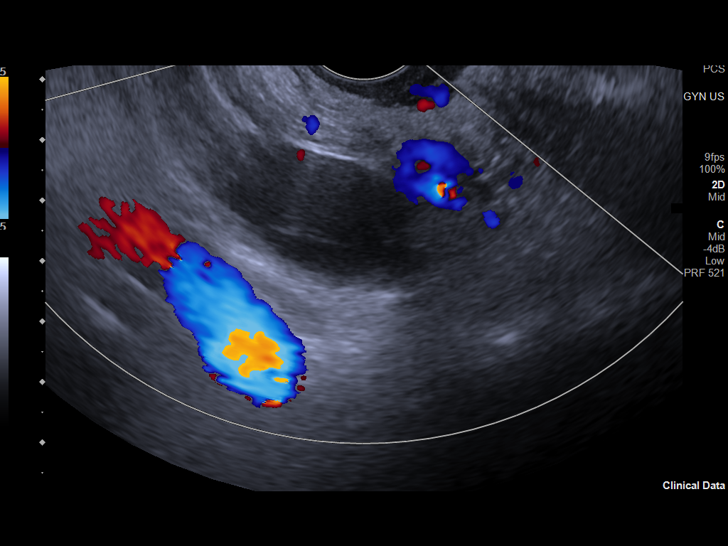
[im 70/94]
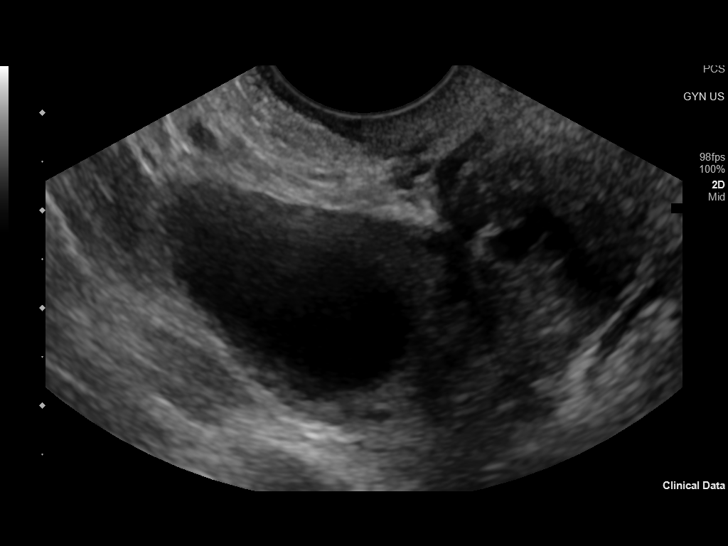
[im 78/94]
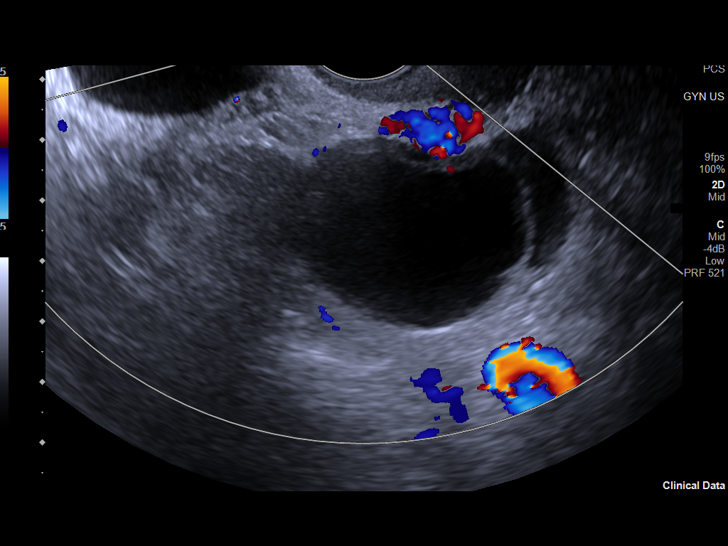
[im 86/94]
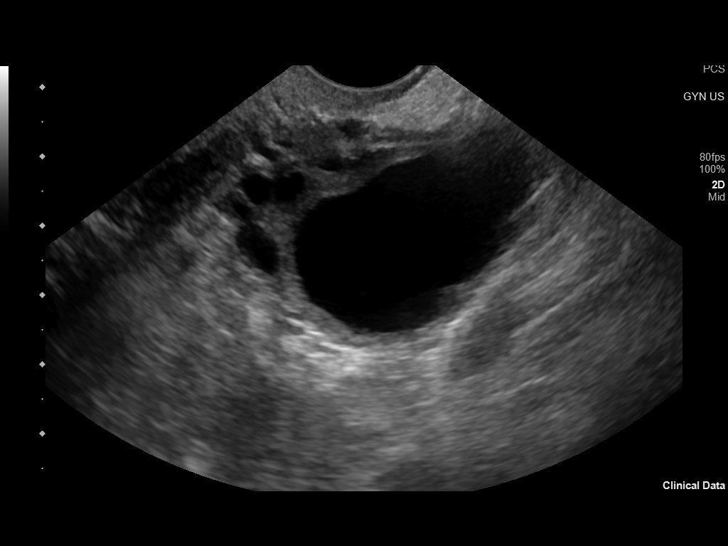
[im 94/94]
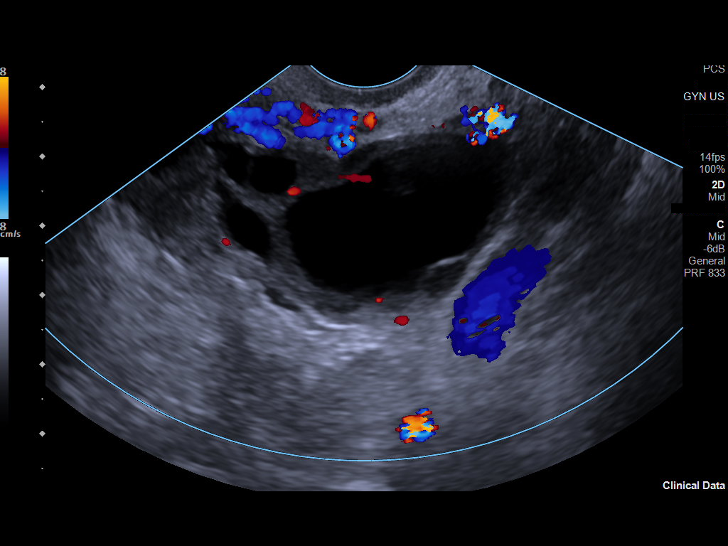

[13 of 25 positions shown; findings below may reference images not displayed]

FINDINGS: Uterus

Measurements: 9.0 x 3.7 x 4.8 cm = volume: 85 mL. No fibroids or
other mass visualized.

Endometrium

Thickness: 8 mm.  No focal abnormality visualized.

Right ovary

Measurements: 3.9 x 2.3 x 3.2 cm = volume: 14.4 mL. 2.6 x 1.9 x
cm simple cyst.

Left ovary

Measurements: 5.1 x 3.3 x 4.2 cm = volume: 37.1 mL. 3.8 x 2.8 x
cm simple cyst, previously 3.5 x 2.2 x 3.3 cm. 2.2 cm dominant
follicle adjacent to the simple cyst.

Other findings

No abnormal free fluid.
IMPRESSION: 1. No acute abnormality.
2. Bilateral ovarian simple cysts, the largest in the left ovary
measures up to 3.8 cm, previously 3.5 cm. No follow up imaging
recommended. Note: This recommendation does not apply to
premenarchal patients or to those with increased risk (genetic,
family history, elevated tumor markers or other high-risk factors)
of ovarian cancer. Reference: Radiology [DATE]):[id number].

## 2022-04-29 ENCOUNTER — Ambulatory Visit: Payer: Medicaid Other | Admitting: Obstetrics & Gynecology

## 2022-05-10 ENCOUNTER — Ambulatory Visit: Payer: Medicaid Other | Admitting: Obstetrics & Gynecology

## 2022-07-14 ENCOUNTER — Ambulatory Visit: Payer: Medicaid Other | Admitting: Nurse Practitioner

## 2022-07-14 ENCOUNTER — Encounter: Payer: Self-pay | Admitting: Nurse Practitioner

## 2022-07-14 DIAGNOSIS — Z113 Encounter for screening for infections with a predominantly sexual mode of transmission: Secondary | ICD-10-CM

## 2022-07-14 DIAGNOSIS — F319 Bipolar disorder, unspecified: Secondary | ICD-10-CM | POA: Insufficient documentation

## 2022-07-14 DIAGNOSIS — F419 Anxiety disorder, unspecified: Secondary | ICD-10-CM | POA: Insufficient documentation

## 2022-07-14 DIAGNOSIS — F339 Major depressive disorder, recurrent, unspecified: Secondary | ICD-10-CM | POA: Insufficient documentation

## 2022-07-14 DIAGNOSIS — F909 Attention-deficit hyperactivity disorder, unspecified type: Secondary | ICD-10-CM | POA: Insufficient documentation

## 2022-07-14 DIAGNOSIS — A539 Syphilis, unspecified: Secondary | ICD-10-CM | POA: Diagnosis not present

## 2022-07-14 MED ORDER — PENICILLIN G BENZATHINE 1200000 UNIT/2ML IM SUSY
2.4000 10*6.[IU] | PREFILLED_SYRINGE | INTRAMUSCULAR | Status: AC
  Administered 2022-07-14 – 2022-07-28 (×3): 2.4 10*6.[IU] via INTRAMUSCULAR

## 2022-07-14 NOTE — Progress Notes (Signed)
[facility] Department  STI clinic/screening visit [place] [address] [phone]  Subjective:  Julie Mcclain is a 26 y.o. female being seen today for an STI screening visit. The patient reports they do have symptoms.  Patient reports that they do desire a pregnancy in the next year.   They reported they are not interested in discussing contraception today.    Patient's last menstrual period was 06/15/2022 (approximate).   Patient has the following medical conditions:   Patient Active Problem List   Diagnosis Date Noted   ADHD 07/14/2022   Bipolar 1 disorder ([place]) 07/14/2022   Anxiety 07/14/2022   Depression, recurrent ([place]) 07/14/2022   Supervision of high risk pregnancy, antepartum 01/06/2022   Generalized weakness 10/06/2021   Iron deficiency anemia due to chronic blood loss 05/04/2021   Menometrorrhagia 05/04/2021    Chief Complaint  Patient presents with   SEXUALLY TRANSMITTED DISEASE    Treatment for syphilis     HPI  Patient reports to clinic today to receive treatment for Syphilis.  Patient had an RPR drawn 07/08/22, titer 1:16.  Reports no symptoms today, with the exception of lower abdominal pain that has been present for a couple of months.    Does the patient using douching products? No  Last HIV test per patient/review of record was: Lab Results  Component Value Date   HIV Non Reactive 10/06/2021   Patient reports last pap was: Lab Results  Component Value Date   DIAGPAP  05/04/2021    - Negative for intraepithelial lesion or malignancy (NILM)     Screening for MPX risk: Does the patient have an unexplained rash? No Is the patient MSM? No Does the patient endorse multiple sex partners or anonymous sex partners? No Did the patient have close or sexual contact with a person diagnosed with MPX? No Has the patient traveled outside the [place] where MPX is endemic? No Is there a high clinical suspicion for  MPX-- evidenced by one of the following No  -Unlikely to be chickenpox  -Lymphadenopathy  -Rash that present in same phase of evolution on any given body part See flowsheet for further details and programmatic requirements.   Immunization history:  Immunization History  Administered Date(s) Administered   HPV Quadrivalent 03/22/2007, 04/11/2009   Hepatitis A, Ped/Adol-2 Dose 03/22/2007, 04/11/2009   Hepatitis B, PED/ADOLESCENT 10/04/1994, 12/06/1994, 03/28/1995   MMR 10/27/1995, 04/26/2000   Meningococcal Conjugate 03/22/2007   Tdap 03/22/2007   Varicella 04/26/2000, 03/22/2007     The following portions of the patient's history were reviewed and updated as appropriate: allergies, current medications, past medical history, past social history, past surgical history and problem list.  Objective:  There were no vitals filed for this visit.  Physical Exam Constitutional:      Appearance: Normal appearance.  HENT:     Head: Normocephalic. No abrasion, masses or laceration. Hair is normal.     Right Ear: External ear normal.     Left Ear: External ear normal.     Nose: Nose normal.     Mouth/Throat:     Lips: Pink.     Mouth: Mucous membranes are moist. No oral lesions.     Pharynx: No oropharyngeal exudate or posterior oropharyngeal erythema.     Tonsils: No tonsillar exudate or tonsillar abscesses.  Eyes:     General: Lids are normal.        Right eye: No discharge.  Left eye: No discharge.     Conjunctiva/sclera: Conjunctivae normal.     Right eye: No exudate.    Left eye: No exudate. Abdominal:     General: Abdomen is flat.     Palpations: Abdomen is soft.     Tenderness: There is no abdominal tenderness. There is no rebound.  Genitourinary:    Pubic Area: No rash or pubic lice.      Labia:        Right: No rash, tenderness, lesion or injury.        Left: No rash, tenderness, lesion or injury.      Vagina: Normal. No vaginal discharge, erythema or lesions.      Rectum: Normal.  Musculoskeletal:     Cervical back: Full passive range of motion without pain, normal range of motion and neck supple.  Lymphadenopathy:     Cervical: No cervical adenopathy.     Right cervical: No superficial, deep or posterior cervical adenopathy.    Left cervical: No superficial, deep or posterior cervical adenopathy.     Upper Body:     Right upper body: No supraclavicular, axillary or epitrochlear adenopathy.     Left upper body: No supraclavicular, axillary or epitrochlear adenopathy.     Lower Body: No right inguinal adenopathy. No left inguinal adenopathy.  Skin:    General: Skin is warm and dry.     Findings: No lesion or rash.  Neurological:     Mental Status: She is alert and oriented to person, place, and time.  Psychiatric:        Attention and Perception: Attention normal.        Mood and Affect: Mood normal.        Speech: Speech normal.        Behavior: Behavior normal. Behavior is cooperative.      Assessment and Plan:  Julie Mcclain is a 26 y.o. female presenting to the [facility] Department for STI screening  1. Syphilis -RPR 1:16 on 07/08/22 through [employer] .  Patient's is asymptomatic and last RPR results were in 2021.  Patient will need Bicillin 2.4 million units x 3 doses.  Advised patient not to have sex until 14 days after the completion of third dose.   Patient will need RPR follow up every 6 months.   - penicillin g benzathine (BICILLIN LA) 1200000 UNIT/2ML injection 2.4 Million Units  2. Screening examination for venereal disease -No STD screening performed today, patient reports that she had screenings done on 07/08/22 through her PCP.    Total time spent: 30 minutes    Return in about 7 weeks (around 09/01/2022).   Gregary Cromer, FNP

## 2022-07-21 ENCOUNTER — Ambulatory Visit: Payer: Medicaid Other | Admitting: Family Medicine

## 2022-07-21 DIAGNOSIS — A539 Syphilis, unspecified: Secondary | ICD-10-CM | POA: Diagnosis not present

## 2022-07-21 DIAGNOSIS — Z113 Encounter for screening for infections with a predominantly sexual mode of transmission: Secondary | ICD-10-CM | POA: Diagnosis not present

## 2022-07-21 MED ORDER — PENICILLIN G BENZATHINE 1200000 UNIT/2ML IM SUSY: 2.4000 10*6.[IU] | PREFILLED_SYRINGE | Freq: Once | INTRAMUSCULAR | Status: DC

## 2022-07-21 NOTE — Progress Notes (Unsigned)
[facility] Department  STI clinic/screening visit [place] [address] [phone]  Subjective:  Janel Markeita Alicia is a 26 y.o. female being seen today for an STI screening visit. The patient reports they do not have symptoms.  Patient reports that they do desire a pregnancy in the next year.   They reported they are not interested in discussing contraception today.  LMP 06/15/22, hasn't had sex since last period. She states she believes she hasn't had a period because she is feeling a lot of stress.   Patient's last menstrual period was 06/15/2022 (approximate).  Patient has the following medical conditions:   Patient Active Problem List   Diagnosis Date Noted   ADHD 07/14/2022   Bipolar 1 disorder ([place]) 07/14/2022   Anxiety 07/14/2022   Depression, recurrent ([place]) 07/14/2022   Supervision of high risk pregnancy, antepartum 01/06/2022   Generalized weakness 10/06/2021   Iron deficiency anemia due to chronic blood loss 05/04/2021   Menometrorrhagia 05/04/2021    No chief complaint on file.   HPI  Patient reports to clinic today to receive treatment for Syphilis. Pt had RPR on 07/08/22, titer 1:16. Received Bicillin 2.4 million units on 07/14/2022. Today will get second dose. Next dose in 1 week. Will need RPR follow up every 6 months.   Does the patient using douching products? No  Last HIV test per patient/review of record was No results found for: "HMHIVSCREEN"  Lab Results  Component Value Date   HIV Non Reactive 10/06/2021   Patient reports last pap was  Lab Results  Component Value Date   DIAGPAP  05/04/2021    - Negative for intraepithelial lesion or malignancy (NILM)     Screening for MPX risk: Does the patient have an unexplained rash? No Is the patient MSM? No Does the patient endorse multiple sex partners or anonymous sex partners? No Did the patient have close or sexual contact with a person diagnosed with MPX?  No Has the patient traveled outside the [place] where MPX is endemic? No Is there a high clinical suspicion for MPX-- evidenced by one of the following No  -Unlikely to be chickenpox  -Lymphadenopathy  -Rash that present in same phase of evolution on any given body part See flowsheet for further details and programmatic requirements.   Immunization history:  Immunization History  Administered Date(s) Administered   HPV Quadrivalent 03/22/2007, 04/11/2009   Hepatitis A, Ped/Adol-2 Dose 03/22/2007, 04/11/2009   Hepatitis B, PED/ADOLESCENT 10/04/1994, 12/06/1994, 03/28/1995   MMR 10/27/1995, 04/26/2000   Meningococcal Conjugate 03/22/2007   Tdap 03/22/2007   Varicella 04/26/2000, 03/22/2007     The following portions of the patient's history were reviewed and updated as appropriate: allergies, current medications, past medical history, past social history, past surgical history and problem list.  Objective:  There were no vitals filed for this visit.  PE deferred.  Assessment and Plan:  Nilda Dollie Bressi is a 26 y.o. female presenting to the [facility] Department for treatment of Syphilis.    Syphilis -RPR 1:16 on 07/08/22 through [employer] .  Patient's is asymptomatic and last RPR results were in 2021.  Patient will need Bicillin 2.4 million units x 3 doses. Today is dose 2/3. Next dose in 1 week.  Advised patient not to have sex until 14 days after the completion of third dose.   Patient will need RPR follow up every 6 months.    - penicillin g benzathine (BICILLIN LA) 1200000 UNIT/2ML  injection 2.4 Million Units  RTC in 1 week for third dose of Bicillin.   Verbal order given for Bicillin by Dr. Lauretta Chester.   Sharlet Salina, [place]

## 2022-07-21 NOTE — Progress Notes (Unsigned)
Pt appointment for 2nd treatment for Syphilis. Seen by FNP Sydnee Levans. Bicillin administered. Pt tolerated well. Ambulated in hallway post injection for 15 minutes with no adverse effect. Pt reported wanting a pregnancy in the next year. Preparing for pregnancy pamphlet provided and education provided. Prenatal vitamins encouraged.

## 2022-07-22 ENCOUNTER — Telehealth: Payer: Self-pay | Admitting: Family Medicine

## 2022-07-22 NOTE — Telephone Encounter (Signed)
[facility] called with questions wondering why we faxed them for the pt Julie Mcclain the number is [phone]

## 2022-07-22 NOTE — Telephone Encounter (Signed)
RN called [facility] to determine when the fax was sent and what it contained. They reported receiving a fax with the encounter details of the pt's last visit at [phone] today. No providers or nurses in this clinic accessed the pt chart today or sent any faxes. Supervisor notified. [facility] agreed to [employer].

## 2022-07-26 DIAGNOSIS — Z1152 Encounter for screening for COVID-19: Secondary | ICD-10-CM | POA: Diagnosis not present

## 2022-07-26 DIAGNOSIS — R112 Nausea with vomiting, unspecified: Secondary | ICD-10-CM | POA: Insufficient documentation

## 2022-07-26 DIAGNOSIS — D500 Iron deficiency anemia secondary to blood loss (chronic): Secondary | ICD-10-CM | POA: Insufficient documentation

## 2022-07-26 DIAGNOSIS — N3090 Cystitis, unspecified without hematuria: Secondary | ICD-10-CM | POA: Diagnosis not present

## 2022-07-26 DIAGNOSIS — K2970 Gastritis, unspecified, without bleeding: Secondary | ICD-10-CM | POA: Diagnosis not present

## 2022-07-26 DIAGNOSIS — R8290 Unspecified abnormal findings in urine: Secondary | ICD-10-CM | POA: Diagnosis not present

## 2022-07-26 DIAGNOSIS — Z79899 Other long term (current) drug therapy: Secondary | ICD-10-CM | POA: Insufficient documentation

## 2022-07-26 DIAGNOSIS — R1013 Epigastric pain: Secondary | ICD-10-CM | POA: Diagnosis present

## 2022-07-26 LAB — CBC
HCT: 24.5 % — ABNORMAL LOW (ref 36.0–46.0)
Hemoglobin: 6 g/dL — ABNORMAL LOW (ref 12.0–15.0)
MCH: 16 pg — ABNORMAL LOW (ref 26.0–34.0)
MCHC: 24.5 g/dL — ABNORMAL LOW (ref 30.0–36.0)
MCV: 65.5 fL — ABNORMAL LOW (ref 80.0–100.0)
Platelets: 361 10*3/uL (ref 150–400)
RBC: 3.74 MIL/uL — ABNORMAL LOW (ref 3.87–5.11)
RDW: 21.2 % — ABNORMAL HIGH (ref 11.5–15.5)
WBC: 10.1 10*3/uL (ref 4.0–10.5)
nRBC: 0 % (ref 0.0–0.2)

## 2022-07-26 LAB — URINALYSIS, ROUTINE W REFLEX MICROSCOPIC
Bacteria, UA: NONE SEEN
Bilirubin Urine: NEGATIVE
Glucose, UA: NEGATIVE mg/dL
Hgb urine dipstick: NEGATIVE
Ketones, ur: NEGATIVE mg/dL
Nitrite: NEGATIVE
Protein, ur: 30 mg/dL — AB
Specific Gravity, Urine: 1.024 (ref 1.005–1.030)
WBC, UA: >50 WBC/hpf — ABNORMAL HIGH (ref 0–5)
pH: 6 (ref 5.0–8.0)

## 2022-07-26 LAB — COMPREHENSIVE METABOLIC PANEL
ALT: 10 U/L (ref 0–44)
AST: 17 U/L (ref 15–41)
Albumin: 3.6 g/dL (ref 3.5–5.0)
Alkaline Phosphatase: 64 U/L (ref 38–126)
Anion gap: 4 — ABNORMAL LOW (ref 5–15)
BUN: 11 mg/dL (ref 6–20)
CO2: 28 mmol/L (ref 22–32)
Calcium: 8.8 mg/dL — ABNORMAL LOW (ref 8.9–10.3)
Chloride: 107 mmol/L (ref 98–111)
Creatinine, Ser: 0.94 mg/dL (ref 0.44–1.00)
GFR, Estimated: >60 mL/min (ref >60)
Glucose, Bld: 85 mg/dL (ref 70–99)
Potassium: 3.3 mmol/L — ABNORMAL LOW (ref 3.5–5.1)
Sodium: 139 mmol/L (ref 135–145)
Total Bilirubin: 0.6 mg/dL (ref 0.3–1.2)
Total Protein: 7.8 g/dL (ref 6.5–8.1)

## 2022-07-26 LAB — LIPASE, BLOOD: Lipase: 30 U/L (ref 11–51)

## 2022-07-26 LAB — POC URINE PREG, ED: Preg Test, Ur: NEGATIVE

## 2022-07-26 NOTE — ED Triage Notes (Signed)
Pt sts that she has been having abd cramping. Pt sts that she recently had a out pt CT but has not heard the results of it. Pt denies N/V/D, fevers, chills.

## 2022-07-27 ENCOUNTER — Observation Stay
Admission: EM | Admit: 2022-07-27 | Discharge: 2022-07-27 | Disposition: A | Discharge status: Home / Self Care | Payer: Medicaid Other | Attending: Hospitalist | Admitting: Hospitalist

## 2022-07-27 DIAGNOSIS — N3090 Cystitis, unspecified without hematuria: Secondary | ICD-10-CM

## 2022-07-27 DIAGNOSIS — N921 Excessive and frequent menstruation with irregular cycle: Secondary | ICD-10-CM | POA: Diagnosis present

## 2022-07-27 DIAGNOSIS — D508 Other iron deficiency anemias: Secondary | ICD-10-CM | POA: Diagnosis not present

## 2022-07-27 DIAGNOSIS — D649 Anemia, unspecified: Secondary | ICD-10-CM | POA: Insufficient documentation

## 2022-07-27 DIAGNOSIS — D500 Iron deficiency anemia secondary to blood loss (chronic): Secondary | ICD-10-CM

## 2022-07-27 DIAGNOSIS — R531 Weakness: Secondary | ICD-10-CM

## 2022-07-27 DIAGNOSIS — R112 Nausea with vomiting, unspecified: Secondary | ICD-10-CM

## 2022-07-27 DIAGNOSIS — K2970 Gastritis, unspecified, without bleeding: Secondary | ICD-10-CM | POA: Diagnosis present

## 2022-07-27 DIAGNOSIS — R8290 Unspecified abnormal findings in urine: Secondary | ICD-10-CM | POA: Insufficient documentation

## 2022-07-27 DIAGNOSIS — E66813 Obesity, class 3: Secondary | ICD-10-CM | POA: Insufficient documentation

## 2022-07-27 DIAGNOSIS — F319 Bipolar disorder, unspecified: Secondary | ICD-10-CM | POA: Diagnosis present

## 2022-07-27 HISTORY — DX: Anemia, unspecified: D64.9

## 2022-07-27 HISTORY — DX: Unspecified abnormal findings in urine: R82.90

## 2022-07-27 LAB — IRON AND TIBC
Iron: 18 ug/dL — ABNORMAL LOW (ref 28–170)
Saturation Ratios: 4 % — ABNORMAL LOW (ref 10.4–31.8)
TIBC: 486 ug/dL — ABNORMAL HIGH (ref 250–450)
UIBC: 468 ug/dL

## 2022-07-27 LAB — RESP PANEL BY RT-PCR (FLU A&B, COVID) ARPGX2
Influenza A by PCR: NEGATIVE
Influenza B by PCR: NEGATIVE
SARS Coronavirus 2 by RT PCR: NEGATIVE

## 2022-07-27 LAB — FOLATE: Folate: 7.4 ng/mL (ref >5.9)

## 2022-07-27 LAB — RETICULOCYTES
Immature Retic Fract: 13.9 % (ref 2.3–15.9)
RBC.: 3.52 MIL/uL — ABNORMAL LOW (ref 3.87–5.11)
Retic Count, Absolute: 54.6 10*3/uL (ref 19.0–186.0)
Retic Ct Pct: 1.6 % (ref 0.4–3.1)

## 2022-07-27 LAB — FERRITIN: Ferritin: 4 ng/mL — ABNORMAL LOW (ref 11–307)

## 2022-07-27 LAB — VITAMIN B12: Vitamin B-12: 325 pg/mL (ref 180–914)

## 2022-07-27 LAB — PREPARE RBC (CROSSMATCH)

## 2022-07-27 LAB — HEMOGLOBIN: Hemoglobin: 8.1 g/dL — ABNORMAL LOW (ref 12.0–15.0)

## 2022-07-27 MED ORDER — ONDANSETRON HCL 4 MG/2ML IJ SOLN: 4.0000 mg | Freq: Four times a day (QID) | INTRAMUSCULAR | Status: DC | PRN

## 2022-07-27 MED ORDER — PANTOPRAZOLE SODIUM 40 MG IV SOLR
40.0000 mg | INTRAVENOUS | Status: DC
  Administered 2022-07-27: 40 mg via INTRAVENOUS
  Filled 2022-07-27: qty 10

## 2022-07-27 MED ORDER — ACETAMINOPHEN 325 MG PO TABS: 650.0000 mg | ORAL_TABLET | Freq: Four times a day (QID) | ORAL | Status: DC | PRN

## 2022-07-27 MED ORDER — ACETAMINOPHEN 325 MG RE SUPP: 650.0000 mg | Freq: Four times a day (QID) | RECTAL | Status: DC | PRN

## 2022-07-27 MED ORDER — ARIPIPRAZOLE 5 MG PO TABS: 5.0000 mg | ORAL_TABLET | Freq: Every day | ORAL | Status: DC

## 2022-07-27 MED ORDER — HYDROCODONE-ACETAMINOPHEN 5-325 MG PO TABS: 1.0000 | ORAL_TABLET | ORAL | Status: DC | PRN

## 2022-07-27 MED ORDER — ONDANSETRON HCL 4 MG PO TABS: 4.0000 mg | ORAL_TABLET | Freq: Four times a day (QID) | ORAL | Status: DC | PRN

## 2022-07-27 MED ORDER — SODIUM CHLORIDE 0.9 % IV SOLN
10.0000 mL/h | Freq: Once | INTRAVENOUS | Status: AC
  Administered 2022-07-27: 10 mL/h via INTRAVENOUS

## 2022-07-27 MED ORDER — SODIUM CHLORIDE 0.9 % IV SOLN
2.0000 g | Freq: Once | INTRAVENOUS | Status: AC
  Administered 2022-07-27: 2 g via INTRAVENOUS
  Filled 2022-07-27: qty 20

## 2022-07-27 MED ORDER — BUSPIRONE HCL 5 MG PO TABS: 10.0000 mg | ORAL_TABLET | Freq: Two times a day (BID) | ORAL | Status: DC

## 2022-07-27 MED ORDER — SODIUM CHLORIDE 0.9% IV SOLUTION
Freq: Once | INTRAVENOUS | Status: DC
  Filled 2022-07-27: qty 250

## 2022-07-27 MED ORDER — BUPROPION HCL ER (XL) 150 MG PO TB24: 300.0000 mg | ORAL_TABLET | Freq: Every day | ORAL | Status: DC

## 2022-07-27 MED ORDER — MORPHINE SULFATE (PF) 2 MG/ML IV SOLN: 2.0000 mg | INTRAVENOUS | Status: DC | PRN

## 2022-07-27 NOTE — Discharge Summary (Signed)
Physician Discharge Summary   Julie Mcclain  female DOB: 01-31-1996  ACZ:[id number]  PCP: Pcp, No  Admit date: 07/27/2022 Discharge date: 07/27/2022  Admitted From: home Disposition:  home CODE STATUS: Full code   Hospital Course:  For full details, please see H&P, progress notes, consult notes and ancillary notes.  Briefly,  Julie Mcclain is a 26 y.o. female with medical history significant for Menometrorrhagia with iron deficiency anemia, bipolar 1, who presented to the ED with a 54-month history of epigastric pain that worsened over the past few days.    The pain is severe and worse with food ingestion.  denies black or bloody stool.  She had an outpatient CAT scan done earlier in the day but due to persistent pain she presented to the ED to be evaluated.    Iron def anemia 2/2 chronic blood loss --Hgb 6.0 on presentation, which went up to 8.1 after 2u pRBC. --anemia workup showed iron def.  Vit B12 and folate wnl.  Pt does take oral iron supplement at home. --anemia can be due to menometrorrhagia and/or GI bleed.  GI consulted, and recommended inpatient EGD and colonoscopy, however, pt declined and wanted to be discharged as soon as possible. --Pt was advised to follow up with GI Dr. Tobi Bastos as outpatient.   Abdominal pain --gastritis suspected, however, pt didn't want inpatient GI workup.   --cont outpatient f/u   Abnormal urinalysis Patient got a dose of Rocephin in the ED.  No complaint of urinary symptoms, so abx not continued.   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 What changed: Another medication with the same name was removed. Continue taking this medication, and follow the directions you see here.   buPROPion 300 MG 24 hr tablet Commonly known as: WELLBUTRIN XL Take 1 tablet by mouth daily.   busPIRone 10 MG tablet Commonly known as: BUSPAR Take 1 tablet by mouth 2 (two) times daily.   FeroSul 325 (65 FE) MG tablet Generic drug: ferrous sulfate Take 325 mg by mouth daily with breakfast.   Vitamin D (Ergocalciferol) 1.25 MG (50000 UNIT) Caps capsule Commonly known as: DRISDOL Take 50,000 Units by mouth every 7 (seven) days.         Follow-up Information     Wyline Mood, MD Follow up in 1 week(s).   Specialty: Gastroenterology Contact information: [address] [place] [address] [phone]                 No Known Allergies   The results of significant diagnostics from this hospitalization (including imaging, microbiology, ancillary and laboratory) are listed below for reference.   Consultations:   Procedures/Studies: No results found.    Labs: BNP (last 3 results) No results for input(s): "BNP" in the last 8760 hours. Basic Metabolic Panel: Recent Labs  Lab 07/26/22 2233  NA 139  K 3.3*  CL 107  CO2 28  GLUCOSE 85  BUN 11  CREATININE 0.94  CALCIUM 8.8*   Liver Function Tests: Recent Labs  Lab 07/26/22 2233  AST 17  ALT 10  ALKPHOS 64  BILITOT 0.6  PROT 7.8  ALBUMIN 3.6   Recent Labs  Lab 07/26/22 2233  LIPASE 30   No results for input(s): "AMMONIA" in the last 168 hours. CBC: Recent Labs  Lab 07/26/22 2233  WBC 10.1  HGB 6.0*  HCT 24.5*  MCV 65.5*  PLT 361   Cardiac Enzymes: No results for input(s): "CKTOTAL", "CKMB",  "CKMBINDEX", "TROPONINI" in the last 168 hours. BNP: Invalid input(s): "POCBNP" CBG: No results for input(s): "GLUCAP" in the last 168 hours. D-Dimer No results for input(s): "DDIMER" in the last 72 hours. Hgb A1c No results for input(s): "HGBA1C" in the last 72 hours. Lipid Profile No results for input(s): "CHOL", "HDL", "LDLCALC", "TRIG", "CHOLHDL", "LDLDIRECT" in the last 72 hours. Thyroid function studies No results for input(s): "TSH", "T4TOTAL", "T3FREE", "THYROIDAB" in the last 72 hours.  Invalid input(s): "FREET3" Anemia work up Recent Labs    07/27/22 0325  VITAMINB12 325  FOLATE 7.4  FERRITIN 4*  TIBC 486*  IRON 18*  RETICCTPCT 1.6   Urinalysis    Component Value Date/Time   COLORURINE YELLOW (A) 07/26/2022 2233   APPEARANCEUR CLOUDY (A) 07/26/2022 2233   LABSPEC 1.024 07/26/2022 2233   PHURINE 6.0 07/26/2022 2233   GLUCOSEU NEGATIVE 07/26/2022 2233   HGBUR NEGATIVE 07/26/2022 2233   BILIRUBINUR NEGATIVE 07/26/2022 2233   BILIRUBINUR neg 07/30/2021 1458   KETONESUR NEGATIVE 07/26/2022 2233   PROTEINUR 30 (A) 07/26/2022 2233   NITRITE NEGATIVE 07/26/2022 2233   LEUKOCYTESUR LARGE (A) 07/26/2022 2233   Sepsis Labs Recent Labs  Lab 07/26/22 2233  WBC 10.1   Microbiology Recent Results (from the past 240 hour(s))  Resp Panel by RT-PCR (Flu A&B, Covid) Anterior Nasal Swab     Status: None   Collection Time: 07/27/22  5:30 AM   Specimen: Anterior Nasal Swab  Result Value Ref Range Status   SARS Coronavirus 2 by RT PCR NEGATIVE NEGATIVE Final    Comment: (NOTE) SARS-CoV-2 target nucleic acids are NOT DETECTED.  The SARS-CoV-2 RNA is generally detectable in upper respiratory specimens during the acute phase of infection. The lowest concentration of SARS-CoV-2 viral copies this assay can detect is 138 copies/mL. A negative result does not preclude SARS-Cov-2 infection and should not be used as the sole basis for treatment or other patient management  decisions. A negative result may occur with  improper specimen collection/handling, submission of specimen other than nasopharyngeal swab, presence of viral mutation(s) within the areas targeted by this assay, and inadequate number of viral copies(<138 copies/mL). A negative result must be combined with clinical observations, patient history, and epidemiological information. The expected result is Negative.  Fact Sheet for Patients:  BloggerCourse.com  Fact Sheet for Healthcare Providers:  SeriousBroker.it  This test is no t yet approved or cleared by the [place] FDA and  has been authorized for detection and/or diagnosis of SARS-CoV-2 by FDA under an Emergency Use Authorization (EUA). This EUA will remain  in effect (meaning this test can be used) for the duration of the COVID-19 declaration under Section 564(b)(1) of the Act, 21 U.S.C.section 360bbb-3(b)(1), unless the authorization is terminated  or revoked sooner.       Influenza A by PCR NEGATIVE NEGATIVE Final  Influenza B by PCR NEGATIVE NEGATIVE Final    Comment: (NOTE) The Xpert Xpress SARS-CoV-2/FLU/RSV plus assay is intended as an aid in the diagnosis of influenza from Nasopharyngeal swab specimens and should not be used as a sole basis for treatment. Nasal washings and aspirates are unacceptable for Xpert Xpress SARS-CoV-2/FLU/RSV testing.  Fact Sheet for Patients: BloggerCourse.com  Fact Sheet for Healthcare Providers: SeriousBroker.it  This test is not yet approved or cleared by the [place] FDA and has been authorized for detection and/or diagnosis of SARS-CoV-2 by FDA under an Emergency Use Authorization (EUA). This EUA will remain in effect (meaning this test can be used) for the duration of the COVID-19 declaration under Section 564(b)(1) of the Act, 21 U.S.C. section 360bbb-3(b)(1), unless the  authorization is terminated or revoked.  Performed at [facility], [address] [place], [place] [address]      Total time spend on discharging this patient, including the last patient exam, discussing the hospital stay, instructions for ongoing care as it relates to all pertinent caregivers, as well as preparing the medical discharge records, prescriptions, and/or referrals as applicable, is 45 minutes.    Darlin Priestly, MD  [facility] 07/27/2022, 1:09 PM

## 2022-07-27 NOTE — ED Notes (Signed)
Admitting provider at bedside.

## 2022-07-27 NOTE — Assessment & Plan Note (Signed)
Continue Abilify and Wellbutrin and BuSpar

## 2022-07-27 NOTE — Consult Note (Signed)
Wyline Mood , MD [address], [address], [place], [place], [address] [address], [place], [place], [address] Phone: [phone]  Fax: [phone]  Consultation  Referring Provider:     ER  Primary Care Physician:  Pcp, No Primary Gastroenterologist:  Dr. Timothy Lasso          Reason for Consultation:     Anemia   Date of Admission:  07/27/2022 Date of Consultation:  07/27/2022         HPI:   Julie Mcclain is a 26 y.o. female present to the emergency room with abdominal pain.  She has a history of iron deficiency anemia menometrorrhagia the abdominal pain has been going on for weeks.  She apparently had a CT scan as an outpatient at has not been able to access and has not been reviewed.  The patient has been treated for a urinary tract infection.  She says the abdominal pain has been ongoing for a few months, on and off in the center of her abdomen not realted to meals, no clear aggrevating or relieving factors, presently has resolved, no overt GI blood loss, no nasal bleeds, she denies any heavy period or prolonged periods.    07/26/2022 hemoglobin 6 g 9 months back was 5.3 g MCV 65.5Urine analysis shows no blood B12 in process folate normal ferritin of 4 Reviewing her labs she has had an anemia for at least 4 years and worse over the past 1 year.  10/06/2021 CT abdomen shows 4.9 cm smooth marginated fluid density lesion in the left adnexa suggesting possible functional ovarian cysts.   Past Medical History:  Diagnosis Date   IDA (iron deficiency anemia)    Menometrorrhagia    Obesity    Ovarian cyst    Snoring     Past Surgical History:  Procedure Laterality Date   denies      Prior to Admission medications   Medication Sig Start Date End Date Taking? Authorizing Provider  ARIPiprazole (ABILIFY) 5 MG tablet Take 5 mg by mouth at bedtime. 05/07/22  Yes [provider]  buPROPion (WELLBUTRIN XL) 300 MG 24 hr tablet Take 1 tablet by mouth daily.  03/22/17  Yes [provider]  Vitamin D, Ergocalciferol, (DRISDOL) 1.25 MG (50000 UNIT) CAPS capsule Take 50,000 Units by mouth every 7 (seven) days. 05/07/22  Yes [provider]  ARIPiprazole (ABILIFY) 15 MG tablet Take 1 tablet by mouth daily. Patient not taking: Reported on 07/27/2022 03/22/17   [provider]  busPIRone (BUSPAR) 10 MG tablet Take 1 tablet by mouth 2 (two) times daily. 03/22/17   [provider]    Family History  Problem Relation Age of Onset   Diabetes Father        blind in one eye     Social History   Tobacco Use   Smoking status: Never    Passive exposure: Never   Smokeless tobacco: Never  Vaping Use   Vaping Use: Never used  Substance Use Topics   Alcohol use: No   Drug use: No    Allergies as of 07/26/2022   (No Known Allergies)    Review of Systems:    All systems reviewed and negative except where noted in HPI.   Physical Exam:  Vital signs in last 24 hours: Temp:  [97.8 F (36.6 C)-98.2 F (36.8 C)] 97.8 F (36.6 C) (11/14 0737) Pulse Rate:  [79-109] 89 (11/14 0737) Resp:  [15-21] 15 (11/14 0737) BP: (  105-139)/(60-93) 132/93 (11/14 0737) SpO2:  [94 %-100 %] 100 % (11/14 0809) Weight:  [122.5 kg] 122.5 kg (11/13 2231)   General:   Pleasant, cooperative in NAD Head:  Normocephalic and atraumatic. Eyes:   No icterus.   Conjunctiva pink. PERRLA. Ears:  Normal auditory acuity. Neck:  Supple; no masses or thyroidomegaly Lungs: Respirations even and unlabored. Lungs clear to auscultation bilaterally.   No wheezes, crackles, or rhonchi.  Heart:  Regular rate and rhythm;  Without murmur, clicks, rubs or gallops Abdomen:  Soft, nondistended, nontender. Normal bowel sounds. No appreciable masses or hepatomegaly.  No rebound or guarding.  Neurologic:  Alert and oriented x3;  grossly normal neurologically. Skin:  Intact without significant lesions or rashes. Cervical Nodes:  No significant cervical  adenopathy. Psych:  Alert and cooperative. Normal affect.  LAB RESULTS: Recent Labs    07/26/22 2233  WBC 10.1  HGB 6.0*  HCT 24.5*  PLT 361   BMET Recent Labs    07/26/22 2233  NA 139  K 3.3*  CL 107  CO2 28  GLUCOSE 85  BUN 11  CREATININE 0.94  CALCIUM 8.8*   LFT Recent Labs    07/26/22 2233  PROT 7.8  ALBUMIN 3.6  AST 17  ALT 10  ALKPHOS 64  BILITOT 0.6   PT/INR No results for input(s): "LABPROT", "INR" in the last 72 hours.  STUDIES: No results found.    Impression / Plan:   Julie Mcclain is a 26 y.o. y/o female presents to the emergency room with severe iron deficiency anemia, abdominal pain and being treated for urine tract infection.  History of menometrorrhagia( presently denies any heavy periods)  Plan 1.  Follow-up B12 levels, IV iron, H. pylori stool antigen 2.  Does not require stool occult test as she has iron deficiency anemia and a positive or negative stool test will not change evaluation 3. Please obtain CT scan from yesterday  4. I recommended EGD+colonoscopy to evaluate the severe anemia- she is not interested and saidf she will discuss with her mother. Advised her to update me on the same   Thank you for involving me in the care of this patient.      LOS: 0 days   Wyline Mood, MD  07/27/2022, 8:32 AM

## 2022-07-27 NOTE — Assessment & Plan Note (Addendum)
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 Suspect gastritis History of iron deficiency anemia History of menometrorrhagia Reports food-related epigastric pain consistent with gastritis Continue transfusion of 1 unit PRBCs started in the ED IV Protonix GI consult for further evaluation and consideration of EGD Follow-up CT abdomen done as outpatient.  Currently unavailable in Care Everywhere

## 2022-07-27 NOTE — ED Notes (Signed)
Pt. Provided graham crackers as requested.

## 2022-07-27 NOTE — ED Provider Notes (Signed)
[facility] Provider Note    Event Date/Time   First MD Initiated Contact with Patient 07/27/22 [id number]     (approximate)   History   Abdominal Pain   HPI  Julie Mcclain is a 26 y.o. female  with h/o IDA, menometrorrhagia, bipolar d/o, depression, anxiety, here with multiple complaints. Primary complaint is intermittent aching, gnawing epigastric abd discomfort that has been ongoing for "weeks" along with generalized weakness. She states she had a CT scan done at [facility] today but was having ongoing pain so came in. She is not sure what it showed. Reports she has had gnawing pain that is worse w/ eating, worse at night, some chills. No alleviating factors. She has felt generally tired and fatigued. No fevers.       Physical Exam   Triage Vital Signs: ED Triage Vitals  Enc Vitals Group     BP 07/26/22 2228 139/65     Pulse Rate 07/26/22 2228 (!) 109     Resp 07/26/22 2228 19     Temp 07/26/22 2228 98.2 F (36.8 C)     Temp Source 07/26/22 2228 Oral     SpO2 07/26/22 2228 99 %     Weight 07/26/22 2231 270 lb (122.5 kg)     Height --      Head Circumference --      Peak Flow --      Pain Score 07/26/22 2231 7     Pain Loc --      Pain Edu? --      Excl. in GC? --     Most recent vital signs: Vitals:   07/26/22 2228  BP: 139/65  Pulse: (!) 109  Resp: 19  Temp: 98.2 F (36.8 C)  SpO2: 99%     General: Awake, no distress.  CV:  Good peripheral perfusion. RRR. No m/rg. Resp:  Normal effort. Normal WOB. Lungs clear bilaterally. Abd:  No distention. Minimal epigastric discomfort, no rebound or guarding. Other:  No LE edema.   ED Results / Procedures / Treatments   Labs (all labs ordered are listed, but only abnormal results are displayed) Labs Reviewed  COMPREHENSIVE METABOLIC PANEL - Abnormal; Notable for the following components:      Result Value   Potassium 3.3 (*)    Calcium 8.8 (*)    Anion gap 4 (*)    All other  components within normal limits  CBC - Abnormal; Notable for the following components:   RBC 3.74 (*)    Hemoglobin 6.0 (*)    HCT 24.5 (*)    MCV 65.5 (*)    MCH 16.0 (*)    MCHC 24.5 (*)    RDW 21.2 (*)    All other components within normal limits  URINALYSIS, ROUTINE W REFLEX MICROSCOPIC - Abnormal; Notable for the following components:   Color, Urine YELLOW (*)    APPearance CLOUDY (*)    Protein, ur 30 (*)    Leukocytes,Ua LARGE (*)    WBC, UA >50 (*)    All other components within normal limits  URINE CULTURE  SARS CORONAVIRUS 2 BY RT PCR  LIPASE, BLOOD  VITAMIN B12  FOLATE  IRON AND TIBC  FERRITIN  RETICULOCYTES  OCCULT BLOOD X 1 CARD TO LAB, STOOL  POC URINE PREG, ED  PREPARE RBC (CROSSMATCH)  TYPE AND SCREEN     EKG    RADIOLOGY    I also independently reviewed and agree with radiologist interpretations.  PROCEDURES:  Critical Care performed: Yes, see critical care procedure note(s)  .Critical Care  Performed by: Shaune Pollack, MD Authorized by: Shaune Pollack, MD   Critical care provider statement:    Critical care time (minutes):  30   Critical care time was exclusive of:  Separately billable procedures and treating other patients   Critical care was necessary to treat or prevent imminent or life-threatening deterioration of the following conditions:  Cardiac failure, circulatory failure and respiratory failure   Critical care was time spent personally by me on the following activities:  Development of treatment plan with patient or surrogate, discussions with consultants, evaluation of patient's response to treatment, examination of patient, ordering and review of laboratory studies, ordering and review of radiographic studies, ordering and performing treatments and interventions, pulse oximetry, re-evaluation of patient's condition and review of old charts     MEDICATIONS ORDERED IN ED: Medications  0.9 %  sodium chloride infusion (has no  administration in time range)  cefTRIAXone (ROCEPHIN) 2 g in sodium chloride 0.9 % 100 mL IVPB (has no administration in time range)     IMPRESSION / MDM / ASSESSMENT AND PLAN / ED COURSE  I reviewed the triage vital signs and the nursing notes.                               This patient presents to the ED for concern of weakness, abdominal pain, this involves an extensive number of treatment options, and is a complaint that carries with it a high risk of complications and morbidity.  The differential diagnosis includes: PUD, gastritis, gastroparesis, IBS, enteritis, colitis, anemia, cholecystitis.   Co morbidities that complicate the patient evaluation  Obesity Bipolar disorder   Additional history obtained:  Additional history obtained from NA External records from outside source obtained and reviewed including Visit with FNP on 11/1 and 11/8 for syphilis treatment   Lab Tests:  I Ordered, and personally interpreted labs.  The pertinent results include:   CMP with K 3.3, Ca 8.8, normal renal function CBC with Hgb 6.0, microcytic, WBC 10.1 UA + hematuria, pyuria Anemia panel pending   Cardiac Monitoring: / EKG:  The patient was maintained on a cardiac monitor.  I personally viewed and interpreted the cardiac monitored which showed an underlying rhythm of: NSR   Problem List / ED Course / Critical interventions / Medication management  Nausea/vomiting/abd pain Pt reports intermittent chronic epigastric abd pain, ddx includes gastritis, GERD, gastroparesis. Abdomen is soft, NT, and ND. Unable to access CT scan from [facility] at this time but abd soft, no signs of emergent pathology. Of note, pt Hgb 6.0 and she endorses intermittent black stools, though hemoccult negative here. Will place on PPI, transfuse, admit for obs and trending Hgb with possible GI consult if indicated. Anemia Likely acute on chronic IDA, anemia panel sent No signs of active bleeding but pt does  endorse + melena, though hemoccult negative Not on period but does have periods that last "weeks" UTI +urinary sx, will treat No flank pain, n/v, or signs of pyelo S/p recent tx for syphilis  I ordered medication including Protonix  for possible UGIB/gastritis  Reevaluation of the patient after these medicines showed that the patient stayed the same I have reviewed the patients home medicines and have made adjustments as needed   Social Determinants of Health:  Bipolar disorder, poor health literacy   Test / Admission - Considered:  Admit to Hospitalist   FINAL CLINICAL IMPRESSION(S) / ED DIAGNOSES   Final diagnoses:  Acute on chronic anemia  Weakness  Nausea and vomiting, unspecified vomiting type  Cystitis     Rx / DC Orders   ED Discharge Orders     None        Note:  This document was prepared using Dragon voice recognition software and may include unintentional dictation errors.   Shaune Pollack, MD 07/27/22 [phone]

## 2022-07-27 NOTE — H&P (Addendum)
History and Physical    Patient: Julie Mcclain GUR:[id number] DOB: 16-Mar-1996 DOA: 07/27/2022 DOS: the patient was seen and examined on 07/27/2022 PCP: Pcp, No  Patient coming from: Home  Chief Complaint:  Chief Complaint  Patient presents with   Abdominal Pain    HPI: Julie Mcclain is a 26 y.o. female with medical history significant for Menometrorrhagia with iron deficiency anemia, bipolar 1, who presents to the ED with a 108-month history of epigastric pain that worsened over the past few days.  The pain is severe and worse with food ingestion.  She has nausea without vomiting and denies black or bloody stool.  She denies dysuria, diarrhea, fever or chills.  Denies palpitations or shortness of breath but endorses generalized weakness.  She had an outpatient CAT scan done earlier in the day but due to persistent pain she presented to the ED to be evaluated. ED course and data review: Tachycardic to 109 with otherwise normal vitals.6  Labs significant for hemoglobin of, down from 8.8 8 months prior.  For potassium 3.3 urinalysis with large leukocyte esterase. Patient was started on unit of PRBCs.  She was given a gram of Rocephin for possible UTI.  Hospitalist consulted for admission.     Review of Systems: As mentioned in the history of present illness. All other systems reviewed and are negative.  Past Medical History:  Diagnosis Date   IDA (iron deficiency anemia)    Menometrorrhagia    Obesity    Ovarian cyst    Snoring    Past Surgical History:  Procedure Laterality Date   denies     Social History:  reports that she has never smoked. She has never been exposed to tobacco smoke. She has never used smokeless tobacco. She reports that she does not drink alcohol and does not use drugs.  No Known Allergies  Family History  Problem Relation Age of Onset   Diabetes Father        blind in one eye    Prior to Admission medications   Medication Sig Start Date  End Date Taking? Authorizing Provider  ARIPiprazole (ABILIFY) 5 MG tablet Take 5 mg by mouth at bedtime. 05/07/22  Yes [provider]  Vitamin D, Ergocalciferol, (DRISDOL) 1.25 MG (50000 UNIT) CAPS capsule Take 50,000 Units by mouth every 7 (seven) days. 05/07/22  Yes [provider]  ARIPiprazole (ABILIFY) 15 MG tablet Take 1 tablet by mouth daily. 03/22/17   [provider]  buPROPion (WELLBUTRIN XL) 300 MG 24 hr tablet Take 1 tablet by mouth daily. 03/22/17   [provider]  busPIRone (BUSPAR) 10 MG tablet Take 1 tablet by mouth 2 (two) times daily. 03/22/17   [provider]    Physical Exam: Vitals:   07/26/22 2228 07/26/22 2231 07/27/22 0358  BP: 139/65  112/60  Pulse: (!) 109  87  Resp: 19  18  Temp: 98.2 F (36.8 C)  98.1 F (36.7 C)  TempSrc: Oral  Oral  SpO2: 99%  99%  Weight:  122.5 kg    Physical Exam Vitals and nursing note reviewed.  Constitutional:      General: She is not in acute distress. HENT:     Head: Normocephalic and atraumatic.  Cardiovascular:     Rate and Rhythm: Regular rhythm. Tachycardia present.     Heart sounds: Normal heart sounds.  Pulmonary:     Effort: Pulmonary effort is normal.     Breath sounds: Normal breath sounds.  Abdominal:     Palpations: Abdomen is soft.     Tenderness: There is no abdominal tenderness.  Neurological:     Mental Status: Mental status is at baseline. She is lethargic.     Labs on Admission: I have personally reviewed following labs and imaging studies  CBC: Recent Labs  Lab 07/26/22 2233  WBC 10.1  HGB 6.0*  HCT 24.5*  MCV 65.5*  PLT 361   Basic Metabolic Panel: Recent Labs  Lab 07/26/22 2233  NA 139  K 3.3*  CL 107  CO2 28  GLUCOSE 85  BUN 11  CREATININE 0.94  CALCIUM 8.8*   GFR: CrCl cannot be calculated (Unknown ideal weight.). Liver Function Tests: Recent Labs  Lab 07/26/22 2233  AST 17  ALT 10  ALKPHOS 64  BILITOT 0.6  PROT 7.8   ALBUMIN 3.6   Recent Labs  Lab 07/26/22 2233  LIPASE 30   No results for input(s): "AMMONIA" in the last 168 hours. Coagulation Profile: No results for input(s): "INR", "PROTIME" in the last 168 hours. Cardiac Enzymes: No results for input(s): "CKTOTAL", "CKMB", "CKMBINDEX", "TROPONINI" in the last 168 hours. BNP (last 3 results) No results for input(s): "PROBNP" in the last 8760 hours. HbA1C: No results for input(s): "HGBA1C" in the last 72 hours. CBG: No results for input(s): "GLUCAP" in the last 168 hours. Lipid Profile: No results for input(s): "CHOL", "HDL", "LDLCALC", "TRIG", "CHOLHDL", "LDLDIRECT" in the last 72 hours. Thyroid Function Tests: No results for input(s): "TSH", "T4TOTAL", "FREET4", "T3FREE", "THYROIDAB" in the last 72 hours. Anemia Panel: Recent Labs    07/27/22 0325  RETICCTPCT 1.6   Urine analysis:    Component Value Date/Time   COLORURINE YELLOW (A) 07/26/2022 2233   APPEARANCEUR CLOUDY (A) 07/26/2022 2233   LABSPEC 1.024 07/26/2022 2233   PHURINE 6.0 07/26/2022 2233   GLUCOSEU NEGATIVE 07/26/2022 2233   HGBUR NEGATIVE 07/26/2022 2233   BILIRUBINUR NEGATIVE 07/26/2022 2233   BILIRUBINUR neg 07/30/2021 1458   KETONESUR NEGATIVE 07/26/2022 2233   PROTEINUR 30 (A) 07/26/2022 2233   NITRITE NEGATIVE 07/26/2022 2233   LEUKOCYTESUR LARGE (A) 07/26/2022 2233    Radiological Exams on Admission: No results found.   Data Reviewed: Relevant notes from primary care and specialist visits, past discharge summaries as available in EHR, including Care Everywhere. Prior diagnostic testing as pertinent to current admission diagnoses Updated medications and problem lists for reconciliation ED course, including vitals, labs, imaging, treatment and response to treatment Triage notes, nursing and pharmacy notes and ED provider's notes Notable results as noted in HPI   Assessment and Plan: Symptomatic anemia 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 Suspect  gastritis History of iron deficiency anemia History of menometrorrhagia Reports food-related epigastric pain consistent with gastritis Continue transfusion of 1 unit PRBCs started in the ED IV Protonix GI consult for further evaluation and consideration of EGD Follow-up CT abdomen done as outpatient.  Currently unavailable in Care Everywhere   Abnormal urinalysis Patient got a dose of Rocephin in the ED Will hold off on further antibiotics pending culture  Obesity, Class III, BMI 40-49.9 (morbid obesity) (HCC) Potential prognostic complicating factor  Bipolar 1 disorder (HCC) Continue Abilify and Wellbutrin and BuSpar  Menometrorrhagia Patient follows with OB/GYN        DVT prophylaxis: SCD  Consults: GI Dr Servando Snare  Advance Care Planning:   Code Status: Prior   Family Communication: none  Disposition Plan: Back to previous home environment  Severity of Illness: The appropriate patient status  for this patient is OBSERVATION. Observation status is judged to be reasonable and necessary in order to provide the required intensity of service to ensure the patient's safety. The patient's presenting symptoms, physical exam findings, and initial radiographic and laboratory data in the context of their medical condition is felt to place them at decreased risk for further clinical deterioration. Furthermore, it is anticipated that the patient will be medically stable for discharge from the hospital within 2 midnights of admission.   Author: Andris Baumann, MD 07/27/2022 4:19 AM  For on call review [email or web address].

## 2022-07-27 NOTE — Assessment & Plan Note (Signed)
Patient follows with OB/GYN

## 2022-07-27 NOTE — ED Notes (Signed)
Pt. Unhooked from cardiac monitoring to get Up to toilet independently. Gait steady, NAD

## 2022-07-27 NOTE — Assessment & Plan Note (Signed)
Patient got a dose of Rocephin in the ED Will hold off on further antibiotics pending culture

## 2022-07-27 NOTE — Assessment & Plan Note (Signed)
Potential prognostic complicating factor

## 2022-07-27 NOTE — ED Notes (Signed)
Called dining service to request food tray.

## 2022-07-28 ENCOUNTER — Ambulatory Visit: Payer: Medicaid Other

## 2022-07-28 DIAGNOSIS — Z113 Encounter for screening for infections with a predominantly sexual mode of transmission: Secondary | ICD-10-CM

## 2022-07-28 DIAGNOSIS — A539 Syphilis, unspecified: Secondary | ICD-10-CM | POA: Diagnosis not present

## 2022-07-28 LAB — TYPE AND SCREEN
ABO/RH(D): O POS
Antibody Screen: NEGATIVE
Unit division: 0
Unit division: 0

## 2022-07-28 LAB — BPAM RBC
Blood Product Expiration Date: 202312192359
Blood Product Expiration Date: 202312192359
ISSUE DATE / TIME: 202311140448
ISSUE DATE / TIME: 202311140948
Unit Type and Rh: 5100
Unit Type and Rh: 5100

## 2022-07-28 LAB — URINE CULTURE

## 2022-07-28 NOTE — Progress Notes (Signed)
In nurse clinic for syphilis treatment / Bicillin #3. Voices no concerns from prior bicillin injections. Bicillin 2.4 mu given IM per order by Onalee Hua, FNP dated 07/14/2022. Tolerated well. Stayed for approx 15 min after injection without problem. Advised no sex for 14 days post tx and return RPR in 6 months (due 01/2023), has reminder. Questions answered and reports understanding. Jerel Shepherd, RN

## 2022-07-29 NOTE — Progress Notes (Signed)
07-29-2022 for 07-28-2022. Per review of patients EMR chart, Dr. Grace Isaac has been correctly removed and is no longer listed as PCP. Herby Abraham RN.

## 2022-07-29 NOTE — Progress Notes (Signed)
07-29-2022 for 07-28-2022.  07-22-2022 Per report of [facility] FNP. [facility] clinic nurse Julie Reining RN received a call from Julie Mcclain at [facility], located at [address], [place], [place] [address], stating their office received a fax containing details of patients 07-21-2022 [facility] visit. Per review of EPIC patient chart, at the conclusion of the 07-21-2022 visit, [facility] created and automatically faxed a letter to Dr. Tod Persia at [facility] as this physician was listed as the patient's PCP. Telephone call to Julie Mcclain at [facility]. Julie Mcclain stated she had shredded the faxed records and cover sheet. Julie Mcclain also stated she deleted the fax number from their office fax machine. Julie Mcclain verbalized no information was shared and will not be shared from this patient's faxed record. On 07-28-2022, patient Julie Mcclain into clinic. I discussed the above with patient. Per Milady, previously when she lived in [place], Dr. Donavan Foil was her primary care provider. Patient states her current primary care provider is Government social research officer located in [place], [place]. I advised patient I will make sure Dr. Donavan Foil is removed and no longer listed as her primary care provider. Patient agreed and verbalized no concerns. Herby Abraham RN.

## 2022-09-10 ENCOUNTER — Ambulatory Visit: Payer: Medicaid Other

## 2022-09-20 ENCOUNTER — Ambulatory Visit: Payer: Medicaid Other

## 2022-10-13 ENCOUNTER — Other Ambulatory Visit: Payer: Self-pay | Admitting: Family

## 2022-10-15 LAB — CBC WITH DIFFERENTIAL/PLATELET
Basophils Absolute: 0 10*3/uL (ref 0.0–0.2)
Basos: 0 %
EOS (ABSOLUTE): 0.3 10*3/uL (ref 0.0–0.4)
Eos: 3 %
Hematocrit: 35.2 % (ref 34.0–46.6)
Hemoglobin: 9.7 g/dL — ABNORMAL LOW (ref 11.1–15.9)
Immature Grans (Abs): 0 10*3/uL (ref 0.0–0.1)
Immature Granulocytes: 0 %
Lymphocytes Absolute: 2.1 10*3/uL (ref 0.7–3.1)
Lymphs: 22 %
MCH: 19.9 pg — ABNORMAL LOW (ref 26.6–33.0)
MCHC: 27.6 g/dL — ABNORMAL LOW (ref 31.5–35.7)
MCV: 72 fL — ABNORMAL LOW (ref 79–97)
Monocytes Absolute: 0.6 10*3/uL (ref 0.1–0.9)
Monocytes: 6 %
Neutrophils Absolute: 6.6 10*3/uL (ref 1.4–7.0)
Neutrophils: 69 %
Platelets: 415 10*3/uL (ref 150–450)
RBC: 4.87 x10E6/uL (ref 3.77–5.28)
RDW: 19.9 % — ABNORMAL HIGH (ref 11.7–15.4)
WBC: 9.7 10*3/uL (ref 3.4–10.8)

## 2022-10-15 LAB — LIPID PANEL W/O CHOL/HDL RATIO
Cholesterol, Total: 200 mg/dL — ABNORMAL HIGH (ref 100–199)
HDL: 57 mg/dL (ref >39)
LDL Chol Calc (NIH): 118 mg/dL — ABNORMAL HIGH (ref 0–99)
Triglycerides: 144 mg/dL (ref 0–149)
VLDL Cholesterol Cal: 25 mg/dL (ref 5–40)

## 2022-10-15 LAB — VITAMIN D 25 HYDROXY (VIT D DEFICIENCY, FRACTURES): Vit D, 25-Hydroxy: 15.1 ng/mL — ABNORMAL LOW (ref 30.0–100.0)

## 2022-10-15 LAB — COMPREHENSIVE METABOLIC PANEL
ALT: 11 IU/L (ref 0–32)
AST: 13 IU/L (ref 0–40)
Albumin/Globulin Ratio: 1.2 (ref 1.2–2.2)
Albumin: 4.2 g/dL (ref 4.0–5.0)
Alkaline Phosphatase: 84 IU/L (ref 44–121)
BUN/Creatinine Ratio: 10 (ref 9–23)
BUN: 9 mg/dL (ref 6–20)
Bilirubin Total: 0.3 mg/dL (ref 0.0–1.2)
CO2: 25 mmol/L (ref 20–29)
Calcium: 9 mg/dL (ref 8.7–10.2)
Chloride: 102 mmol/L (ref 96–106)
Creatinine, Ser: 0.86 mg/dL (ref 0.57–1.00)
Globulin, Total: 3.5 g/dL (ref 1.5–4.5)
Glucose: 96 mg/dL (ref 70–99)
Potassium: 4 mmol/L (ref 3.5–5.2)
Sodium: 137 mmol/L (ref 134–144)
Total Protein: 7.7 g/dL (ref 6.0–8.5)
eGFR: 94 mL/min/{1.73_m2} (ref >59)

## 2022-10-15 LAB — IRON,TIBC AND FERRITIN PANEL
Ferritin: 6 ng/mL — ABNORMAL LOW (ref 15–150)
Iron Saturation: 5 % — CL (ref 15–55)
Iron: 21 ug/dL — ABNORMAL LOW (ref 27–159)
Total Iron Binding Capacity: 457 ug/dL — ABNORMAL HIGH (ref 250–450)
UIBC: 436 ug/dL — ABNORMAL HIGH (ref 131–425)

## 2022-10-15 LAB — RPR: RPR Ser Ql: REACTIVE — AB

## 2022-10-15 LAB — HGB A1C W/O EAG: Hgb A1c MFr Bld: 5.3 % (ref 4.8–5.6)

## 2022-10-15 LAB — RPR, QUANT+TP ABS (REFLEX)
Rapid Plasma Reagin, Quant: 1:16 {titer} — ABNORMAL HIGH
T Pallidum Abs: REACTIVE — AB

## 2022-10-15 LAB — VITAMIN B12: Vitamin B-12: 591 pg/mL (ref 232–1245)

## 2022-10-15 LAB — HIV ANTIBODY (ROUTINE TESTING W REFLEX): HIV Screen 4th Generation wRfx: NONREACTIVE

## 2022-10-15 LAB — TSH: TSH: 1.37 u[IU]/mL (ref 0.450–4.500)

## 2022-11-30 ENCOUNTER — Encounter: Payer: Self-pay | Admitting: Family

## 2022-11-30 ENCOUNTER — Ambulatory Visit (INDEPENDENT_AMBULATORY_CARE_PROVIDER_SITE_OTHER): Payer: Medicaid Other | Admitting: Family

## 2022-11-30 VITALS — BP 122/70 | HR 107 | Ht 63.0 in | Wt 280.0 lb

## 2022-11-30 DIAGNOSIS — F319 Bipolar disorder, unspecified: Secondary | ICD-10-CM

## 2022-11-30 DIAGNOSIS — F339 Major depressive disorder, recurrent, unspecified: Secondary | ICD-10-CM | POA: Diagnosis not present

## 2022-11-30 DIAGNOSIS — F419 Anxiety disorder, unspecified: Secondary | ICD-10-CM

## 2022-11-30 DIAGNOSIS — E1165 Type 2 diabetes mellitus with hyperglycemia: Secondary | ICD-10-CM

## 2022-11-30 MED ORDER — VITAMIN D (ERGOCALCIFEROL) 1.25 MG (50000 UNIT) PO CAPS: 50000.0000 [IU] | ORAL_CAPSULE | ORAL | 3 refills | Status: DC

## 2022-11-30 MED ORDER — BUPROPION HCL ER (XL) 300 MG PO TB24: 300.0000 mg | ORAL_TABLET | Freq: Every day | ORAL | 1 refills | Status: DC

## 2022-11-30 MED ORDER — ARIPIPRAZOLE 5 MG PO TABS: 5.0000 mg | ORAL_TABLET | Freq: Every day | ORAL | 1 refills | Status: DC

## 2022-11-30 MED ORDER — BUSPIRONE HCL 10 MG PO TABS: 10.0000 mg | ORAL_TABLET | Freq: Two times a day (BID) | ORAL | 1 refills | Status: DC

## 2022-12-02 ENCOUNTER — Encounter: Payer: Self-pay | Admitting: Family

## 2022-12-02 NOTE — Progress Notes (Signed)
Established Patient Office Visit  Subjective:  Patient ID: Julie Mcclain, female    DOB: 12/11/1995  Age: 27 y.o. MRN: [id number]  Chief Complaint  Patient presents with   Follow-up    Follow up    Patient is here today for her 1 month follow up.  She has been feeling well since last appointment.   She does not have additional concerns to discuss today.  Labs are not due today. She needs refills.   I have reviewed her active problem list, medication list, allergies, notes from last encounter, lab results for her appointment today.  No other concerns at this time.   Past Medical History:  Diagnosis Date   IDA (iron deficiency anemia)    Menometrorrhagia    Obesity    Ovarian cyst    Snoring     Past Surgical History:  Procedure Laterality Date   denies      Social History   Socioeconomic History   Marital status: Single    Spouse name: Not on file   Number of children: 0   Years of education: 7   Highest education level: Not on file  Occupational History   Occupation: unemployed  Tobacco Use   Smoking status: Never    Passive exposure: Never   Smokeless tobacco: Never  Vaping Use   Vaping Use: Never used  Substance and Sexual Activity   Alcohol use: No   Drug use: No   Sexual activity: Yes    Birth control/protection: None  Other Topics Concern   Not on file  Social History Narrative   Not on file   Social Determinants of Health   Financial Resource Strain: Low Risk  (01/06/2022)   Overall Financial Resource Strain (CARDIA)    Difficulty of Paying Living Expenses: Not hard at all  Food Insecurity: Food Insecurity Present (01/06/2022)   Hunger Vital Sign    Worried About [place] in the Last Year: Sometimes true    Ran Out of Food in the Last Year: Sometimes true  Transportation Needs: Unmet Transportation Needs (01/06/2022)   PRAPARE - Hydrologist (Medical): Yes    Lack of Transportation  (Non-Medical): No  Physical Activity: Insufficiently Active (01/06/2022)   Exercise Vital Sign    Days of Exercise per Week: 1 day    Minutes of Exercise per Session: 30 min  Stress: No Stress Concern Present (01/06/2022)   [place]    Feeling of Stress : Only a little  Social Connections: Moderately Isolated (01/06/2022)   Social Connection and Isolation Panel [NHANES]    Frequency of Communication with Friends and Family: More than three times a week    Frequency of Social Gatherings with Friends and Family: More than three times a week    Attends Religious Services: More than 4 times per year    Active Member of [employer] or Organizations: No    Attends Archivist Meetings: Never    Marital Status: Never married  Intimate Partner Violence: Not At Risk (07/14/2022)   Humiliation, Afraid, Rape, and Kick questionnaire    Fear of Current or Ex-Partner: No    Emotionally Abused: No    Physically Abused: No    Sexually Abused: No    Family History  Problem Relation Age of Onset   Diabetes Father        blind in one eye    No Known  Allergies  Review of Systems  All other systems reviewed and are negative.      Objective:   BP 122/70   Pulse (!) 107   Ht 5\' 3"  (1.6 m)   Wt 280 lb (127 kg)   SpO2 98%   BMI 49.60 kg/m   Vitals:   11/30/22 1045  BP: 122/70  Pulse: (!) 107  Height: 5\' 3"  (1.6 m)  Weight: 280 lb (127 kg)  SpO2: 98%  BMI (Calculated): 49.61    Physical Exam Vitals and nursing note reviewed.  Constitutional:      Appearance: Normal appearance. She is normal weight.  HENT:     Head: Normocephalic.  Eyes:     Pupils: Pupils are equal, round, and reactive to light.  Cardiovascular:     Rate and Rhythm: Normal rate.  Pulmonary:     Effort: Pulmonary effort is normal.  Neurological:     Mental Status: She is alert.      No results found for any visits on  11/30/22.  Recent Results (from the past 2160 hour(s))  Iron, TIBC and Ferritin Panel     Status: Abnormal   Collection Time: 10/13/22 12:15 PM  Result Value Ref Range   Total Iron Binding Capacity 457 (H) 250 - 450 ug/dL   UIBC 436 (H) 131 - 425 ug/dL   Iron 21 (L) 27 - 159 ug/dL   Iron Saturation 5 (LL) 15 - 55 %   Ferritin 6 (L) 15 - 150 ng/mL  CBC with Differential/Platelet     Status: Abnormal   Collection Time: 10/13/22 12:15 PM  Result Value Ref Range   WBC 9.7 3.4 - 10.8 x10E3/uL   RBC 4.87 3.77 - 5.28 x10E6/uL   Hemoglobin 9.7 (L) 11.1 - 15.9 g/dL   Hematocrit 35.2 34.0 - 46.6 %   MCV 72 (L) 79 - 97 fL   MCH 19.9 (L) 26.6 - 33.0 pg   MCHC 27.6 (L) 31.5 - 35.7 g/dL   RDW 19.9 (H) 11.7 - 15.4 %   Platelets 415 150 - 450 x10E3/uL   Neutrophils 69 Not Estab. %   Lymphs 22 Not Estab. %   Monocytes 6 Not Estab. %   Eos 3 Not Estab. %   Basos 0 Not Estab. %   Neutrophils Absolute 6.6 1.4 - 7.0 x10E3/uL   Lymphocytes Absolute 2.1 0.7 - 3.1 x10E3/uL   Monocytes Absolute 0.6 0.1 - 0.9 x10E3/uL   EOS (ABSOLUTE) 0.3 0.0 - 0.4 x10E3/uL   Basophils Absolute 0.0 0.0 - 0.2 x10E3/uL   Immature Granulocytes 0 Not Estab. %   Immature Grans (Abs) 0.0 0.0 - 0.1 x10E3/uL  Comprehensive metabolic panel     Status: None   Collection Time: 10/13/22 12:15 PM  Result Value Ref Range   Glucose 96 70 - 99 mg/dL   BUN 9 6 - 20 mg/dL   Creatinine, Ser 0.86 0.57 - 1.00 mg/dL   eGFR 94 >59 mL/min/1.73   BUN/Creatinine Ratio 10 9 - 23   Sodium 137 134 - 144 mmol/L   Potassium 4.0 3.5 - 5.2 mmol/L   Chloride 102 96 - 106 mmol/L   CO2 25 20 - 29 mmol/L   Calcium 9.0 8.7 - 10.2 mg/dL   Total Protein 7.7 6.0 - 8.5 g/dL   Albumin 4.2 4.0 - 5.0 g/dL   Globulin, Total 3.5 1.5 - 4.5 g/dL   Albumin/Globulin Ratio 1.2 1.2 - 2.2   Bilirubin Total 0.3 0.0 - 1.2 mg/dL  Alkaline Phosphatase 84 44 - 121 IU/L   AST 13 0 - 40 IU/L   ALT 11 0 - 32 IU/L  Lipid Panel w/o Chol/HDL Ratio     Status:  Abnormal   Collection Time: 10/13/22 12:15 PM  Result Value Ref Range   Cholesterol, Total 200 (H) 100 - 199 mg/dL   Triglycerides 144 0 - 149 mg/dL   HDL 57 >39 mg/dL   VLDL Cholesterol Cal 25 5 - 40 mg/dL   LDL Chol Calc (NIH) 118 (H) 0 - 99 mg/dL  Hgb A1c w/o eAG     Status: None   Collection Time: 10/13/22 12:15 PM  Result Value Ref Range   Hgb A1c MFr Bld 5.3 4.8 - 5.6 %    Comment:          Prediabetes: 5.7 - 6.4          Diabetes: >6.4          Glycemic control for adults with diabetes: <7.0   TSH     Status: None   Collection Time: 10/13/22 12:15 PM  Result Value Ref Range   TSH 1.370 0.450 - 4.500 uIU/mL  RPR     Status: Abnormal   Collection Time: 10/13/22 12:15 PM  Result Value Ref Range   RPR Ser Ql Reactive (A) Non Reactive  RPR, quant & T.pallidum antibodies     Status: Abnormal   Collection Time: 10/13/22 12:15 PM  Result Value Ref Range   Rapid Plasma Reagin, Quant 1:16 (H) NonRea<1:1 titer   T Pallidum Abs Reactive (A) Non Reactive  VITAMIN D 25 Hydroxy (Vit-D Deficiency, Fractures)     Status: Abnormal   Collection Time: 10/13/22 12:15 PM  Result Value Ref Range   Vit D, 25-Hydroxy 15.1 (L) 30.0 - 100.0 ng/mL    Comment: Vitamin D deficiency has been defined by the [place] and an Endocrine Society practice guideline as a level of serum 25-OH vitamin D less than 20 ng/mL (1,2). The Endocrine Society went on to further define vitamin D insufficiency as a level between 21 and 29 ng/mL (2). 1. IOM (Institute of Medicine). 2010. Dietary reference    intakes for calcium and D. [place]: The    [employer]. 2. Holick MF, Binkley [place], Bischoff-Ferrari HA, et al.    Evaluation, treatment, and prevention of vitamin D    deficiency: an Endocrine Society clinical practice    guideline. JCEM. 2011 Jul; 96(7):1911-30.   HIV Antibody (routine testing w rflx)     Status: None   Collection Time: 10/13/22 12:15 PM  Result Value Ref Range    HIV Screen 4th Generation wRfx Non Reactive Non Reactive    Comment: HIV Negative HIV-1/HIV-2 antibodies and HIV-1 p24 antigen were NOT detected. There is no laboratory evidence of HIV infection.   Vitamin B12     Status: None   Collection Time: 10/13/22 12:15 PM  Result Value Ref Range   Vitamin B-12 591 232 - 1,245 pg/mL      Assessment & Plan:   Problem List Items Addressed This Visit     Bipolar 1 disorder ([place])   Obesity, Class III, BMI 40-49.9 (morbid obesity) ([place])   Relevant Medications   Semaglutide, 1 MG/DOSE, (OZEMPIC, 1 MG/DOSE,) 4 MG/3ML SOPN    Return in about 3 months (around 03/02/2023) for F/U.   Total time spent: 20 minutes  Mechele Claude, FNP  11/30/2022

## 2022-12-30 ENCOUNTER — Ambulatory Visit: Payer: Self-pay

## 2022-12-30 NOTE — Telephone Encounter (Signed)
  Chief Complaint: Spotting Symptoms: spotting light cramps Frequency: since Sunday Pertinent Negatives: Patient denies  Disposition: ED /[] Urgent Care (no appt availability in office) / Appointment(In office/virtual)/  [facility]/ Home Care/ Refused Recommended Disposition /[] [place] [facility]/  Follow-up with PCP Additional Notes: Pt thinks she may be pregnant. Pt has had light cramps and spotting since Sunday. Pt advised to take a home pregnancy test. Pt will follow up with PCP.    Reason for Disposition  [1] MILD bleeding or SPOTTING AND [2] could be pregnant (e.g., missed last period)  Answer Assessment - Initial Assessment Questions 1. AMOUNT: "Describe the bleeding that you are having."    - SPOTTING: spotting, or pinkish / brownish mucous discharge; does not fill panty liner or pad    - MILD:  less than 1 pad / hour; less than patient's usual menstrual bleeding   - MODERATE: 1-2 pads / hour; 1 menstrual cup every 6 hours; small-medium blood clots (e.g., pea, grape, small coin)   - SEVERE: soaking 2 or more pads/hour for 2 or more hours; 1 menstrual cup every 2 hours; bleeding not contained by pads or continuous red blood from vagina; large blood clots (e.g., golf ball, large coin)      spotting 2. ONSET: "When did the bleeding begin?" "Is it continuing now?"     Sunday to Monday 3. MENSTRUAL PERIOD: "When was the last normal menstrual period?" "How is this different than your period?"     Usual very regular 4. REGULARITY: "How regular are your periods?"     Very regular 5. ABDOMEN PAIN: "Do you have any pain?" "How bad is the pain?"  (e.g., Scale 1-10; mild, moderate, or severe)   - MILD (1-3): doesn't interfere with normal activities, abdomen soft and not tender to touch    - MODERATE (4-7): interferes with normal activities or awakens from sleep, abdomen tender to touch    - SEVERE (8-10): excruciating pain, doubled over, unable to do any normal  activities      none 6. PREGNANCY: "Is there any chance you are pregnant?" "When was your last menstrual period?"     yes 9. BLOOD THINNER MEDICINES: "Do you take any blood thinners?" (e.g., Coumadin / warfarin, Pradaxa / dabigatran, aspirin)     no 10. CAUSE: "What do you think is causing the bleeding?" (e.g., recent gyn surgery, recent gyn procedure; known bleeding disorder, cervical cancer, polycystic ovarian disease, fibroids)         pregnancy 12. OTHER SYMPTOMS: "What other symptoms are you having with the bleeding?" (e.g., passed tissue, vaginal discharge, fever, menstrual-type cramps)       Light cramping  Protocols used: Vaginal Bleeding - Abnormal-A-AH

## 2023-01-18 ENCOUNTER — Ambulatory Visit (INDEPENDENT_AMBULATORY_CARE_PROVIDER_SITE_OTHER): Payer: 59 | Admitting: Family

## 2023-01-18 VITALS — BP 120/74 | HR 96 | Ht 63.0 in | Wt 272.0 lb

## 2023-01-18 DIAGNOSIS — F319 Bipolar disorder, unspecified: Secondary | ICD-10-CM

## 2023-01-18 DIAGNOSIS — Z202 Contact with and (suspected) exposure to infections with a predominantly sexual mode of transmission: Secondary | ICD-10-CM | POA: Diagnosis not present

## 2023-01-18 DIAGNOSIS — E1165 Type 2 diabetes mellitus with hyperglycemia: Secondary | ICD-10-CM | POA: Diagnosis not present

## 2023-01-18 DIAGNOSIS — R112 Nausea with vomiting, unspecified: Secondary | ICD-10-CM

## 2023-01-18 DIAGNOSIS — R7303 Prediabetes: Secondary | ICD-10-CM

## 2023-01-18 DIAGNOSIS — N911 Secondary amenorrhea: Secondary | ICD-10-CM

## 2023-01-18 DIAGNOSIS — F339 Major depressive disorder, recurrent, unspecified: Secondary | ICD-10-CM

## 2023-01-18 LAB — POC CREATINE & ALBUMIN,URINE
Creatinine, POC: 300 mg/dL
Microalbumin Ur, POC: 80 mg/L

## 2023-01-18 LAB — POCT URINE PREGNANCY: Preg Test, Ur: NEGATIVE

## 2023-01-18 NOTE — Assessment & Plan Note (Signed)
Continue current medications.  Patient is well controlled at this time.   Reinforced the importance of maintaining medications.  

## 2023-01-18 NOTE — Assessment & Plan Note (Addendum)
Continue current medications.  Patient is well controlled at this time.   Reinforced the importance of maintaining medications.

## 2023-01-18 NOTE — Assessment & Plan Note (Signed)
Continue current meds.  Will adjust as needed based on results.  The patient is asked to make an attempt to improve diet and exercise patterns to aid in medical management of this problem. Addressed importance of increasing and maintaining water intake.   

## 2023-01-19 ENCOUNTER — Encounter: Payer: Self-pay | Admitting: Family

## 2023-01-19 NOTE — Progress Notes (Signed)
Established Patient Office Visit  Subjective:  Patient ID: Julie Mcclain, female    DOB: Feb 09, 1996  Age: 27 y.o. MRN: [id number]  Chief Complaint  Patient presents with   Follow-up    STD check, pregnancy test    Patient is here today for pregnancy test and STI testing.  She is around a week late for her most recent period.   She does admit that she has been having an increase in her stress level, as she recently lost her father.   She is concerned, given her history of STI's.  No other concerns at this time.   Past Medical History:  Diagnosis Date   IDA (iron deficiency anemia)    Menometrorrhagia    Obesity    Ovarian cyst    Snoring     Past Surgical History:  Procedure Laterality Date   denies      Social History   Socioeconomic History   Marital status: Single    Spouse name: Not on file   Number of children: 0   Years of education: 36   Highest education level: Not on file  Occupational History   Occupation: unemployed  Tobacco Use   Smoking status: Never    Passive exposure: Never   Smokeless tobacco: Never  Vaping Use   Vaping Use: Never used  Substance and Sexual Activity   Alcohol use: No   Drug use: No   Sexual activity: Yes    Birth control/protection: None  Other Topics Concern   Not on file  Social History Narrative   Not on file   Social Determinants of Health   Financial Resource Strain: Low Risk  (01/06/2022)   Overall Financial Resource Strain (CARDIA)    Difficulty of Paying Living Expenses: Not hard at all  Food Insecurity: Food Insecurity Present (01/06/2022)   Hunger Vital Sign    Worried About Running Out of Food in the Last Year: Sometimes true    Ran Out of Food in the Last Year: Sometimes true  Transportation Needs: Unmet Transportation Needs (01/06/2022)   PRAPARE - Administrator, Civil Service (Medical): Yes    Lack of Transportation (Non-Medical): No  Physical Activity: Insufficiently Active  (01/06/2022)   Exercise Vital Sign    Days of Exercise per Week: 1 day    Minutes of Exercise per Session: 30 min  Stress: No Stress Concern Present (01/06/2022)   [employer] of Occupational Health - Occupational Stress Questionnaire    Feeling of Stress : Only a little  Social Connections: Moderately Isolated (01/06/2022)   Social Connection and Isolation Panel [NHANES]    Frequency of Communication with Friends and Family: More than three times a week    Frequency of Social Gatherings with Friends and Family: More than three times a week    Attends Religious Services: More than 4 times per year    Active Member of [employer] or Organizations: No    Attends Banker Meetings: Never    Marital Status: Never married  Intimate Partner Violence: Not At Risk (07/14/2022)   Humiliation, Afraid, Rape, and Kick questionnaire    Fear of Current or Ex-Partner: No    Emotionally Abused: No    Physically Abused: No    Sexually Abused: No    Family History  Problem Relation Age of Onset   Diabetes Father        blind in one eye    No Known Allergies  Review of Systems  Endo/Heme/Allergies:        Amenorrhea  All other systems reviewed and are negative.      Objective:   BP 120/74   Pulse 96   Ht 5\' 3"  (1.6 m)   Wt 272 lb (123.4 kg)   SpO2 96%   BMI 48.18 kg/m   Vitals:   01/18/23 0913  BP: 120/74  Pulse: 96  Height: 5\' 3"  (1.6 m)  Weight: 272 lb (123.4 kg)  SpO2: 96%  BMI (Calculated): 48.19    Physical Exam Vitals and nursing note reviewed.  Constitutional:      Appearance: Normal appearance. She is normal weight.  HENT:     Head: Normocephalic.  Eyes:     Extraocular Movements: Extraocular movements intact.     Conjunctiva/sclera: Conjunctivae normal.     Pupils: Pupils are equal, round, and reactive to light.  Cardiovascular:     Rate and Rhythm: Normal rate.  Pulmonary:     Effort: Pulmonary effort is normal.  Neurological:     Mental  Status: She is alert.  Psychiatric:        Attention and Perception: Attention and perception normal.        Mood and Affect: Affect normal. Mood is anxious.        Behavior: Behavior normal. Behavior is cooperative.        Thought Content: Thought content normal.        Cognition and Memory: Cognition and memory normal.        Judgment: Judgment normal.      Results for orders placed or performed in visit on 01/18/23  POC CREATINE & ALBUMIN,URINE  Result Value Ref Range   Microalbumin Ur, POC 80 mg/L   Creatinine, POC 300 mg/dL   Albumin/Creatinine Ratio, Urine, POC 30-300   POCT urine pregnancy  Result Value Ref Range   Preg Test, Ur Negative Negative    Recent Results (from the past 2160 hour(s))  POC CREATINE & ALBUMIN,URINE     Status: None   Collection Time: 01/18/23  9:38 AM  Result Value Ref Range   Microalbumin Ur, POC 80 mg/L   Creatinine, POC 300 mg/dL   Albumin/Creatinine Ratio, Urine, POC 30-300   POCT urine pregnancy     Status: None   Collection Time: 01/18/23  9:39 AM  Result Value Ref Range   Preg Test, Ur Negative Negative       Assessment & Plan:   Problem List Items Addressed This Visit       Active Problems   Bipolar 1 disorder (HCC)    Continue current medications.  Patient is well controlled at this time.   Reinforced the importance of maintaining medications.       Depression, recurrent (HCC)    Continue current medications.  Patient is well controlled at this time.   Reinforced the importance of maintaining medications.       Obesity, Class III, BMI 40-49.9 (morbid obesity) (HCC)    Continue current meds.  Will adjust as needed based on results.  The patient is asked to make an attempt to improve diet and exercise patterns to aid in medical management of this problem. Addressed importance of increasing and maintaining water intake.       Prediabetes   Relevant Orders   POC CREATINE & ALBUMIN,URINE (Completed)   Other Visit  Diagnoses     Contact with and (suspected) exposure to infections with a predominantly sexual mode of transmission    -  Primary   Relevant Orders   HSV 1 and 2 Ab, IgG   RPR w/reflex to TrepSure   Acute Viral Hepatitis (HAV, HBV, HCV)   HIV Antibody (routine testing w rflx)   Pregnancy Test, Urine, Qual   Chlamydia/Gonococcus/Trichomonas, NAA   Nausea and vomiting, unspecified vomiting type       Relevant Orders   Pregnancy Test, Urine, Qual   POCT urine pregnancy (Completed)   Secondary amenorrhea       Relevant Orders   HSV 1 and 2 Ab, IgG   RPR w/reflex to TrepSure   Acute Viral Hepatitis (HAV, HBV, HCV)   HIV Antibody (routine testing w rflx)   Pregnancy Test, Urine, Qual   POCT urine pregnancy (Completed)       Return as previously scheduled.   Total time spent: 20 minutes  Miki Kins, FNP  01/18/2023

## 2023-01-20 ENCOUNTER — Encounter: Payer: Self-pay | Admitting: Family

## 2023-01-20 ENCOUNTER — Telehealth: Payer: Self-pay | Admitting: Family

## 2023-01-20 LAB — HSV 1 AND 2 AB, IGG
HSV 1 Glycoprotein G Ab, IgG: >62.2 index — ABNORMAL HIGH (ref 0.00–0.90)
HSV 2 IgG, Type Spec: <0.91 index (ref 0.00–0.90)

## 2023-01-20 LAB — ACUTE VIRAL HEPATITIS (HAV, HBV, HCV)
HCV Ab: NONREACTIVE
Hep A IgM: NEGATIVE
Hep B C IgM: NEGATIVE
Hepatitis B Surface Ag: NEGATIVE

## 2023-01-20 LAB — RPR W/REFLEX TO TREPSURE: RPR: REACTIVE — AB

## 2023-01-20 LAB — CHLAMYDIA/GONOCOCCUS/TRICHOMONAS, NAA
Chlamydia by NAA: NEGATIVE
Gonococcus by NAA: POSITIVE — AB
Trich vag by NAA: POSITIVE — AB

## 2023-01-20 LAB — HIV ANTIBODY (ROUTINE TESTING W REFLEX): HIV Screen 4th Generation wRfx: NONREACTIVE

## 2023-01-20 LAB — RPR, QUANT: RPR, Quant: 1:8 {titer} — ABNORMAL HIGH

## 2023-01-20 LAB — PREGNANCY, URINE: Preg Test, Ur: NEGATIVE

## 2023-01-20 LAB — HCV INTERPRETATION

## 2023-01-20 MED ORDER — METRONIDAZOLE 500 MG PO TABS: 500.0000 mg | ORAL_TABLET | Freq: Two times a day (BID) | ORAL | 0 refills | Status: AC

## 2023-01-20 MED ORDER — AZITHROMYCIN 500 MG PO TABS: 2000.0000 mg | ORAL_TABLET | Freq: Once | ORAL | 0 refills | Status: AC

## 2023-01-20 MED ORDER — CEFIXIME 400 MG PO CAPS: 800.0000 mg | ORAL_CAPSULE | Freq: Every day | ORAL | 0 refills | Status: DC

## 2023-01-20 NOTE — Telephone Encounter (Signed)
Patient's mother called stating the patient has been trying to call our office and not getting an answer. States that the patient seen her test results on MyChart but has not gotten a call yet and she is concerned. Please interpret results so we can call the patient with her results.

## 2023-01-20 NOTE — Addendum Note (Signed)
Addended by: Grayling Congress on: 01/20/2023 06:08 PM   Modules accepted: Orders

## 2023-02-16 ENCOUNTER — Telehealth: Payer: Self-pay | Admitting: Family

## 2023-02-16 NOTE — Telephone Encounter (Signed)
Patient's mother, Rinaldo Cloud, left VM stating that she needs a letter for patient for disability stating her diagnosis and her medications. Please advise.

## 2023-02-21 ENCOUNTER — Encounter: Payer: Self-pay | Admitting: Family

## 2023-02-22 ENCOUNTER — Other Ambulatory Visit: Payer: Self-pay

## 2023-02-22 MED ORDER — OZEMPIC (1 MG/DOSE) 4 MG/3ML ~~LOC~~ SOPN: 1.0000 mg | PEN_INJECTOR | SUBCUTANEOUS | 1 refills | Status: DC

## 2023-02-23 ENCOUNTER — Encounter: Payer: Self-pay | Admitting: Family

## 2023-02-24 NOTE — Telephone Encounter (Signed)
Letter was written and messaged to pt.

## 2023-03-01 ENCOUNTER — Ambulatory Visit (INDEPENDENT_AMBULATORY_CARE_PROVIDER_SITE_OTHER): Payer: 59 | Admitting: Family

## 2023-03-01 ENCOUNTER — Encounter: Payer: Self-pay | Admitting: Family

## 2023-03-01 VITALS — BP 118/82 | HR 86 | Ht 64.0 in | Wt 275.8 lb

## 2023-03-01 DIAGNOSIS — R7303 Prediabetes: Secondary | ICD-10-CM

## 2023-03-01 DIAGNOSIS — F319 Bipolar disorder, unspecified: Secondary | ICD-10-CM | POA: Diagnosis not present

## 2023-03-01 DIAGNOSIS — E66813 Obesity, class 3: Secondary | ICD-10-CM

## 2023-03-01 DIAGNOSIS — Z202 Contact with and (suspected) exposure to infections with a predominantly sexual mode of transmission: Secondary | ICD-10-CM

## 2023-03-01 DIAGNOSIS — F339 Major depressive disorder, recurrent, unspecified: Secondary | ICD-10-CM

## 2023-03-01 MED ORDER — OZEMPIC (2 MG/DOSE) 8 MG/3ML ~~LOC~~ SOPN: 2.0000 mg | PEN_INJECTOR | SUBCUTANEOUS | 0 refills | Status: DC

## 2023-03-01 NOTE — Assessment & Plan Note (Signed)
Continue current meds.  Will adjust as needed based on results.  The patient is asked to make an attempt to improve diet and exercise patterns to aid in medical management of this problem. Addressed importance of increasing and maintaining water intake.   

## 2023-03-01 NOTE — Assessment & Plan Note (Signed)
Patient stable.  Well controlled with current therapy.   Continue current meds.  

## 2023-03-01 NOTE — Assessment & Plan Note (Signed)

## 2023-03-01 NOTE — Progress Notes (Signed)
Established Patient Office Visit  Subjective:  Patient ID: Julie Mcclain, female    DOB: 06/07/1996  Age: 27 y.o. MRN: [id number]  Chief Complaint  Patient presents with   Follow-up    3 month follow up    Patient is here today for her 4 months follow up.  She has been feeling fairly well since last appointment.   She does not have additional concerns to discuss today.  Labs are due today. She needs refills.   I have reviewed her active problem list, medication list, allergies, notes from last encounter, lab results for her appointment today.    No other concerns at this time.   Past Medical History:  Diagnosis Date   IDA (iron deficiency anemia)    Menometrorrhagia    Obesity    Ovarian cyst    Snoring     Past Surgical History:  Procedure Laterality Date   denies      Social History   Socioeconomic History   Marital status: Single    Spouse name: Not on file   Number of children: 0   Years of education: 30   Highest education level: Not on file  Occupational History   Occupation: unemployed  Tobacco Use   Smoking status: Never    Passive exposure: Never   Smokeless tobacco: Never  Vaping Use   Vaping Use: Never used  Substance and Sexual Activity   Alcohol use: No   Drug use: No   Sexual activity: Yes    Birth control/protection: None  Other Topics Concern   Not on file  Social History Narrative   Not on file   Social Determinants of Health   Financial Resource Strain: Low Risk  (01/06/2022)   Overall Financial Resource Strain (CARDIA)    Difficulty of Paying Living Expenses: Not hard at all  Food Insecurity: Food Insecurity Present (01/06/2022)   Hunger Vital Sign    Worried About Running Out of Food in the Last Year: Sometimes true    Ran Out of Food in the Last Year: Sometimes true  Transportation Needs: Unmet Transportation Needs (01/06/2022)   PRAPARE - Administrator, Civil Service (Medical): Yes    Lack of  Transportation (Non-Medical): No  Physical Activity: Insufficiently Active (01/06/2022)   Exercise Vital Sign    Days of Exercise per Week: 1 day    Minutes of Exercise per Session: 30 min  Stress: No Stress Concern Present (01/06/2022)   [employer] of Occupational Health - Occupational Stress Questionnaire    Feeling of Stress : Only a little  Social Connections: Moderately Isolated (01/06/2022)   Social Connection and Isolation Panel [NHANES]    Frequency of Communication with Friends and Family: More than three times a week    Frequency of Social Gatherings with Friends and Family: More than three times a week    Attends Religious Services: More than 4 times per year    Active Member of [employer] or Organizations: No    Attends Banker Meetings: Never    Marital Status: Never married  Intimate Partner Violence: Not At Risk (07/14/2022)   Humiliation, Afraid, Rape, and Kick questionnaire    Fear of Current or Ex-Partner: No    Emotionally Abused: No    Physically Abused: No    Sexually Abused: No    Family History  Problem Relation Age of Onset   Diabetes Father        blind in one eye  No Known Allergies  Review of Systems  All other systems reviewed and are negative.      Objective:   BP 118/82   Pulse 86   Ht 5\' 4"  (1.626 m)   Wt 275 lb 12.8 oz (125.1 kg)   SpO2 99%   BMI 47.34 kg/m   Vitals:   03/01/23 1104  BP: 118/82  Pulse: 86  Height: 5\' 4"  (1.626 m)  Weight: 275 lb 12.8 oz (125.1 kg)  SpO2: 99%  BMI (Calculated): 47.32    Physical Exam Vitals and nursing note reviewed.  Constitutional:      Appearance: Normal appearance. She is normal weight.  HENT:     Head: Normocephalic.  Eyes:     Extraocular Movements: Extraocular movements intact.     Conjunctiva/sclera: Conjunctivae normal.     Pupils: Pupils are equal, round, and reactive to light.  Cardiovascular:     Rate and Rhythm: Normal rate.     Pulses: Normal pulses.   Pulmonary:     Effort: Pulmonary effort is normal.  Neurological:     General: No focal deficit present.     Mental Status: She is alert and oriented to person, place, and time. Mental status is at baseline.  Psychiatric:        Mood and Affect: Mood normal.        Behavior: Behavior normal.        Thought Content: Thought content normal.        Judgment: Judgment normal.      No results found for any visits on 03/01/23.  Recent Results (from the past 2160 hour(s))  POC CREATINE & ALBUMIN,URINE     Status: None   Collection Time: 01/18/23  9:38 AM  Result Value Ref Range   Microalbumin Ur, POC 80 mg/L   Creatinine, POC 300 mg/dL   Albumin/Creatinine Ratio, Urine, POC 30-300   POCT urine pregnancy     Status: None   Collection Time: 01/18/23  9:39 AM  Result Value Ref Range   Preg Test, Ur Negative Negative  HSV 1 and 2 Ab, IgG     Status: Abnormal   Collection Time: 01/18/23  9:47 AM  Result Value Ref Range   HSV 1 Glycoprotein G Ab, IgG >62.20 (H) 0.00 - 0.90 index    Comment:                                  Negative        <0.91                                  Equivocal 0.91 - 1.09                                  Positive        >1.09  Note: Negative indicates no antibodies detected to  HSV-1. Equivocal may suggest early infection.  If  clinically appropriate, retest at later date. Positive  indicates antibodies detected to HSV-1.    HSV 2 IgG, Type Spec <0.91 0.00 - 0.90 index    Comment:  Negative        <0.91                                  Equivocal 0.91 - 1.09                                  Positive        >1.09  HSV-2 Antibody Interpretation: Current guidelines and  recommendations do not recommend routine screening  for HSV-2 in asymptomatic individuals, including  those that are pregnant. A negative antibody result  indicates no detectable antibodies to HSV-2 were  found. If recent exposure is suspected, retest in 4   to 6 weeks. Equivocal samples should be retested in  4 to 6 weeks. A positive result indicates the  presence of detectable IgG antibody to HSV-2.  FALSE POSITIVE RESULTS MAY OCCUR. Repeat testing, or  testing by a different method, may be indicated in  some settings (e.g. patients with low likelihood of  HSV infection). If clinically appropriate, retest 4  to 6 weeks later. HSV-2 IgG antibody testing results  should be clinically correlated.   RPR w/reflex to TrepSure     Status: Abnormal   Collection Time: 01/18/23  9:47 AM  Result Value Ref Range   RPR Reactive (A) Non Reactive  Acute Viral Hepatitis (HAV, HBV, HCV)     Status: None   Collection Time: 01/18/23  9:47 AM  Result Value Ref Range   Hep A IgM Negative Negative   Hepatitis B Surface Ag Negative Negative   Hep B C IgM Negative Negative   HCV Ab Non Reactive Non Reactive  HIV Antibody (routine testing w rflx)     Status: None   Collection Time: 01/18/23  9:47 AM  Result Value Ref Range   HIV Screen 4th Generation wRfx Non Reactive Non Reactive    Comment: HIV-1/HIV-2 antibodies and HIV-1 p24 antigen were NOT detected. There is no laboratory evidence of HIV infection. HIV Negative   Pregnancy Test, Urine, Qual     Status: None   Collection Time: 01/18/23  9:47 AM  Result Value Ref Range   Preg Test, Ur Negative Negative  Interpretation:     Status: None   Collection Time: 01/18/23  9:47 AM  Result Value Ref Range   HCV Interp 1: Comment     Comment: Not infected with HCV unless early or acute infection is suspected (which may be delayed in an immunocompromised individual), or other evidence exists to indicate HCV infection.   RPR, Quant     Status: Abnormal   Collection Time: 01/18/23  9:47 AM  Result Value Ref Range   RPR, Quant 1:8 (H) NonRea<1:1 titer    Comment: This test is intended ONLY for specimens that have tested positive (reactive) or equivocal for Treponema pallidum antibodies prior to submission  for testing. For the full CDC-recommended syphilis screening and diagnosis algorithm, [facility] offers test code 012005 RPR, Rfx Qn RPR/Confirm TP or 811914 T pallidum Screening Cascade.   Chlamydia/Gonococcus/Trichomonas, NAA     Status: Abnormal   Collection Time: 01/18/23 12:14 PM   Specimen: Urine   UC  Result Value Ref Range   Chlamydia by NAA Negative Negative   Gonococcus by NAA Positive (A) Negative   Trich vag by NAA Positive (A) Negative       Assessment &  Plan:   Problem List Items Addressed This Visit       Active Problems   Bipolar 1 disorder (HCC)    Patient stable.  Well controlled with current therapy.   Continue current meds.       Depression, recurrent (HCC)    Patient stable.  Well controlled with current therapy.   Continue current meds.       Obesity, Class III, BMI 40-49.9 (morbid obesity) (HCC)    Continue current meds.  Will adjust as needed based on results.  The patient is asked to make an attempt to improve diet and exercise patterns to aid in medical management of this problem. Addressed importance of increasing and maintaining water intake.       Relevant Medications   Semaglutide, 2 MG/DOSE, (OZEMPIC, 2 MG/DOSE,) 8 MG/3ML SOPN   Prediabetes    A1C Continues to be in prediabetic ranges.  Will reassess at follow up after next lab check.  Patient counseled on dietary choices and verbalized understanding.  Patient educated on foods that contain carbohydrates and the need to decrease intake.  We discussed prediabetes, and what it means and the need for strict dietary control to prevent progression to type 2 diabetes.  Advised to decrease intake of sugary drinks, including sodas, sweet tea, and some juices, and of starch and sugar heavy foods (ie., potatoes, rice, bread, pasta, desserts). She verbalizes understanding and agreement with the changes discussed today.       Other Visit Diagnoses     Contact with and (suspected) exposure to  infections with a predominantly sexual mode of transmission    -  Primary   retesting today.  Will call pt with results.   Relevant Orders   NuSwab Vaginitis Plus (VG+)   RPR w/reflex to TrepSure   HIV Antibody (routine testing w rflx)       Return in about 1 month (around 03/31/2023) for F/U.   Total time spent: 30 minutes  Miki Kins, FNP  03/01/2023   This document may have been prepared by [facility] Voice Recognition software and as such may include unintentional dictation errors.

## 2023-03-02 LAB — RPR, QUANT: RPR, Quant: 1:4 {titer} — ABNORMAL HIGH

## 2023-03-02 LAB — RPR W/REFLEX TO TREPSURE: RPR: REACTIVE — AB

## 2023-03-02 LAB — HIV ANTIBODY (ROUTINE TESTING W REFLEX): HIV Screen 4th Generation wRfx: NONREACTIVE

## 2023-03-04 LAB — NUSWAB VAGINITIS PLUS (VG+)
Candida albicans, NAA: NEGATIVE
Candida glabrata, NAA: NEGATIVE
Chlamydia trachomatis, NAA: NEGATIVE
Neisseria gonorrhoeae, NAA: NEGATIVE
Trich vag by NAA: NEGATIVE

## 2023-03-09 ENCOUNTER — Encounter: Payer: Self-pay | Admitting: Family

## 2023-03-31 ENCOUNTER — Ambulatory Visit (INDEPENDENT_AMBULATORY_CARE_PROVIDER_SITE_OTHER): Payer: 59 | Admitting: Family

## 2023-03-31 VITALS — BP 139/80 | HR 86 | Ht 64.0 in | Wt 271.2 lb

## 2023-03-31 DIAGNOSIS — F319 Bipolar disorder, unspecified: Secondary | ICD-10-CM

## 2023-03-31 DIAGNOSIS — E782 Mixed hyperlipidemia: Secondary | ICD-10-CM

## 2023-03-31 DIAGNOSIS — R7303 Prediabetes: Secondary | ICD-10-CM

## 2023-03-31 DIAGNOSIS — R1010 Upper abdominal pain, unspecified: Secondary | ICD-10-CM

## 2023-03-31 DIAGNOSIS — F339 Major depressive disorder, recurrent, unspecified: Secondary | ICD-10-CM

## 2023-03-31 LAB — POCT URINALYSIS DIPSTICK
Bilirubin, UA: NEGATIVE
Blood, UA: NEGATIVE
Glucose, UA: NEGATIVE
Ketones, UA: NEGATIVE
Leukocytes, UA: NEGATIVE
Nitrite, UA: NEGATIVE
Protein, UA: POSITIVE — AB
Spec Grav, UA: >=1.03 — AB (ref 1.010–1.025)
Urobilinogen, UA: 0.2 E.U./dL
pH, UA: 6 (ref 5.0–8.0)

## 2023-03-31 MED ORDER — OZEMPIC (2 MG/DOSE) 8 MG/3ML ~~LOC~~ SOPN: 2.0000 mg | PEN_INJECTOR | SUBCUTANEOUS | 0 refills | Status: DC

## 2023-03-31 MED ORDER — BUPROPION HCL ER (XL) 300 MG PO TB24: 300.0000 mg | ORAL_TABLET | Freq: Every day | ORAL | 1 refills | Status: DC

## 2023-03-31 MED ORDER — ESOMEPRAZOLE MAGNESIUM 40 MG PO CPDR: 40.0000 mg | DELAYED_RELEASE_CAPSULE | Freq: Every day | ORAL | 1 refills | Status: DC

## 2023-03-31 MED ORDER — ARIPIPRAZOLE 5 MG PO TABS: 5.0000 mg | ORAL_TABLET | Freq: Every day | ORAL | 1 refills | Status: DC

## 2023-03-31 MED ORDER — BUSPIRONE HCL 10 MG PO TABS: 10.0000 mg | ORAL_TABLET | Freq: Two times a day (BID) | ORAL | 1 refills | Status: DC

## 2023-03-31 MED ORDER — VITAMIN D (ERGOCALCIFEROL) 1.25 MG (50000 UNIT) PO CAPS: 50000.0000 [IU] | ORAL_CAPSULE | ORAL | 3 refills | Status: DC

## 2023-03-31 NOTE — Progress Notes (Signed)
Established Patient Office Visit  Subjective:  Patient ID: Julie Mcclain, female    DOB: 07/31/1996  Age: 27 y.o. MRN: [id number]  Chief Complaint  Patient presents with   Follow-up    1 mo F/u    Patient is here today for her 1 month follow up.  She has been feeling well since last appointment.   She does have additional concerns to discuss today.  She is concerned that she might have a UTI, says she has been having some dysuria and urgency.   Labs are not due today. She needs refills.   I have reviewed her active problem list, medication list, allergies, notes from last encounter, lab results for her appointment today.    No other concerns at this time.   Past Medical History:  Diagnosis Date   IDA (iron deficiency anemia)    Menometrorrhagia    Obesity    Ovarian cyst    Snoring     Past Surgical History:  Procedure Laterality Date   denies      Social History   Socioeconomic History   Marital status: Single    Spouse name: Not on file   Number of children: 0   Years of education: 44   Highest education level: Not on file  Occupational History   Occupation: unemployed  Tobacco Use   Smoking status: Never    Passive exposure: Never   Smokeless tobacco: Never  Vaping Use   Vaping status: Never Used  Substance and Sexual Activity   Alcohol use: No   Drug use: No   Sexual activity: Yes    Birth control/protection: None  Other Topics Concern   Not on file  Social History Narrative   Not on file   Social Determinants of Health   Financial Resource Strain: Low Risk  (01/06/2022)   Overall Financial Resource Strain (CARDIA)    Difficulty of Paying Living Expenses: Not hard at all  Food Insecurity: Food Insecurity Present (01/06/2022)   Hunger Vital Sign    Worried About Running Out of Food in the Last Year: Sometimes true    Ran Out of Food in the Last Year: Sometimes true  Transportation Needs: Unmet Transportation Needs (01/06/2022)    PRAPARE - Administrator, Civil Service (Medical): Yes    Lack of Transportation (Non-Medical): No  Physical Activity: Insufficiently Active (01/06/2022)   Exercise Vital Sign    Days of Exercise per Week: 1 day    Minutes of Exercise per Session: 30 min  Stress: No Stress Concern Present (01/06/2022)   [employer] of Occupational Health - Occupational Stress Questionnaire    Feeling of Stress : Only a little  Social Connections: Moderately Isolated (01/06/2022)   Social Connection and Isolation Panel [NHANES]    Frequency of Communication with Friends and Family: More than three times a week    Frequency of Social Gatherings with Friends and Family: More than three times a week    Attends Religious Services: More than 4 times per year    Active Member of [employer] or Organizations: No    Attends Banker Meetings: Never    Marital Status: Never married  Intimate Partner Violence: Not At Risk (07/14/2022)   Humiliation, Afraid, Rape, and Kick questionnaire    Fear of Current or Ex-Partner: No    Emotionally Abused: No    Physically Abused: No    Sexually Abused: No    Family History  Problem Relation Age  of Onset   Diabetes Father        blind in one eye    No Known Allergies  Review of Systems  Genitourinary:  Positive for dysuria and urgency.  All other systems reviewed and are negative.      Objective:   BP 139/80   Pulse 86   Ht 5\' 4"  (1.626 m)   Wt 271 lb 3.2 oz (123 kg)   SpO2 98%   BMI 46.55 kg/m   Vitals:   03/31/23 1305  BP: 139/80  Pulse: 86  Height: 5\' 4"  (1.626 m)  Weight: 271 lb 3.2 oz (123 kg)  SpO2: 98%  BMI (Calculated): 46.53    Physical Exam Vitals and nursing note reviewed.  Constitutional:      Appearance: Normal appearance. She is normal weight.  HENT:     Head: Normocephalic.  Eyes:     Pupils: Pupils are equal, round, and reactive to light.  Cardiovascular:     Rate and Rhythm: Normal rate.  Pulmonary:      Effort: Pulmonary effort is normal.  Neurological:     Mental Status: She is alert.  Psychiatric:        Attention and Perception: Attention and perception normal.        Mood and Affect: Mood normal.        Behavior: Behavior normal. Behavior is cooperative.        Thought Content: Thought content normal.        Judgment: Judgment is impulsive.      Results for orders placed or performed in visit on 03/31/23  POCT Urinalysis Dipstick (81002)  Result Value Ref Range   Color, UA yellow    Clarity, UA clear    Glucose, UA Negative Negative   Bilirubin, UA negative    Ketones, UA negative    Spec Grav, UA >=1.030 (A) 1.010 - 1.025   Blood, UA negative    pH, UA 6.0 5.0 - 8.0   Protein, UA Positive (A) Negative   Urobilinogen, UA 0.2 0.2 or 1.0 E.U./dL   Nitrite, UA negative    Leukocytes, UA Negative Negative   Appearance     Odor      Recent Results (from the past 2160 hour(s))  POC CREATINE & ALBUMIN,URINE     Status: None   Collection Time: 01/18/23  9:38 AM  Result Value Ref Range   Microalbumin Ur, POC 80 mg/L   Creatinine, POC 300 mg/dL   Albumin/Creatinine Ratio, Urine, POC 30-300   POCT urine pregnancy     Status: None   Collection Time: 01/18/23  9:39 AM  Result Value Ref Range   Preg Test, Ur Negative Negative  HSV 1 and 2 Ab, IgG     Status: Abnormal   Collection Time: 01/18/23  9:47 AM  Result Value Ref Range   HSV 1 Glycoprotein G Ab, IgG >62.20 (H) 0.00 - 0.90 index    Comment:                                  Negative        <0.91                                  Equivocal 0.91 - 1.09  Positive        >1.09  Note: Negative indicates no antibodies detected to  HSV-1. Equivocal may suggest early infection.  If  clinically appropriate, retest at later date. Positive  indicates antibodies detected to HSV-1.    HSV 2 IgG, Type Spec <0.91 0.00 - 0.90 index    Comment:                                  Negative         <0.91                                  Equivocal 0.91 - 1.09                                  Positive        >1.09  HSV-2 Antibody Interpretation: Current guidelines and  recommendations do not recommend routine screening  for HSV-2 in asymptomatic individuals, including  those that are pregnant. A negative antibody result  indicates no detectable antibodies to HSV-2 were  found. If recent exposure is suspected, retest in 4  to 6 weeks. Equivocal samples should be retested in  4 to 6 weeks. A positive result indicates the  presence of detectable IgG antibody to HSV-2.  FALSE POSITIVE RESULTS MAY OCCUR. Repeat testing, or  testing by a different method, may be indicated in  some settings (e.g. patients with low likelihood of  HSV infection). If clinically appropriate, retest 4  to 6 weeks later. HSV-2 IgG antibody testing results  should be clinically correlated.   RPR w/reflex to TrepSure     Status: Abnormal   Collection Time: 01/18/23  9:47 AM  Result Value Ref Range   RPR Reactive (A) Non Reactive  Acute Viral Hepatitis (HAV, HBV, HCV)     Status: None   Collection Time: 01/18/23  9:47 AM  Result Value Ref Range   Hep A IgM Negative Negative   Hepatitis B Surface Ag Negative Negative   Hep B C IgM Negative Negative   HCV Ab Non Reactive Non Reactive  HIV Antibody (routine testing w rflx)     Status: None   Collection Time: 01/18/23  9:47 AM  Result Value Ref Range   HIV Screen 4th Generation wRfx Non Reactive Non Reactive    Comment: HIV-1/HIV-2 antibodies and HIV-1 p24 antigen were NOT detected. There is no laboratory evidence of HIV infection. HIV Negative   Pregnancy Test, Urine, Qual     Status: None   Collection Time: 01/18/23  9:47 AM  Result Value Ref Range   Preg Test, Ur Negative Negative  Interpretation:     Status: None   Collection Time: 01/18/23  9:47 AM  Result Value Ref Range   HCV Interp 1: Comment     Comment: Not infected with HCV unless early or  acute infection is suspected (which may be delayed in an immunocompromised individual), or other evidence exists to indicate HCV infection.   RPR, Quant     Status: Abnormal   Collection Time: 01/18/23  9:47 AM  Result Value Ref Range   RPR, Quant 1:8 (H) NonRea<1:1 titer    Comment: This test is intended ONLY for specimens that have tested positive (reactive) or equivocal for Treponema pallidum antibodies  prior to submission for testing. For the full CDC-recommended syphilis screening and diagnosis algorithm, [facility] offers test code 012005 RPR, Rfx Qn RPR/Confirm TP or 469629 T pallidum Screening Cascade.   Chlamydia/Gonococcus/Trichomonas, NAA     Status: Abnormal   Collection Time: 01/18/23 12:14 PM   Specimen: Urine   UC  Result Value Ref Range   Chlamydia by NAA Negative Negative   Gonococcus by NAA Positive (A) Negative   Trich vag by NAA Positive (A) Negative  RPR w/reflex to TrepSure     Status: Abnormal   Collection Time: 03/01/23 11:48 AM  Result Value Ref Range   RPR Reactive (A) Non Reactive  HIV Antibody (routine testing w rflx)     Status: None   Collection Time: 03/01/23 11:48 AM  Result Value Ref Range   HIV Screen 4th Generation wRfx Non Reactive Non Reactive    Comment: HIV-1/HIV-2 antibodies and HIV-1 p24 antigen were NOT detected. There is no laboratory evidence of HIV infection. HIV Negative   RPR, Quant     Status: Abnormal   Collection Time: 03/01/23 11:48 AM  Result Value Ref Range   RPR, Quant 1:4 (H) NonRea<1:1 titer    Comment: This test is intended ONLY for specimens that have tested positive (reactive) or equivocal for Treponema pallidum antibodies prior to submission for testing. For the full CDC-recommended syphilis screening and diagnosis algorithm, [facility] offers test code 012005 RPR, Rfx Qn RPR/Confirm TP or 528413 T pallidum Screening Cascade.   NuSwab Vaginitis Plus (VG+)     Status: None   Collection Time: 03/01/23 12:12 PM  Result  Value Ref Range   Atopobium vaginae Moderate - 1 Score   BVAB 2 Low - 0 Score   Megasphaera 1 Low - 0 Score    Comment: Calculate total score by adding the 3 individual bacterial vaginosis (BV) marker scores together.  Total score is interpreted as follows: Total score 0-1: Indicates the absence of BV. Total score   2: Indeterminate for BV. Additional clinical                  data should be evaluated to establish a                  diagnosis. Total score 3-6: Indicates the presence of BV.    Candida albicans, NAA Negative Negative   Candida glabrata, NAA Negative Negative   Trich vag by NAA Negative Negative   Chlamydia trachomatis, NAA Negative Negative   Neisseria gonorrhoeae, NAA Negative Negative  POCT Urinalysis Dipstick (24401)     Status: Abnormal   Collection Time: 03/31/23  2:01 PM  Result Value Ref Range   Color, UA yellow    Clarity, UA clear    Glucose, UA Negative Negative   Bilirubin, UA negative    Ketones, UA negative    Spec Grav, UA >=1.030 (A) 1.010 - 1.025   Blood, UA negative    pH, UA 6.0 5.0 - 8.0   Protein, UA Positive (A) Negative   Urobilinogen, UA 0.2 0.2 or 1.0 E.U./dL   Nitrite, UA negative    Leukocytes, UA Negative Negative   Appearance     Odor         Assessment & Plan:   Problem List Items Addressed This Visit       Active Problems   Bipolar 1 disorder (HCC)    Patient stable.  Well controlled with current therapy.   Continue current meds.  Depression, recurrent (HCC)    Patient stable.  Well controlled with current therapy.   Continue current meds.        Relevant Medications   buPROPion (WELLBUTRIN XL) 300 MG 24 hr tablet   busPIRone (BUSPAR) 10 MG tablet   Obesity, Class III, BMI 40-49.9 (morbid obesity) (HCC)    Continue current meds.  Will adjust as needed based on results.  The patient is asked to make an attempt to improve diet and exercise patterns to aid in medical management of this  problem. Addressed importance of increasing and maintaining water intake.        Relevant Medications   Semaglutide, 2 MG/DOSE, (OZEMPIC, 2 MG/DOSE,) 8 MG/3ML SOPN   Hyperlipidemia    Checking labs today.  Continue current therapy for lipid control. Will modify as needed based on labwork results.        Prediabetes    Patient educated on foods that contain carbohydrates and the need to decrease intake.  We discussed prediabetes, and what it means and the need for strict dietary control to prevent progression to type 2 diabetes.  Advised to decrease intake of sugary drinks, including sodas, sweet tea, and some juices, and of starch and sugar heavy foods (ie., potatoes, rice, bread, pasta, desserts). She verbalizes understanding and agreement with the changes discussed today.  A1C Continues to be in prediabetic ranges.  Will reassess at follow up after next lab check.  Patient counseled on dietary choices and verbalized understanding.        Other Visit Diagnoses     Pain of upper abdomen    -  Primary   Will get U/S of abdomen to see if we can determine the cause of her abdominal pain.   Relevant Orders   US Abdomen Complete   POCT Urinalysis Dipstick (81191) (Completed)       Return in about 1 month (around 05/01/2023) for F/U.   Total time spent: 30 minutes  Miki Kins, FNP  03/31/2023   This document may have been prepared by [facility] Voice Recognition software and as such may include unintentional dictation errors.

## 2023-04-09 ENCOUNTER — Encounter: Payer: Self-pay | Admitting: Family

## 2023-04-09 NOTE — Assessment & Plan Note (Signed)

## 2023-04-09 NOTE — Assessment & Plan Note (Signed)
Patient stable.  Well controlled with current therapy.   Continue current meds.  

## 2023-04-09 NOTE — Assessment & Plan Note (Signed)
Checking labs today.  Continue current therapy for lipid control. Will modify as needed based on labwork results.  

## 2023-04-09 NOTE — Assessment & Plan Note (Signed)
Continue current meds.  Will adjust as needed based on results.  The patient is asked to make an attempt to improve diet and exercise patterns to aid in medical management of this problem. Addressed importance of increasing and maintaining water intake.   

## 2023-05-12 ENCOUNTER — Ambulatory Visit (INDEPENDENT_AMBULATORY_CARE_PROVIDER_SITE_OTHER): Payer: 59 | Admitting: Family

## 2023-05-12 VITALS — BP 120/79 | Ht 64.0 in | Wt 269.6 lb

## 2023-05-12 DIAGNOSIS — F319 Bipolar disorder, unspecified: Secondary | ICD-10-CM | POA: Diagnosis not present

## 2023-05-12 DIAGNOSIS — Z202 Contact with and (suspected) exposure to infections with a predominantly sexual mode of transmission: Secondary | ICD-10-CM | POA: Diagnosis not present

## 2023-05-12 DIAGNOSIS — A539 Syphilis, unspecified: Secondary | ICD-10-CM

## 2023-05-12 DIAGNOSIS — F339 Major depressive disorder, recurrent, unspecified: Secondary | ICD-10-CM

## 2023-05-12 MED ORDER — ESOMEPRAZOLE MAGNESIUM 40 MG PO CPDR: 40.0000 mg | DELAYED_RELEASE_CAPSULE | Freq: Every day | ORAL | 1 refills | Status: DC

## 2023-05-12 MED ORDER — BUSPIRONE HCL 10 MG PO TABS: 10.0000 mg | ORAL_TABLET | Freq: Two times a day (BID) | ORAL | 1 refills | Status: DC

## 2023-05-12 MED ORDER — FERROUS SULFATE 325 (65 FE) MG PO TABS: 325.0000 mg | ORAL_TABLET | Freq: Every day | ORAL | 1 refills | Status: DC

## 2023-05-12 MED ORDER — BUPROPION HCL ER (XL) 300 MG PO TB24: 300.0000 mg | ORAL_TABLET | Freq: Every day | ORAL | 1 refills | Status: DC

## 2023-05-12 MED ORDER — ARIPIPRAZOLE 5 MG PO TABS: 5.0000 mg | ORAL_TABLET | Freq: Every day | ORAL | 1 refills | Status: DC

## 2023-05-12 MED ORDER — VITAMIN D (ERGOCALCIFEROL) 1.25 MG (50000 UNIT) PO CAPS: 50000.0000 [IU] | ORAL_CAPSULE | ORAL | 3 refills | Status: DC

## 2023-05-12 NOTE — Patient Instructions (Addendum)
Folic acid supplement - 0.4 mcg tablets  Ovulation tests on [employer] - use around 3-7 days AFTER you finish a period.

## 2023-05-13 LAB — RPR W/REFLEX TO TREPSURE: RPR: REACTIVE — AB

## 2023-05-13 LAB — RPR, QUANT: RPR, Quant: 1:8 {titer} — ABNORMAL HIGH

## 2023-05-17 LAB — NUSWAB VG+, HSV
Atopobium vaginae: HIGH {score} — AB
Candida albicans, NAA: NEGATIVE
Candida glabrata, NAA: NEGATIVE
Chlamydia trachomatis, NAA: NEGATIVE
HSV 1 NAA: NEGATIVE
HSV 2 NAA: NEGATIVE
Neisseria gonorrhoeae, NAA: NEGATIVE
Trich vag by NAA: NEGATIVE

## 2023-05-20 ENCOUNTER — Other Ambulatory Visit: Payer: Self-pay

## 2023-05-20 DIAGNOSIS — Z5941 Food insecurity: Secondary | ICD-10-CM | POA: Diagnosis not present

## 2023-05-20 DIAGNOSIS — S0990XA Unspecified injury of head, initial encounter: Secondary | ICD-10-CM | POA: Diagnosis not present

## 2023-05-20 DIAGNOSIS — M545 Low back pain, unspecified: Secondary | ICD-10-CM | POA: Diagnosis not present

## 2023-05-20 DIAGNOSIS — R519 Headache, unspecified: Secondary | ICD-10-CM | POA: Diagnosis not present

## 2023-05-20 MED ORDER — METRONIDAZOLE 500 MG PO TABS: 500.0000 mg | ORAL_TABLET | Freq: Two times a day (BID) | ORAL | 0 refills | Status: AC

## 2023-05-29 ENCOUNTER — Encounter: Payer: Self-pay | Admitting: Family

## 2023-05-29 ENCOUNTER — Other Ambulatory Visit: Payer: Self-pay | Admitting: Family

## 2023-05-29 NOTE — Assessment & Plan Note (Signed)
Patient stable.  Well controlled with current therapy.   Continue current meds.

## 2023-05-29 NOTE — Assessment & Plan Note (Signed)
Checking labs today.  Continue current therapy for lipid control. Will modify as needed based on labwork results.  

## 2023-05-29 NOTE — Progress Notes (Signed)
Established Patient Office Visit  Subjective:  Patient ID: Julie Mcclain, female    DOB: 1996/08/28  Age: 27 y.o. MRN: [id number]  Chief Complaint  Patient presents with   Follow-up    1 MO F/U    Patient is here today for her 1 month follow up.  She has been feeling well since last appointment.   She does have additional concerns to discuss today.  She asks if she does decide to have children if there is anything specific she needs to do to prepare for this.   Labs are due today. She needs refills.   I have reviewed her active problem list, medication list, allergies, notes from last encounter, lab results for her appointment today.      No other concerns at this time.   Past Medical History:  Diagnosis Date   IDA (iron deficiency anemia)    Menometrorrhagia    Obesity    Ovarian cyst    Snoring     Past Surgical History:  Procedure Laterality Date   denies      Social History   Socioeconomic History   Marital status: Single    Spouse name: Not on file   Number of children: 0   Years of education: 20   Highest education level: Not on file  Occupational History   Occupation: unemployed  Tobacco Use   Smoking status: Never    Passive exposure: Never   Smokeless tobacco: Never  Vaping Use   Vaping status: Never Used  Substance and Sexual Activity   Alcohol use: No   Drug use: No   Sexual activity: Yes    Birth control/protection: None  Other Topics Concern   Not on file  Social History Narrative   Not on file   Social Determinants of Health   Financial Resource Strain: Low Risk  (01/06/2022)   Overall Financial Resource Strain (CARDIA)    Difficulty of Paying Living Expenses: Not hard at all  Food Insecurity: Food Insecurity Present (01/06/2022)   Hunger Vital Sign    Worried About Running Out of Food in the Last Year: Sometimes true    Ran Out of Food in the Last Year: Sometimes true  Transportation Needs: Unmet Transportation Needs  (01/06/2022)   PRAPARE - Administrator, Civil Service (Medical): Yes    Lack of Transportation (Non-Medical): No  Physical Activity: Insufficiently Active (01/06/2022)   Exercise Vital Sign    Days of Exercise per Week: 1 day    Minutes of Exercise per Session: 30 min  Stress: No Stress Concern Present (01/06/2022)   [employer] of Occupational Health - Occupational Stress Questionnaire    Feeling of Stress : Only a little  Social Connections: Moderately Isolated (01/06/2022)   Social Connection and Isolation Panel [NHANES]    Frequency of Communication with Friends and Family: More than three times a week    Frequency of Social Gatherings with Friends and Family: More than three times a week    Attends Religious Services: More than 4 times per year    Active Member of [employer] or Organizations: No    Attends Banker Meetings: Never    Marital Status: Never married  Intimate Partner Violence: Not At Risk (07/14/2022)   Humiliation, Afraid, Rape, and Kick questionnaire    Fear of Current or Ex-Partner: No    Emotionally Abused: No    Physically Abused: No    Sexually Abused: No    Family  History  Problem Relation Age of Onset   Diabetes Father        blind in one eye    No Known Allergies  Review of Systems  All other systems reviewed and are negative.      Objective:   BP 120/79   Ht 5\' 4"  (1.626 m)   Wt 269 lb 9.6 oz (122.3 kg)   BMI 46.28 kg/m   Vitals:   05/12/23 1509  BP: 120/79  Height: 5\' 4"  (1.626 m)  Weight: 269 lb 9.6 oz (122.3 kg)  BMI (Calculated): 46.25    Physical Exam Vitals and nursing note reviewed.  Constitutional:      Appearance: Normal appearance. She is normal weight.  HENT:     Head: Normocephalic.  Eyes:     Pupils: Pupils are equal, round, and reactive to light.  Cardiovascular:     Rate and Rhythm: Normal rate.  Pulmonary:     Effort: Pulmonary effort is normal.  Neurological:     General: No focal  deficit present.     Mental Status: She is alert and oriented to person, place, and time. Mental status is at baseline.  Psychiatric:        Attention and Perception: Attention and perception normal.        Mood and Affect: Mood and affect normal.        Speech: Speech normal.        Behavior: Behavior normal. Behavior is cooperative.        Thought Content: Thought content normal.        Cognition and Memory: Cognition normal.        Judgment: Judgment is impulsive.      Results for orders placed or performed in visit on 05/12/23  RPR w/reflex to TrepSure  Result Value Ref Range   RPR Reactive (A) Non Reactive  RPR, Quant  Result Value Ref Range   RPR, Quant 1:8 (H) NonRea<1:1 titer  NuSwab VG+, HSV  Result Value Ref Range   Atopobium vaginae High - 2 (A) Score   BVAB 2 Low - 0 Score   Megasphaera 1 Low - 0 Score   Candida albicans, NAA Negative Negative   Candida glabrata, NAA Negative Negative   Trich vag by NAA Negative Negative   Chlamydia trachomatis, NAA Negative Negative   Neisseria gonorrhoeae, NAA Negative Negative   HSV 1 NAA Negative Negative   HSV 2 NAA Negative Negative    Recent Results (from the past 2160 hour(s))  RPR w/reflex to TrepSure     Status: Abnormal   Collection Time: 03/01/23 11:48 AM  Result Value Ref Range   RPR Reactive (A) Non Reactive  HIV Antibody (routine testing w rflx)     Status: None   Collection Time: 03/01/23 11:48 AM  Result Value Ref Range   HIV Screen 4th Generation wRfx Non Reactive Non Reactive    Comment: HIV-1/HIV-2 antibodies and HIV-1 p24 antigen were NOT detected. There is no laboratory evidence of HIV infection. HIV Negative   RPR, Quant     Status: Abnormal   Collection Time: 03/01/23 11:48 AM  Result Value Ref Range   RPR, Quant 1:4 (H) NonRea<1:1 titer    Comment: This test is intended ONLY for specimens that have tested positive (reactive) or equivocal for Treponema pallidum antibodies prior to submission  for testing. For the full CDC-recommended syphilis screening and diagnosis algorithm, [facility] offers test code 012005 RPR, Rfx Qn RPR/Confirm TP or [id number] T  pallidum Screening Cascade.   NuSwab Vaginitis Plus (VG+)     Status: None   Collection Time: 03/01/23 12:12 PM  Result Value Ref Range   Atopobium vaginae Moderate - 1 Score   BVAB 2 Low - 0 Score   Megasphaera 1 Low - 0 Score    Comment: Calculate total score by adding the 3 individual bacterial vaginosis (BV) marker scores together.  Total score is interpreted as follows: Total score 0-1: Indicates the absence of BV. Total score   2: Indeterminate for BV. Additional clinical                  data should be evaluated to establish a                  diagnosis. Total score 3-6: Indicates the presence of BV.    Candida albicans, NAA Negative Negative   Candida glabrata, NAA Negative Negative   Trich vag by NAA Negative Negative   Chlamydia trachomatis, NAA Negative Negative   Neisseria gonorrhoeae, NAA Negative Negative  POCT Urinalysis Dipstick (16109)     Status: Abnormal   Collection Time: 03/31/23  2:01 PM  Result Value Ref Range   Color, UA yellow    Clarity, UA clear    Glucose, UA Negative Negative   Bilirubin, UA negative    Ketones, UA negative    Spec Grav, UA >=1.030 (A) 1.010 - 1.025   Blood, UA negative    pH, UA 6.0 5.0 - 8.0   Protein, UA Positive (A) Negative   Urobilinogen, UA 0.2 0.2 or 1.0 E.U./dL   Nitrite, UA negative    Leukocytes, UA Negative Negative   Appearance     Odor    RPR w/reflex to TrepSure     Status: Abnormal   Collection Time: 05/12/23  4:03 PM  Result Value Ref Range   RPR Reactive (A) Non Reactive  RPR, Quant     Status: Abnormal   Collection Time: 05/12/23  4:03 PM  Result Value Ref Range   RPR, Quant 1:8 (H) NonRea<1:1 titer    Comment: This test is intended ONLY for specimens that have tested positive (reactive) or equivocal for Treponema pallidum antibodies prior  to submission for testing. For the full CDC-recommended syphilis screening and diagnosis algorithm, [facility] offers test code 012005 RPR, Rfx Qn RPR/Confirm TP or 604540 T pallidum Screening Cascade.   NuSwab VG+, HSV     Status: Abnormal   Collection Time: 05/12/23  4:09 PM  Result Value Ref Range   Atopobium vaginae High - 2 (A) Score   BVAB 2 Low - 0 Score   Megasphaera 1 Low - 0 Score    Comment: Calculate total score by adding the 3 individual bacterial vaginosis (BV) marker scores together.  Total score is interpreted as follows: Total score 0-1: Indicates the absence of BV. Total score   2: Indeterminate for BV. Additional clinical                  data should be evaluated to establish a                  diagnosis. Total score 3-6: Indicates the presence of BV.    Candida albicans, NAA Negative Negative   Candida glabrata, NAA Negative Negative   Trich vag by NAA Negative Negative   Chlamydia trachomatis, NAA Negative Negative   Neisseria gonorrhoeae, NAA Negative Negative   HSV 1 NAA Negative Negative   HSV  2 NAA Negative Negative       Assessment & Plan:   Problem List Items Addressed This Visit       Active Problems   Bipolar 1 disorder (HCC)    Patient stable.  Well controlled with current therapy.   Continue current meds.       Depression, recurrent (HCC)    Patient stable.  Well controlled with current therapy.   Continue current meds.       Relevant Medications   buPROPion (WELLBUTRIN XL) 300 MG 24 hr tablet   busPIRone (BUSPAR) 10 MG tablet   Obesity, Class III, BMI 40-49.9 (morbid obesity) (HCC)    Continue current meds.  Will adjust as needed based on results.  The patient is asked to make an attempt to improve diet and exercise patterns to aid in medical management of this problem. Addressed importance of increasing and maintaining water intake.       Other Visit Diagnoses     Syphilis (acquired)    -  Primary   Rechecking levels  today.   Relevant Orders   RPR w/reflex to TrepSure (Completed)   Contact with and (suspected) exposure to infections with a predominantly sexual mode of transmission       Patient asks to be rechecked, thinks she may have a yeast infection.    Will send swab to check her for BV as well as other STIs.   Relevant Orders   NuSwab VG+, HSV (Completed)       Return in about 1 month (around 06/12/2023) for F/U.   Total time spent: 20 minutes  Miki Kins, FNP  05/12/2023   This document may have been prepared by [facility] Voice Recognition software and as such may include unintentional dictation errors.

## 2023-05-29 NOTE — Assessment & Plan Note (Signed)
Patient stable.  Well controlled with current therapy.   Continue current meds.  

## 2023-05-29 NOTE — Assessment & Plan Note (Signed)
Continue current meds.  Will adjust as needed based on results.  The patient is asked to make an attempt to improve diet and exercise patterns to aid in medical management of this problem. Addressed importance of increasing and maintaining water intake.

## 2023-06-02 ENCOUNTER — Ambulatory Visit: Payer: 59 | Admitting: Family

## 2023-06-07 ENCOUNTER — Ambulatory Visit: Payer: 59 | Admitting: Family

## 2023-06-13 ENCOUNTER — Ambulatory Visit (INDEPENDENT_AMBULATORY_CARE_PROVIDER_SITE_OTHER): Payer: 59 | Admitting: Family

## 2023-06-13 VITALS — BP 130/90 | HR 104 | Ht 64.0 in | Wt 274.6 lb

## 2023-06-13 DIAGNOSIS — F319 Bipolar disorder, unspecified: Secondary | ICD-10-CM

## 2023-06-13 DIAGNOSIS — E782 Mixed hyperlipidemia: Secondary | ICD-10-CM

## 2023-06-13 DIAGNOSIS — E538 Deficiency of other specified B group vitamins: Secondary | ICD-10-CM | POA: Diagnosis not present

## 2023-06-13 DIAGNOSIS — R5383 Other fatigue: Secondary | ICD-10-CM

## 2023-06-13 DIAGNOSIS — R7303 Prediabetes: Secondary | ICD-10-CM

## 2023-06-13 DIAGNOSIS — D500 Iron deficiency anemia secondary to blood loss (chronic): Secondary | ICD-10-CM

## 2023-06-13 DIAGNOSIS — E559 Vitamin D deficiency, unspecified: Secondary | ICD-10-CM

## 2023-06-13 DIAGNOSIS — A539 Syphilis, unspecified: Secondary | ICD-10-CM

## 2023-06-14 ENCOUNTER — Ambulatory Visit: Payer: 59 | Admitting: Family

## 2023-06-14 LAB — TSH: TSH: 2.11 u[IU]/mL (ref 0.450–4.500)

## 2023-06-14 LAB — CMP14+EGFR
ALT: 10 [IU]/L (ref 0–32)
AST: 14 [IU]/L (ref 0–40)
Albumin: 4.1 g/dL (ref 4.0–5.0)
Alkaline Phosphatase: 82 [IU]/L (ref 44–121)
BUN/Creatinine Ratio: 12 (ref 9–23)
BUN: 11 mg/dL (ref 6–20)
Bilirubin Total: 0.2 mg/dL (ref 0.0–1.2)
CO2: 23 mmol/L (ref 20–29)
Calcium: 9 mg/dL (ref 8.7–10.2)
Chloride: 104 mmol/L (ref 96–106)
Creatinine, Ser: 0.89 mg/dL (ref 0.57–1.00)
Globulin, Total: 3.1 g/dL (ref 1.5–4.5)
Glucose: 79 mg/dL (ref 70–99)
Potassium: 4.1 mmol/L (ref 3.5–5.2)
Sodium: 139 mmol/L (ref 134–144)
Total Protein: 7.2 g/dL (ref 6.0–8.5)
eGFR: 91 mL/min/{1.73_m2} (ref >59)

## 2023-06-14 LAB — IRON,TIBC AND FERRITIN PANEL
Ferritin: 3 ng/mL — ABNORMAL LOW (ref 15–150)
Iron Saturation: 3 % — CL (ref 15–55)
Iron: 12 ug/dL — ABNORMAL LOW (ref 27–159)
Total Iron Binding Capacity: 433 ug/dL (ref 250–450)
UIBC: 421 ug/dL (ref 131–425)

## 2023-06-14 LAB — VITAMIN D 25 HYDROXY (VIT D DEFICIENCY, FRACTURES): Vit D, 25-Hydroxy: 46.5 ng/mL (ref 30.0–100.0)

## 2023-06-14 LAB — HEMOGLOBIN A1C
Est. average glucose Bld gHb Est-mCnc: 117 mg/dL
Hgb A1c MFr Bld: 5.7 % — ABNORMAL HIGH (ref 4.8–5.6)

## 2023-06-14 LAB — RPR W/REFLEX TO TREPSURE: RPR: REACTIVE — AB

## 2023-06-14 LAB — LIPID PANEL
Chol/HDL Ratio: 3.7 {ratio} (ref 0.0–4.4)
Cholesterol, Total: 209 mg/dL — ABNORMAL HIGH (ref 100–199)
HDL: 56 mg/dL (ref >39)
LDL Chol Calc (NIH): 123 mg/dL — ABNORMAL HIGH (ref 0–99)
Triglycerides: 169 mg/dL — ABNORMAL HIGH (ref 0–149)
VLDL Cholesterol Cal: 30 mg/dL (ref 5–40)

## 2023-06-14 LAB — RPR, QUANT: RPR, Quant: 1:8 {titer} — ABNORMAL HIGH

## 2023-06-14 LAB — VITAMIN B12: Vitamin B-12: 480 pg/mL (ref 232–1245)

## 2023-06-17 ENCOUNTER — Encounter: Payer: Self-pay | Admitting: Family

## 2023-06-17 NOTE — Progress Notes (Signed)
Patient notified and verbalized understanding. 

## 2023-06-17 NOTE — Progress Notes (Signed)
Established Patient Office Visit  Subjective:  Patient ID: Julie Mcclain, female    DOB: 06/27/1996  Age: 27 y.o. MRN: [id number]  Chief Complaint  Patient presents with   Follow-up    1 mo f/u    Patient is here today for her 1 month follow up.  She has been feeling fairly well since last appointment.   She does have additional concerns to discuss today.  She has been very tired, says that she is getting short of breath at times, and that she will frequently have to sit in the middle of activities.  Has not had any recent illnesses or other issues that would explain.   Labs are due today. She needs refills.   I have reviewed her active problem list, medication list, allergies, notes from last encounter, lab results for her appointment today.      No other concerns at this time.   Past Medical History:  Diagnosis Date   IDA (iron deficiency anemia)    Menometrorrhagia    Obesity    Ovarian cyst    Snoring     Past Surgical History:  Procedure Laterality Date   denies      Social History   Socioeconomic History   Marital status: Single    Spouse name: Not on file   Number of children: 0   Years of education: 34   Highest education level: Not on file  Occupational History   Occupation: unemployed  Tobacco Use   Smoking status: Never    Passive exposure: Never   Smokeless tobacco: Never  Vaping Use   Vaping status: Never Used  Substance and Sexual Activity   Alcohol use: No   Drug use: No   Sexual activity: Yes    Birth control/protection: None  Other Topics Concern   Not on file  Social History Narrative   Not on file   Social Determinants of Health   Financial Resource Strain: Low Risk  (01/06/2022)   Overall Financial Resource Strain (CARDIA)    Difficulty of Paying Living Expenses: Not hard at all  Food Insecurity: Food Insecurity Present (01/06/2022)   Hunger Vital Sign    Worried About Running Out of Food in the Last Year:  Sometimes true    Ran Out of Food in the Last Year: Sometimes true  Transportation Needs: Unmet Transportation Needs (01/06/2022)   PRAPARE - Administrator, Civil Service (Medical): Yes    Lack of Transportation (Non-Medical): No  Physical Activity: Insufficiently Active (01/06/2022)   Exercise Vital Sign    Days of Exercise per Week: 1 day    Minutes of Exercise per Session: 30 min  Stress: No Stress Concern Present (01/06/2022)   [employer] of Occupational Health - Occupational Stress Questionnaire    Feeling of Stress : Only a little  Social Connections: Moderately Isolated (01/06/2022)   Social Connection and Isolation Panel [NHANES]    Frequency of Communication with Friends and Family: More than three times a week    Frequency of Social Gatherings with Friends and Family: More than three times a week    Attends Religious Services: More than 4 times per year    Active Member of [employer] or Organizations: No    Attends Banker Meetings: Never    Marital Status: Never married  Intimate Partner Violence: Not At Risk (07/14/2022)   Humiliation, Afraid, Rape, and Kick questionnaire    Fear of Current or Ex-Partner: No  Emotionally Abused: No    Physically Abused: No    Sexually Abused: No    Family History  Problem Relation Age of Onset   Diabetes Father        blind in one eye    No Known Allergies  Review of Systems  Constitutional:  Positive for malaise/fatigue.  Respiratory:  Positive for shortness of breath and wheezing.   All other systems reviewed and are negative.      Objective:   BP (!) 130/90   Pulse (!) 104   Ht 5\' 4"  (1.626 m)   Wt 274 lb 9.6 oz (124.6 kg)   SpO2 98%   BMI 47.13 kg/m   Vitals:   06/13/23 1526  BP: (!) 130/90  Pulse: (!) 104  Height: 5\' 4"  (1.626 m)  Weight: 274 lb 9.6 oz (124.6 kg)  SpO2: 98%  BMI (Calculated): 47.11    Physical Exam Vitals and nursing note reviewed.  Constitutional:       Appearance: Normal appearance. She is normal weight.  HENT:     Head: Normocephalic.  Eyes:     Pupils: Pupils are equal, round, and reactive to light.  Cardiovascular:     Rate and Rhythm: Normal rate.  Pulmonary:     Effort: Pulmonary effort is normal.     Breath sounds: Normal breath sounds. No wheezing or rales.  Neurological:     General: No focal deficit present.     Mental Status: She is alert and oriented to person, place, and time. Mental status is at baseline.  Psychiatric:        Mood and Affect: Mood normal.        Behavior: Behavior normal.        Thought Content: Thought content normal.        Judgment: Judgment normal.      Results for orders placed or performed in visit on 06/13/23  Lipid panel  Result Value Ref Range   Cholesterol, Total 209 (H) 100 - 199 mg/dL   Triglycerides 161 (H) 0 - 149 mg/dL   HDL 56 >09 mg/dL   VLDL Cholesterol Cal 30 5 - 40 mg/dL   LDL Chol Calc (NIH) 604 (H) 0 - 99 mg/dL   Chol/HDL Ratio 3.7 0.0 - 4.4 ratio  VITAMIN D 25 Hydroxy (Vit-D Deficiency, Fractures)  Result Value Ref Range   Vit D, 25-Hydroxy 46.5 30.0 - 100.0 ng/mL  CMP14+EGFR  Result Value Ref Range   Glucose 79 70 - 99 mg/dL   BUN 11 6 - 20 mg/dL   Creatinine, Ser 5.40 0.57 - 1.00 mg/dL   eGFR 91 >98 JX/BJY/7.82   BUN/Creatinine Ratio 12 9 - 23   Sodium 139 134 - 144 mmol/L   Potassium 4.1 3.5 - 5.2 mmol/L   Chloride 104 96 - 106 mmol/L   CO2 23 20 - 29 mmol/L   Calcium 9.0 8.7 - 10.2 mg/dL   Total Protein 7.2 6.0 - 8.5 g/dL   Albumin 4.1 4.0 - 5.0 g/dL   Globulin, Total 3.1 1.5 - 4.5 g/dL   Bilirubin Total 0.2 0.0 - 1.2 mg/dL   Alkaline Phosphatase 82 44 - 121 IU/L   AST 14 0 - 40 IU/L   ALT 10 0 - 32 IU/L  TSH  Result Value Ref Range   TSH 2.110 0.450 - 4.500 uIU/mL  Hemoglobin A1c  Result Value Ref Range   Hgb A1c MFr Bld 5.7 (H) 4.8 - 5.6 %   Est.  average glucose Bld gHb Est-mCnc 117 mg/dL  Vitamin I69  Result Value Ref Range   Vitamin B-12 480  232 - 1,245 pg/mL  Iron, TIBC and Ferritin Panel  Result Value Ref Range   Total Iron Binding Capacity 433 250 - 450 ug/dL   UIBC 629 528 - 413 ug/dL   Iron 12 (L) 27 - 244 ug/dL   Iron Saturation 3 (LL) 15 - 55 %   Ferritin 3 (L) 15 - 150 ng/mL  RPR w/reflex to TrepSure  Result Value Ref Range   RPR Reactive (A) Non Reactive  RPR, Quant  Result Value Ref Range   RPR, Quant 1:8 (H) NonRea<1:1 titer    Recent Results (from the past 2160 hour(s))  POCT Urinalysis Dipstick (01027)     Status: Abnormal   Collection Time: 03/31/23  2:01 PM  Result Value Ref Range   Color, UA yellow    Clarity, UA clear    Glucose, UA Negative Negative   Bilirubin, UA negative    Ketones, UA negative    Spec Grav, UA >=1.030 (A) 1.010 - 1.025   Blood, UA negative    pH, UA 6.0 5.0 - 8.0   Protein, UA Positive (A) Negative   Urobilinogen, UA 0.2 0.2 or 1.0 E.U./dL   Nitrite, UA negative    Leukocytes, UA Negative Negative   Appearance     Odor    RPR w/reflex to TrepSure     Status: Abnormal   Collection Time: 05/12/23  4:03 PM  Result Value Ref Range   RPR Reactive (A) Non Reactive  RPR, Quant     Status: Abnormal   Collection Time: 05/12/23  4:03 PM  Result Value Ref Range   RPR, Quant 1:8 (H) NonRea<1:1 titer    Comment: This test is intended ONLY for specimens that have tested positive (reactive) or equivocal for Treponema pallidum antibodies prior to submission for testing. For the full CDC-recommended syphilis screening and diagnosis algorithm, [facility] offers test code 012005 RPR, Rfx Qn RPR/Confirm TP or 253664 T pallidum Screening Cascade.   NuSwab VG+, HSV     Status: Abnormal   Collection Time: 05/12/23  4:09 PM  Result Value Ref Range   Atopobium vaginae High - 2 (A) Score   BVAB 2 Low - 0 Score   Megasphaera 1 Low - 0 Score    Comment: Calculate total score by adding the 3 individual bacterial vaginosis (BV) marker scores together.  Total score is interpreted as  follows: Total score 0-1: Indicates the absence of BV. Total score   2: Indeterminate for BV. Additional clinical                  data should be evaluated to establish a                  diagnosis. Total score 3-6: Indicates the presence of BV.    Candida albicans, NAA Negative Negative   Candida glabrata, NAA Negative Negative   Trich vag by NAA Negative Negative   Chlamydia trachomatis, NAA Negative Negative   Neisseria gonorrhoeae, NAA Negative Negative   HSV 1 NAA Negative Negative   HSV 2 NAA Negative Negative  Lipid panel     Status: Abnormal   Collection Time: 06/13/23  3:52 PM  Result Value Ref Range   Cholesterol, Total 209 (H) 100 - 199 mg/dL   Triglycerides 403 (H) 0 - 149 mg/dL   HDL 56 >47 mg/dL   VLDL Cholesterol  Cal 30 5 - 40 mg/dL   LDL Chol Calc (NIH) 161 (H) 0 - 99 mg/dL   Chol/HDL Ratio 3.7 0.0 - 4.4 ratio    Comment:                                   T. Chol/HDL Ratio                                             Men  Women                               1/2 Avg.Risk  3.4    3.3                                   Avg.Risk  5.0    4.4                                2X Avg.Risk  9.6    7.1                                3X Avg.Risk 23.4   11.0   VITAMIN D 25 Hydroxy (Vit-D Deficiency, Fractures)     Status: None   Collection Time: 06/13/23  3:52 PM  Result Value Ref Range   Vit D, 25-Hydroxy 46.5 30.0 - 100.0 ng/mL    Comment: Vitamin D deficiency has been defined by the Institute of Medicine and an Endocrine Society practice guideline as a level of serum 25-OH vitamin D less than 20 ng/mL (1,2). The Endocrine Society went on to further define vitamin D insufficiency as a level between 21 and 29 ng/mL (2). 1. IOM (Institute of Medicine). 2010. Dietary reference    intakes for calcium and D. [place]: The    [employer]. 2. Holick MF, Binkley [place], Bischoff-Ferrari HA, et al.    Evaluation, treatment, and prevention of vitamin D    deficiency:  an Endocrine Society clinical practice    guideline. JCEM. 2011 Jul; 96(7):1911-30.   CMP14+EGFR     Status: None   Collection Time: 06/13/23  3:52 PM  Result Value Ref Range   Glucose 79 70 - 99 mg/dL   BUN 11 6 - 20 mg/dL   Creatinine, Ser 0.96 0.57 - 1.00 mg/dL   eGFR 91 >04 VW/UJW/1.19   BUN/Creatinine Ratio 12 9 - 23   Sodium 139 134 - 144 mmol/L   Potassium 4.1 3.5 - 5.2 mmol/L   Chloride 104 96 - 106 mmol/L   CO2 23 20 - 29 mmol/L   Calcium 9.0 8.7 - 10.2 mg/dL   Total Protein 7.2 6.0 - 8.5 g/dL   Albumin 4.1 4.0 - 5.0 g/dL   Globulin, Total 3.1 1.5 - 4.5 g/dL   Bilirubin Total 0.2 0.0 - 1.2 mg/dL   Alkaline Phosphatase 82 44 - 121 IU/L   AST 14 0 - 40 IU/L   ALT 10 0 - 32 IU/L  TSH     Status: None   Collection Time: 06/13/23  3:52  PM  Result Value Ref Range   TSH 2.110 0.450 - 4.500 uIU/mL  Hemoglobin A1c     Status: Abnormal   Collection Time: 06/13/23  3:52 PM  Result Value Ref Range   Hgb A1c MFr Bld 5.7 (H) 4.8 - 5.6 %    Comment:          Prediabetes: 5.7 - 6.4          Diabetes: >6.4          Glycemic control for adults with diabetes: <7.0    Est. average glucose Bld gHb Est-mCnc 117 mg/dL  Vitamin Y60     Status: None   Collection Time: 06/13/23  3:52 PM  Result Value Ref Range   Vitamin B-12 480 232 - 1,245 pg/mL  Iron, TIBC and Ferritin Panel     Status: Abnormal   Collection Time: 06/13/23  3:52 PM  Result Value Ref Range   Total Iron Binding Capacity 433 250 - 450 ug/dL   UIBC 630 160 - 109 ug/dL   Iron 12 (L) 27 - 323 ug/dL   Iron Saturation 3 (LL) 15 - 55 %   Ferritin 3 (L) 15 - 150 ng/mL  RPR w/reflex to TrepSure     Status: Abnormal   Collection Time: 06/13/23  3:52 PM  Result Value Ref Range   RPR Reactive (A) Non Reactive  RPR, Quant     Status: Abnormal   Collection Time: 06/13/23  3:52 PM  Result Value Ref Range   RPR, Quant 1:8 (H) NonRea<1:1 titer    Comment: This test is intended ONLY for specimens that have tested  positive (reactive) or equivocal for Treponema pallidum antibodies prior to submission for testing. For the full CDC-recommended syphilis screening and diagnosis algorithm, [facility] offers test code 012005 RPR, Rfx Qn RPR/Confirm TP or 557322 T pallidum Screening Cascade.        Assessment & Plan:   Problem List Items Addressed This Visit       Active Problems   Iron deficiency anemia due to chronic blood loss   Relevant Orders   CMP14+EGFR (Completed)   CBC with Differential/Platelet   Iron, TIBC and Ferritin Panel (Completed)   Bipolar 1 disorder (HCC)   Relevant Orders   CMP14+EGFR (Completed)   CBC with Differential/Platelet   Obesity, Class III, BMI 40-49.9 (morbid obesity) (HCC)   Relevant Orders   CMP14+EGFR (Completed)   CBC with Differential/Platelet   Hyperlipidemia   Relevant Orders   Lipid panel (Completed)   CMP14+EGFR (Completed)   CBC with Differential/Platelet   Prediabetes - Primary   Relevant Orders   CMP14+EGFR (Completed)   Hemoglobin A1c (Completed)   CBC with Differential/Platelet   Other Visit Diagnoses     B12 deficiency due to diet       Relevant Orders   CMP14+EGFR (Completed)   Vitamin B12 (Completed)   CBC with Differential/Platelet   Vitamin D deficiency, unspecified       Relevant Orders   VITAMIN D 25 Hydroxy (Vit-D Deficiency, Fractures) (Completed)   CMP14+EGFR (Completed)   CBC with Differential/Platelet   Other fatigue       Relevant Orders   CMP14+EGFR (Completed)   TSH (Completed)   CBC with Differential/Platelet   Iron, TIBC and Ferritin Panel (Completed)   Syphilis (acquired)       Relevant Orders   CMP14+EGFR (Completed)   CBC with Differential/Platelet   RPR w/reflex to TrepSure (Completed)       No  follow-ups on file.   Total time spent: 30 minutes  Miki Kins, FNP  06/13/2023   This document may have been prepared by [facility] Voice Recognition software and as such may include unintentional  dictation errors.

## 2023-06-22 ENCOUNTER — Inpatient Hospital Stay: Payer: 59

## 2023-06-22 ENCOUNTER — Inpatient Hospital Stay: Payer: 59 | Attending: Oncology | Admitting: Oncology

## 2023-06-22 ENCOUNTER — Encounter: Payer: Self-pay | Admitting: Oncology

## 2023-06-22 VITALS — BP 130/93 | HR 112 | Temp 96.5°F | Resp 18 | Ht 64.0 in | Wt 276.8 lb

## 2023-06-22 DIAGNOSIS — N920 Excessive and frequent menstruation with regular cycle: Secondary | ICD-10-CM | POA: Diagnosis not present

## 2023-06-22 DIAGNOSIS — D509 Iron deficiency anemia, unspecified: Secondary | ICD-10-CM

## 2023-06-22 LAB — CBC WITH DIFFERENTIAL/PLATELET
Abs Immature Granulocytes: 0.03 10*3/uL (ref 0.00–0.07)
Basophils Absolute: 0 10*3/uL (ref 0.0–0.1)
Basophils Relative: 0 %
Eosinophils Absolute: 0.2 10*3/uL (ref 0.0–0.5)
Eosinophils Relative: 2 %
HCT: 30.4 % — ABNORMAL LOW (ref 36.0–46.0)
Hemoglobin: 8.7 g/dL — ABNORMAL LOW (ref 12.0–15.0)
Immature Granulocytes: 0 %
Lymphocytes Relative: 24 %
Lymphs Abs: 2.4 10*3/uL (ref 0.7–4.0)
MCH: 21.6 pg — ABNORMAL LOW (ref 26.0–34.0)
MCHC: 28.6 g/dL — ABNORMAL LOW (ref 30.0–36.0)
MCV: 75.4 fL — ABNORMAL LOW (ref 80.0–100.0)
Monocytes Absolute: 0.6 10*3/uL (ref 0.1–1.0)
Monocytes Relative: 7 %
Neutro Abs: 6.5 10*3/uL (ref 1.7–7.7)
Neutrophils Relative %: 67 %
Platelets: 365 10*3/uL (ref 150–400)
RBC: 4.03 MIL/uL (ref 3.87–5.11)
RDW: 17.1 % — ABNORMAL HIGH (ref 11.5–15.5)
WBC: 9.7 10*3/uL (ref 4.0–10.5)
nRBC: 0 % (ref 0.0–0.2)

## 2023-06-23 ENCOUNTER — Encounter: Payer: Self-pay | Admitting: Oncology

## 2023-06-23 NOTE — Progress Notes (Signed)
Hematology/Oncology Consult note [facility] [phone] [phone]  Patient Care Team: Miki Kins, FNP as PCP - General (Family Medicine) Earna Coder, MD as Consulting Physician (Hematology) Copland, Chucky May as Consulting Physician (Obstetrics and Gynecology) Creig Hines, MD as Consulting Physician (Oncology)   Name of the patient: Julie Mcclain  [id number]  02-02-1996    Reason for referral- iron deficiency    Referring physician- Grayling Congress FNP  Date of visit: 06/23/23   History of presenting illness-patient is a 27 year old female referred For iron deficiency.  Her labs from 06/13/2023 showed ferritin level of 3 with an iron saturation of 3% and elevated TIBC.  B12 levels were normal at 480.  CBC was not checked recently and TSH was normal at 2.1.  Patient reports ongoing fatigue as well as craving ice.  She has not been able to tolerate oral iron.  Denies any consistent use of NSAIDs.  Denies any dark melanotic stools or bright red blood in stools.  No family history of colon cancer.  She does report heavy menstrual cycles which can sometimes last for a week.  She has not seen GYN for her menorrhagia.  ECOG PS- 0  Pain scale- 0   Review of systems- Review of Systems  Constitutional:  Positive for malaise/fatigue. Negative for chills, fever and weight loss.  HENT:  Negative for congestion, ear discharge and nosebleeds.   Eyes:  Negative for blurred vision.  Respiratory:  Negative for cough, hemoptysis, sputum production, shortness of breath and wheezing.   Cardiovascular:  Negative for chest pain, palpitations, orthopnea and claudication.  Gastrointestinal:  Negative for abdominal pain, blood in stool, constipation, diarrhea, heartburn, melena, nausea and vomiting.  Genitourinary:  Negative for dysuria, flank pain, frequency, hematuria and urgency.  Musculoskeletal:  Negative for back pain, joint pain  and myalgias.  Skin:  Negative for rash.  Neurological:  Negative for dizziness, tingling, focal weakness, seizures, weakness and headaches.  Endo/Heme/Allergies:  Does not bruise/bleed easily.  Psychiatric/Behavioral:  Negative for depression and suicidal ideas. The patient does not have insomnia.     No Known Allergies  Patient Active Problem List   Diagnosis Date Noted   Gastritis 07/27/2022   Absolute anemia 07/27/2022   Obesity, Class III, BMI 40-49.9 (morbid obesity) (HCC) 07/27/2022   Abnormal urinalysis 07/27/2022   ADHD 07/14/2022   Bipolar 1 disorder (HCC) 07/14/2022   Anxiety 07/14/2022   Depression, recurrent (HCC) 07/14/2022   Generalized anxiety disorder 02/17/2022   Hyperlipidemia 02/17/2022   Prediabetes 02/17/2022   Supervision of high risk pregnancy, antepartum 01/06/2022   Generalized weakness 10/06/2021   Iron deficiency anemia due to chronic blood loss 05/04/2021   Menometrorrhagia 05/04/2021     Past Medical History:  Diagnosis Date   IDA (iron deficiency anemia)    Menometrorrhagia    Obesity    Ovarian cyst    Snoring      Past Surgical History:  Procedure Laterality Date   denies      Social History   Socioeconomic History   Marital status: Single    Spouse name: Not on file   Number of children: 0   Years of education: 12   Highest education level: Not on file  Occupational History   Occupation: unemployed  Tobacco Use   Smoking status: Never    Passive exposure: Never   Smokeless tobacco: Never  Vaping Use   Vaping status: Never Used  Substance and Sexual Activity  Alcohol use: No   Drug use: No   Sexual activity: Yes    Birth control/protection: None  Other Topics Concern   Not on file  Social History Narrative   Not on file   Social Determinants of Health   Financial Resource Strain: Low Risk  (01/06/2022)   Overall Financial Resource Strain (CARDIA)    Difficulty of Paying Living Expenses: Not hard at all  Food  Insecurity: No Food Insecurity (06/22/2023)   Hunger Vital Sign    Worried About Running Out of Food in the Last Year: Never true    Ran Out of Food in the Last Year: Never true  Transportation Needs: No Transportation Needs (06/22/2023)   PRAPARE - Administrator, Civil Service (Medical): No    Lack of Transportation (Non-Medical): No  Physical Activity: Insufficiently Active (01/06/2022)   Exercise Vital Sign    Days of Exercise per Week: 1 day    Minutes of Exercise per Session: 30 min  Stress: No Stress Concern Present (01/06/2022)   [employer] of Occupational Health - Occupational Stress Questionnaire    Feeling of Stress : Only a little  Social Connections: Moderately Isolated (01/06/2022)   Social Connection and Isolation Panel [NHANES]    Frequency of Communication with Friends and Family: More than three times a week    Frequency of Social Gatherings with Friends and Family: More than three times a week    Attends Religious Services: More than 4 times per year    Active Member of [employer] or Organizations: No    Attends Banker Meetings: Never    Marital Status: Never married  Intimate Partner Violence: Not At Risk (06/22/2023)   Humiliation, Afraid, Rape, and Kick questionnaire    Fear of Current or Ex-Partner: No    Emotionally Abused: No    Physically Abused: No    Sexually Abused: No     Family History  Problem Relation Age of Onset   Diabetes Father        blind in one eye     Current Outpatient Medications:    ARIPiprazole (ABILIFY) 5 MG tablet, Take 1 tablet (5 mg total) by mouth at bedtime., Disp: 90 tablet, Rfl: 1   buPROPion (WELLBUTRIN XL) 300 MG 24 hr tablet, Take 1 tablet (300 mg total) by mouth daily., Disp: 90 tablet, Rfl: 1   busPIRone (BUSPAR) 10 MG tablet, Take 1 tablet (10 mg total) by mouth 2 (two) times daily., Disp: 180 tablet, Rfl: 1   esomeprazole (NEXIUM) 40 MG capsule, Take 1 capsule (40 mg total) by mouth at bedtime.,  Disp: 90 capsule, Rfl: 1   ferrous sulfate (FEROSUL) 325 (65 FE) MG tablet, Take 1 tablet (325 mg total) by mouth daily with breakfast., Disp: 90 tablet, Rfl: 1   OZEMPIC, 2 MG/DOSE, 8 MG/3ML SOPN, INJECT 2 MG UNDER THE SKIN ONE DAY A WEEK, Disp: 3 mL, Rfl: 0   Vitamin D, Ergocalciferol, (DRISDOL) 1.25 MG (50000 UNIT) CAPS capsule, Take 1 capsule (50,000 Units total) by mouth every 7 (seven) days., Disp: 12 capsule, Rfl: 3   Physical exam:  Vitals:   06/22/23 1441  BP: (!) 130/93  Pulse: (!) 112  Resp: 18  Temp: (!) 96.5 F (35.8 C)  TempSrc: Tympanic  SpO2: 99%  Weight: 276 lb 12.8 oz (125.6 kg)  Height: 5\' 4"  (1.626 m)   Physical Exam Cardiovascular:     Rate and Rhythm: Normal rate and regular rhythm.  Heart sounds: Normal heart sounds.  Pulmonary:     Effort: Pulmonary effort is normal.     Breath sounds: Normal breath sounds.  Abdominal:     General: Bowel sounds are normal.     Palpations: Abdomen is soft.  Skin:    General: Skin is warm and dry.  Neurological:     Mental Status: She is alert and oriented to person, place, and time.           Latest Ref Rng & Units 06/13/2023    3:52 PM  CMP  Glucose 70 - 99 mg/dL 79   BUN 6 - 20 mg/dL 11   Creatinine 1.61 - 1.00 mg/dL 0.96   Sodium 045 - 409 mmol/L 139   Potassium 3.5 - 5.2 mmol/L 4.1   Chloride 96 - 106 mmol/L 104   CO2 20 - 29 mmol/L 23   Calcium 8.7 - 10.2 mg/dL 9.0   Total Protein 6.0 - 8.5 g/dL 7.2   Total Bilirubin 0.0 - 1.2 mg/dL 0.2   Alkaline Phos 44 - 121 IU/L 82   AST 0 - 40 IU/L 14   ALT 0 - 32 IU/L 10       Latest Ref Rng & Units 06/22/2023    3:11 PM  CBC  WBC 4.0 - 10.5 K/uL 9.7   Hemoglobin 12.0 - 15.0 g/dL 8.7   Hematocrit 81.1 - 46.0 % 30.4   Platelets 150 - 400 K/uL 365     Assessment and plan- Patient is a 27 y.o. female deficiency  Suspect iron deficiency secondary to menorrhagia.  Her recent CBC has not been checked although labs are indicative of iron deficiency.  I  will check CBC and celiac disease panel today.  Given that patient cannot tolerate oral iron we will proceed with IV iron.  Based on her insurance she can get 1 dose of Monoferric.  Repeat CBC ferritin and iron studies in 2 months and I will see her thereafter.  With regards to etiology of her iron deficiency anemia it can be attributed to her menorrhagia I have encouraged her to take to GYN about this   Thank you for this kind referral and the opportunity to participate in the care of this  Patient   Visit Diagnosis 1. Iron deficiency anemia, unspecified iron deficiency anemia type     Dr. Owens Shark, MD, MPH [facility] at [facility] [phone] 06/23/2023

## 2023-06-24 LAB — CELIAC DISEASE PANEL
Endomysial Ab, IgA: NEGATIVE
IgA: 302 mg/dL (ref 87–352)
Tissue Transglutaminase Ab, IgA: <2 U/mL (ref 0–3)

## 2023-06-27 ENCOUNTER — Inpatient Hospital Stay: Payer: 59

## 2023-06-27 ENCOUNTER — Other Ambulatory Visit: Payer: Self-pay | Admitting: Oncology

## 2023-06-27 ENCOUNTER — Encounter: Payer: Self-pay | Admitting: Oncology

## 2023-06-27 VITALS — BP 116/70 | HR 89 | Temp 98.0°F | Resp 18

## 2023-06-27 DIAGNOSIS — D509 Iron deficiency anemia, unspecified: Secondary | ICD-10-CM | POA: Diagnosis not present

## 2023-06-27 DIAGNOSIS — D500 Iron deficiency anemia secondary to blood loss (chronic): Secondary | ICD-10-CM

## 2023-06-27 MED ORDER — SODIUM CHLORIDE 0.9 % IV SOLN
INTRAVENOUS | Status: DC | PRN
  Filled 2023-06-27: qty 250

## 2023-06-27 MED ORDER — SODIUM CHLORIDE 0.9 % IV SOLN
510.0000 mg | INTRAVENOUS | Status: DC
  Administered 2023-06-27: 510 mg via INTRAVENOUS
  Filled 2023-06-27: qty 510

## 2023-07-06 ENCOUNTER — Inpatient Hospital Stay: Payer: 59

## 2023-07-06 VITALS — BP 131/78 | HR 91 | Temp 98.7°F

## 2023-07-06 DIAGNOSIS — D500 Iron deficiency anemia secondary to blood loss (chronic): Secondary | ICD-10-CM

## 2023-07-06 DIAGNOSIS — D509 Iron deficiency anemia, unspecified: Secondary | ICD-10-CM | POA: Diagnosis not present

## 2023-07-06 MED ORDER — SODIUM CHLORIDE 0.9 % IV SOLN
INTRAVENOUS | Status: DC
  Filled 2023-07-06: qty 250

## 2023-07-06 MED ORDER — SODIUM CHLORIDE 0.9 % IV SOLN
510.0000 mg | INTRAVENOUS | Status: DC
  Administered 2023-07-06: 510 mg via INTRAVENOUS
  Filled 2023-07-06: qty 510

## 2023-07-06 NOTE — Patient Instructions (Signed)
 Ferumoxytol Injection What is this medication? FERUMOXYTOL (FER ue MOX i tol) treats low levels of iron in your body (iron deficiency anemia). Iron is a mineral that plays an important role in making red blood cells, which carry oxygen from your lungs to the rest of your body. This medicine may be used for other purposes; ask your health care provider or pharmacist if you have questions. COMMON BRAND NAME(S): Feraheme What should I tell my care team before I take this medication? They need to know if you have any of these conditions: Anemia not caused by low iron levels High levels of iron in the blood Magnetic resonance imaging (MRI) test scheduled An unusual or allergic reaction to iron, other medications, foods, dyes, or preservatives Pregnant or trying to get pregnant Breastfeeding How should I use this medication? This medication is injected into a vein. It is given by your care team in a hospital or clinic setting. Talk to your care team the use of this medication in children. Special care may be needed. Overdosage: If you think you have taken too much of this medicine contact a poison control center or emergency room at once. NOTE: This medicine is only for you. Do not share this medicine with others. What if I miss a dose? It is important not to miss your dose. Call your care team if you are unable to keep an appointment. What may interact with this medication? Other iron products This list may not describe all possible interactions. Give your health care provider a list of all the medicines, herbs, non-prescription drugs, or dietary supplements you use. Also tell them if you smoke, drink alcohol, or use illegal drugs. Some items may interact with your medicine. What should I watch for while using this medication? Visit your care team regularly. Tell your care team if your symptoms do not start to get better or if they get worse. You may need blood work done while you are taking this  medication. You may need to follow a special diet. Talk to your care team. Foods that contain iron include: whole grains/cereals, dried fruits, beans, or peas, leafy green vegetables, and organ meats (liver, kidney). What side effects may I notice from receiving this medication? Side effects that you should report to your care team as soon as possible: Allergic reactions--skin rash, itching, hives, swelling of the face, lips, tongue, or throat Low blood pressure--dizziness, feeling faint or lightheaded, blurry vision Shortness of breath Side effects that usually do not require medical attention (report to your care team if they continue or are bothersome): Flushing Headache Joint pain Muscle pain Nausea Pain, redness, or irritation at injection site This list may not describe all possible side effects. Call your doctor for medical advice about side effects. You may report side effects to FDA at 1-800-FDA-1088. Where should I keep my medication? This medication is given in a hospital or clinic. It will not be stored at home. NOTE: This sheet is a summary. It may not cover all possible information. If you have questions about this medicine, talk to your doctor, pharmacist, or health care provider.  2024 Elsevier/Gold Standard (2023-02-04 00:00:00)

## 2023-07-12 ENCOUNTER — Ambulatory Visit (INDEPENDENT_AMBULATORY_CARE_PROVIDER_SITE_OTHER): Payer: 59 | Admitting: Family

## 2023-07-12 VITALS — BP 115/85 | HR 95 | Wt 275.0 lb

## 2023-07-12 DIAGNOSIS — Z0000 Encounter for general adult medical examination without abnormal findings: Secondary | ICD-10-CM | POA: Diagnosis not present

## 2023-07-12 NOTE — Progress Notes (Signed)
Annual Wellness Visit  Patient: Julie Mcclain, Female    DOB: 1995-12-09, 27 y.o.   MRN: [id number] Visit Date: 08/14/2023  Today's Provider: Miki Kins, FNP  Subjective:    Chief Complaint  Patient presents with   Medicare Wellness    AWW   Julie Mcclain is a 27 y.o. female who presents today for her Annual Wellness Visit.  HPI  Past Medical History:  Diagnosis Date   IDA (iron deficiency anemia)    Menometrorrhagia    Obesity    Ovarian cyst    Snoring    Past Surgical History:  Procedure Laterality Date   denies     Family History  Problem Relation Age of Onset   Diabetes Father        blind in one eye   Social History   Socioeconomic History   Marital status: Single    Spouse name: Not on file   Number of children: 0   Years of education: 32   Highest education level: Not on file  Occupational History   Occupation: unemployed  Tobacco Use   Smoking status: Never    Passive exposure: Never   Smokeless tobacco: Never  Vaping Use   Vaping status: Never Used  Substance and Sexual Activity   Alcohol use: No   Drug use: No   Sexual activity: Yes    Birth control/protection: None  Other Topics Concern   Not on file  Social History Narrative   Not on file   Social Determinants of Health   Financial Resource Strain: Low Risk  (01/06/2022)   Overall Financial Resource Strain (CARDIA)    Difficulty of Paying Living Expenses: Not hard at all  Food Insecurity: No Food Insecurity (06/22/2023)   Hunger Vital Sign    Worried About Running Out of Food in the Last Year: Never true    Ran Out of Food in the Last Year: Never true  Transportation Needs: No Transportation Needs (06/22/2023)   PRAPARE - Administrator, Civil Service (Medical): No    Lack of Transportation (Non-Medical): No  Physical Activity: Insufficiently Active (01/06/2022)   Exercise Vital Sign    Days of Exercise per Week: 1 day    Minutes of Exercise  per Session: 30 min  Stress: No Stress Concern Present (01/06/2022)   [employer] of Occupational Health - Occupational Stress Questionnaire    Feeling of Stress : Only a little  Social Connections: Moderately Isolated (01/06/2022)   Social Connection and Isolation Panel [NHANES]    Frequency of Communication with Friends and Family: More than three times a week    Frequency of Social Gatherings with Friends and Family: More than three times a week    Attends Religious Services: More than 4 times per year    Active Member of [employer] or Organizations: No    Attends Banker Meetings: Never    Marital Status: Never married  Intimate Partner Violence: Not At Risk (06/22/2023)   Humiliation, Afraid, Rape, and Kick questionnaire    Fear of Current or Ex-Partner: No    Emotionally Abused: No    Physically Abused: No    Sexually Abused: No    Medications: Outpatient Medications Prior to Visit  Medication Sig   ARIPiprazole (ABILIFY) 5 MG tablet Take 1 tablet (5 mg total) by mouth at bedtime.   buPROPion (WELLBUTRIN XL) 300 MG 24 hr tablet Take 1 tablet (300 mg total) by mouth daily.  busPIRone (BUSPAR) 10 MG tablet Take 1 tablet (10 mg total) by mouth 2 (two) times daily.   esomeprazole (NEXIUM) 40 MG capsule Take 1 capsule (40 mg total) by mouth at bedtime.   ferrous sulfate (FEROSUL) 325 (65 FE) MG tablet Take 1 tablet (325 mg total) by mouth daily with breakfast.   Vitamin D, Ergocalciferol, (DRISDOL) 1.25 MG (50000 UNIT) CAPS capsule Take 1 capsule (50,000 Units total) by mouth every 7 (seven) days.   [DISCONTINUED] OZEMPIC, 2 MG/DOSE, 8 MG/3ML SOPN INJECT 2 MG UNDER THE SKIN ONE DAY A WEEK   No facility-administered medications prior to visit.    No Known Allergies  Patient Care Team: Miki Kins, FNP as PCP - General (Family Medicine) Earna Coder, MD as Consulting Physician (Hematology) Copland, Ilona Sorrel, PA-C as Consulting Physician (Obstetrics and  Gynecology) Creig Hines, MD as Consulting Physician (Oncology)  Review of Systems  All other systems reviewed and are negative.      Objective:    Vitals: BP 115/85   Pulse 95   Wt 275 lb (124.7 kg)   LMP 06/02/2023   SpO2 97%   BMI 47.20 kg/m    Physical Exam Vitals and nursing note reviewed.  Constitutional:      Appearance: Normal appearance. She is normal weight.  HENT:     Head: Normocephalic.  Eyes:     Extraocular Movements: Extraocular movements intact.     Conjunctiva/sclera: Conjunctivae normal.     Pupils: Pupils are equal, round, and reactive to light.  Cardiovascular:     Rate and Rhythm: Normal rate.  Pulmonary:     Effort: Pulmonary effort is normal.  Neurological:     General: No focal deficit present.     Mental Status: She is alert and oriented to person, place, and time. Mental status is at baseline.  Psychiatric:        Mood and Affect: Mood normal.        Behavior: Behavior normal.        Thought Content: Thought content normal.      Most recent functional status assessment:    08/14/2023    9:14 PM  In your present state of health, do you have any difficulty performing the following activities:  Hearing? 0  Vision? 0  Difficulty concentrating or making decisions? 1  Walking or climbing stairs? 0  Dressing or bathing? 0  Doing errands, shopping? 0  Preparing Food and eating ? N  Using the Toilet? N  In the past six months, have you accidently leaked urine? N  Do you have problems with loss of bowel control? N  Managing your Medications? N  Managing your Finances? N  Housekeeping or managing your Housekeeping? N    Most recent fall risk assessment:     No data to display           Most recent depression screenings:    07/12/2023   10:46 AM 06/22/2023    2:45 PM  PHQ 2/9 Scores  PHQ - 2 Score 1 0  PHQ- 9 Score 4     Most recent cognitive screening:    08/14/2023    9:17 PM  6CIT Screen  What Year? 0 points   What month? 0 points  What time? 0 points  Count back from 20 0 points  Months in reverse 0 points  Repeat phrase 0 points  Total Score 0 points    No results found for any visits on 07/12/23.  Assessment & Plan:      Annual wellness visit done today including the all of the following: Reviewed patient's Family Medical History Reviewed and updated list of patient's medical providers Assessment of cognitive impairment was done Assessed patient's functional ability Established a written schedule for health screening services Health Risk Assessent Completed and Reviewed  Exercise Activities and Dietary recommendations  Goals      CCM:  Monitor and Maintain HbA1c <8%     Coping Skills Enhanced     Evidence-based guidance:  Acknowledge, normalize and validate difficulty of making life-long lifestyle changes.  Identify current effective and ineffective coping strategies.  Encourage patient and caregiver participation in care to increase self-esteem, confidence and feelings of control.  Consider alternative and complementary therapy approaches such as meditation, mindfulness or yoga.  Encourage participation in cognitive behavioral therapy to foster a positive identity, increase self-awareness, as well as bolster self-esteem, confidence and self-efficacy.  Discuss spirituality; be present as concerns are identified; encourage journaling, prayer, worship services, meditation or pastoral counseling.  Encourage participation in pleasurable group activities such as hobbies, singing, sports or volunteering).  Encourage the use of mindfulness; refer for training or intensive intervention.  Consider the use of meditative movement therapy such as tai chi, yoga or qigong.  Promote a regular daily exercise program based on tolerance, ability and patient choice to support positive thinking about disease or aging.   Notes:      Weight (lb) < 250 lb (113.4 kg)        Immunization  History  Administered Date(s) Administered   HPV Quadrivalent 03/22/2007, 04/11/2009   Hepatitis A, Ped/Adol-2 Dose 03/22/2007, 04/11/2009   Hepatitis B, PED/ADOLESCENT 10/04/1994, 12/06/1994, 03/28/1995   MMR 10/27/1995, 04/26/2000   Meningococcal Conjugate 03/22/2007   Tdap 03/22/2007   Varicella 04/26/2000, 03/22/2007    Health Maintenance  Topic Date Due   FOOT EXAM  Never done   OPHTHALMOLOGY EXAM  Never done   DTaP/Tdap/Td (2 - Td or Tdap) 03/21/2017   INFLUENZA VACCINE  Never done   COVID-19 Vaccine (1 - 2023-24 season) Never done   HEMOGLOBIN A1C  12/11/2023   Diabetic kidney evaluation - Urine ACR  01/18/2024   Cervical Cancer Screening (Pap smear)  05/04/2024   Diabetic kidney evaluation - eGFR measurement  06/12/2024   Medicare Annual Wellness (AWV)  07/11/2024   HPV VACCINES  Completed   Hepatitis C Screening  Completed   HIV Screening  Completed     Discussed health benefits of physical activity, and encouraged her to engage in regular exercise appropriate for her age and condition.         Miki Kins, FNP   07/12/2023  This document may have been prepared by [facility] Voice Recognition software and as such may include unintentional dictation errors.

## 2023-07-27 ENCOUNTER — Other Ambulatory Visit: Payer: Self-pay | Admitting: Family

## 2023-08-03 DIAGNOSIS — S00551A Superficial foreign body of lip, initial encounter: Secondary | ICD-10-CM | POA: Diagnosis not present

## 2023-08-14 ENCOUNTER — Encounter: Payer: Self-pay | Admitting: Family

## 2023-08-15 ENCOUNTER — Telehealth: Payer: Self-pay

## 2023-08-15 NOTE — Telephone Encounter (Signed)
Patient had blood transfusion and now she is having a non stop period. She just wants to know if this is okay. Please advise.

## 2023-08-18 ENCOUNTER — Inpatient Hospital Stay: Payer: 59 | Attending: Oncology

## 2023-08-18 DIAGNOSIS — D500 Iron deficiency anemia secondary to blood loss (chronic): Secondary | ICD-10-CM | POA: Insufficient documentation

## 2023-08-18 DIAGNOSIS — N920 Excessive and frequent menstruation with regular cycle: Secondary | ICD-10-CM | POA: Insufficient documentation

## 2023-08-18 DIAGNOSIS — D509 Iron deficiency anemia, unspecified: Secondary | ICD-10-CM

## 2023-08-18 LAB — CBC
HCT: 30 % — ABNORMAL LOW (ref 36.0–46.0)
Hemoglobin: 9.1 g/dL — ABNORMAL LOW (ref 12.0–15.0)
MCH: 26.4 pg (ref 26.0–34.0)
MCHC: 30.3 g/dL (ref 30.0–36.0)
MCV: 87 fL (ref 80.0–100.0)
Platelets: 370 10*3/uL (ref 150–400)
RBC: 3.45 MIL/uL — ABNORMAL LOW (ref 3.87–5.11)
RDW: 24.7 % — ABNORMAL HIGH (ref 11.5–15.5)
WBC: 9.9 10*3/uL (ref 4.0–10.5)
nRBC: 0 % (ref 0.0–0.2)

## 2023-08-18 LAB — FERRITIN: Ferritin: 9 ng/mL — ABNORMAL LOW (ref 11–307)

## 2023-08-18 LAB — IRON AND TIBC
Iron: 19 ug/dL — ABNORMAL LOW (ref 28–170)
Saturation Ratios: 5 % — ABNORMAL LOW (ref 10.4–31.8)
TIBC: 403 ug/dL (ref 250–450)
UIBC: 384 ug/dL

## 2023-08-19 ENCOUNTER — Other Ambulatory Visit: Payer: Medicaid Other

## 2023-08-22 ENCOUNTER — Encounter: Payer: Self-pay | Admitting: Oncology

## 2023-08-22 ENCOUNTER — Inpatient Hospital Stay (HOSPITAL_BASED_OUTPATIENT_CLINIC_OR_DEPARTMENT_OTHER): Payer: 59 | Admitting: Oncology

## 2023-08-22 ENCOUNTER — Inpatient Hospital Stay: Payer: 59

## 2023-08-22 ENCOUNTER — Other Ambulatory Visit: Payer: Medicaid Other

## 2023-08-22 VITALS — BP 117/83 | HR 100 | Temp 97.6°F | Resp 20 | Wt 276.2 lb

## 2023-08-22 VITALS — BP 110/64 | HR 93 | Resp 18

## 2023-08-22 DIAGNOSIS — D500 Iron deficiency anemia secondary to blood loss (chronic): Secondary | ICD-10-CM

## 2023-08-22 MED ORDER — SODIUM CHLORIDE 0.9 % IV SOLN
INTRAVENOUS | Status: DC
  Filled 2023-08-22: qty 250

## 2023-08-22 MED ORDER — SODIUM CHLORIDE 0.9 % IV SOLN
510.0000 mg | INTRAVENOUS | Status: DC
Start: 2023-08-22 — End: 2023-08-22
  Administered 2023-08-22: 510 mg via INTRAVENOUS
  Filled 2023-08-22: qty 510

## 2023-08-22 NOTE — Progress Notes (Unsigned)
Hematology/Oncology Consult note [facility]  [phone] [phone]  Patient Care Team: Miki Kins, FNP as PCP - General (Family Medicine) Earna Coder, MD as Consulting Physician (Hematology) Copland, Chucky May as Consulting Physician (Obstetrics and Gynecology) Creig Hines, MD as Consulting Physician (Oncology)   Name of the patient: Julie Mcclain  [id number]  06-11-1996   Date of visit: 08/22/23  Diagnosis-iron deficiency anemia secondary to menorrhagia  Chief complaint/ Reason for visit-routine follow-up of iron deficiency anemia  Heme/Onc history: patient is a 27 year old female referred For iron deficiency.  Her labs from 06/13/2023 showed ferritin level of 3 with an iron saturation of 3% and elevated TIBC.  B12 levels were normal at 480.  CBC was not checked recently and TSH was normal at 2.1.  Patient reports ongoing fatigue as well as craving ice.  She has not been able to tolerate oral iron.  Denies any consistent use of NSAIDs.  Denies any dark melanotic stools or bright red blood in stools.  No family history of colon cancer.  She does report heavy menstrual cycles which can sometimes last for a week.  She has not seen GYN for her menorrhagia.   Interval history-menstrual cycles this time around lasted for 2 weeks.  She has still not seen GYN.  Reports ongoing fatigue.  ECOG PS- 0 Pain scale- 0  Review of systems- Review of Systems  Constitutional:  Positive for malaise/fatigue.      No Known Allergies   Past Medical History:  Diagnosis Date   IDA (iron deficiency anemia)    Menometrorrhagia    Obesity    Ovarian cyst    Snoring    Supervision of high risk pregnancy, antepartum 01/06/2022     Past Surgical History:  Procedure Laterality Date   denies      Social History   Socioeconomic History   Marital status: Single    Spouse name: Not on file   Number of children: 0   Years  of education: 12   Highest education level: Not on file  Occupational History   Occupation: unemployed  Tobacco Use   Smoking status: Never    Passive exposure: Never   Smokeless tobacco: Never  Vaping Use   Vaping status: Never Used  Substance and Sexual Activity   Alcohol use: No   Drug use: No   Sexual activity: Yes    Birth control/protection: None  Other Topics Concern   Not on file  Social History Narrative   Not on file   Social Determinants of Health   Financial Resource Strain: Low Risk  (01/06/2022)   Overall Financial Resource Strain (CARDIA)    Difficulty of Paying Living Expenses: Not hard at all  Food Insecurity: No Food Insecurity (06/22/2023)   Hunger Vital Sign    Worried About Running Out of Food in the Last Year: Never true    Ran Out of Food in the Last Year: Never true  Transportation Needs: No Transportation Needs (06/22/2023)   PRAPARE - Administrator, Civil Service (Medical): No    Lack of Transportation (Non-Medical): No  Physical Activity: Insufficiently Active (01/06/2022)   Exercise Vital Sign    Days of Exercise per Week: 1 day    Minutes of Exercise per Session: 30 min  Stress: No Stress Concern Present (01/06/2022)   [employer] of Occupational Health - Occupational Stress Questionnaire    Feeling of Stress : Only a  little  Social Connections: Moderately Isolated (01/06/2022)   Social Connection and Isolation Panel [NHANES]    Frequency of Communication with Friends and Family: More than three times a week    Frequency of Social Gatherings with Friends and Family: More than three times a week    Attends Religious Services: More than 4 times per year    Active Member of [employer] or Organizations: No    Attends Banker Meetings: Never    Marital Status: Never married  Intimate Partner Violence: Not At Risk (06/22/2023)   Humiliation, Afraid, Rape, and Kick questionnaire    Fear of Current or Ex-Partner: No     Emotionally Abused: No    Physically Abused: No    Sexually Abused: No    Family History  Problem Relation Age of Onset   Diabetes Father        blind in one eye     Current Outpatient Medications:    ARIPiprazole (ABILIFY) 5 MG tablet, Take 1 tablet (5 mg total) by mouth at bedtime., Disp: 90 tablet, Rfl: 1   buPROPion (WELLBUTRIN XL) 300 MG 24 hr tablet, Take 1 tablet (300 mg total) by mouth daily., Disp: 90 tablet, Rfl: 1   busPIRone (BUSPAR) 10 MG tablet, Take 1 tablet (10 mg total) by mouth 2 (two) times daily., Disp: 180 tablet, Rfl: 1   esomeprazole (NEXIUM) 40 MG capsule, Take 1 capsule (40 mg total) by mouth at bedtime., Disp: 90 capsule, Rfl: 1   ferrous sulfate (FEROSUL) 325 (65 FE) MG tablet, Take 1 tablet (325 mg total) by mouth daily with breakfast., Disp: 90 tablet, Rfl: 1   OZEMPIC, 2 MG/DOSE, 8 MG/3ML SOPN, INJECT 2 MG UNDER THE SKIN ONE DAY A WEEK, Disp: 3 mL, Rfl: 0   Vitamin D, Ergocalciferol, (DRISDOL) 1.25 MG (50000 UNIT) CAPS capsule, Take 1 capsule (50,000 Units total) by mouth every 7 (seven) days., Disp: 12 capsule, Rfl: 3 No current facility-administered medications for this visit.  Facility-Administered Medications Ordered in Other Visits:    0.9 %  sodium chloride infusion, , Intravenous, Continuous, Creig Hines, MD   ferumoxytol [facility]) 510 mg in sodium chloride 0.9 % 100 mL IVPB, 510 mg, Intravenous, Weekly, Creig Hines, MD  Physical exam:  Vitals:   08/22/23 1448  BP: 117/83  Pulse: 100  Resp: 20  Temp: 97.6 F (36.4 C)  SpO2: 100%  Weight: 276 lb 3.2 oz (125.3 kg)   Physical Exam Cardiovascular:     Rate and Rhythm: Normal rate and regular rhythm.     Heart sounds: Normal heart sounds.  Pulmonary:     Effort: Pulmonary effort is normal.  Skin:    General: Skin is warm and dry.  Neurological:     Mental Status: She is alert and oriented to person, place, and time.         Latest Ref Rng & Units 06/13/2023    3:52 PM  CMP   Glucose 70 - 99 mg/dL 79   BUN 6 - 20 mg/dL 11   Creatinine 9.52 - 1.00 mg/dL 8.41   Sodium 324 - 401 mmol/L 139   Potassium 3.5 - 5.2 mmol/L 4.1   Chloride 96 - 106 mmol/L 104   CO2 20 - 29 mmol/L 23   Calcium 8.7 - 10.2 mg/dL 9.0   Total Protein 6.0 - 8.5 g/dL 7.2   Total Bilirubin 0.0 - 1.2 mg/dL 0.2   Alkaline Phos 44 - 121 IU/L 82  AST 0 - 40 IU/L 14   ALT 0 - 32 IU/L 10       Latest Ref Rng & Units 08/18/2023   12:58 PM  CBC  WBC 4.0 - 10.5 K/uL 9.9   Hemoglobin 12.0 - 15.0 g/dL 9.1   Hematocrit 24.4 - 46.0 % 30.0   Platelets 150 - 400 K/uL 370     Assessment and plan- Patient is a 27 y.o. female here for routine follow-up of iron deficiency anemia secondary to menorrhagia  Patient's hemoglobin continues to be low at 9.1 today although improved as compared to values last year.  Iron studies continue to show iron deficiency and she would benefit from IV iron.  Will plan to give her 2 doses of Feraheme at this time.  Discussed risks and benefits of Feraheme including all but not limited to possible risk of infusion reactions.  Patient understands and agrees to proceed as planned.  CBC ferritin and iron studies in 2 4 and 6 months and I will see her back in 6 months.  I have strongly recommended that she should follow-up with GYN and I will make a referral for the same as well.  Giving her IV iron unfortunately is not going to fix the etiology of her iron deficiency anemia until she sees GYN or her primary care provider   Visit Diagnosis 1. Iron deficiency anemia due to chronic blood loss      Dr. Owens Shark, MD, MPH [facility] [phone] 08/22/2023 3:18 PM

## 2023-08-22 NOTE — Patient Instructions (Signed)
 Ferumoxytol Injection What is this medication? FERUMOXYTOL (FER ue MOX i tol) treats low levels of iron in your body (iron deficiency anemia). Iron is a mineral that plays an important role in making red blood cells, which carry oxygen from your lungs to the rest of your body. This medicine may be used for other purposes; ask your health care provider or pharmacist if you have questions. COMMON BRAND NAME(S): Feraheme What should I tell my care team before I take this medication? They need to know if you have any of these conditions: Anemia not caused by low iron levels High levels of iron in the blood Magnetic resonance imaging (MRI) test scheduled An unusual or allergic reaction to iron, other medications, foods, dyes, or preservatives Pregnant or trying to get pregnant Breastfeeding How should I use this medication? This medication is injected into a vein. It is given by your care team in a hospital or clinic setting. Talk to your care team the use of this medication in children. Special care may be needed. Overdosage: If you think you have taken too much of this medicine contact a poison control center or emergency room at once. NOTE: This medicine is only for you. Do not share this medicine with others. What if I miss a dose? It is important not to miss your dose. Call your care team if you are unable to keep an appointment. What may interact with this medication? Other iron products This list may not describe all possible interactions. Give your health care provider a list of all the medicines, herbs, non-prescription drugs, or dietary supplements you use. Also tell them if you smoke, drink alcohol, or use illegal drugs. Some items may interact with your medicine. What should I watch for while using this medication? Visit your care team regularly. Tell your care team if your symptoms do not start to get better or if they get worse. You may need blood work done while you are taking this  medication. You may need to follow a special diet. Talk to your care team. Foods that contain iron include: whole grains/cereals, dried fruits, beans, or peas, leafy green vegetables, and organ meats (liver, kidney). What side effects may I notice from receiving this medication? Side effects that you should report to your care team as soon as possible: Allergic reactions--skin rash, itching, hives, swelling of the face, lips, tongue, or throat Low blood pressure--dizziness, feeling faint or lightheaded, blurry vision Shortness of breath Side effects that usually do not require medical attention (report to your care team if they continue or are bothersome): Flushing Headache Joint pain Muscle pain Nausea Pain, redness, or irritation at injection site This list may not describe all possible side effects. Call your doctor for medical advice about side effects. You may report side effects to FDA at 1-800-FDA-1088. Where should I keep my medication? This medication is given in a hospital or clinic. It will not be stored at home. NOTE: This sheet is a summary. It may not cover all possible information. If you have questions about this medicine, talk to your doctor, pharmacist, or health care provider.  2024 Elsevier/Gold Standard (2023-02-04 00:00:00)

## 2023-08-23 ENCOUNTER — Encounter: Payer: Self-pay | Admitting: Oncology

## 2023-08-25 NOTE — Progress Notes (Signed)
GYNECOLOGY ANNUAL PHYSICAL EXAM PROGRESS NOTE  Subjective:    Julie Mcclain is a 27 y.o. G57P0010 female who presents for an annual exam.  The patient is sexually active. The patient participates in regular exercise: yes. Has the patient ever been transfused or tattooed?: yes. The patient reports that there is not domestic violence in her life.  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 She expresses an interest in a future pregnancy, but partners with women. She  has a female friend who is willing to be a sperm donor. She also reports that her periods are heavier. She does not contracept. Is presently under PCP care for weight loss, using injectables.  She shares a history of GC,, Trich and BV and HSV1.  The patient has the following complaints today: She wants to plan for future pregnancy  Menstrual History: Menarche age: 80 Patient's last menstrual period was 08/15/2023. Period Cycle (Days): 28 Period Duration (Days): 5-14 Period Pattern: Regular Menstrual Flow: Heavy Menstrual Control: Maxi pad Menstrual Control Change Freq (Hours): 1 Dysmenorrhea: None   Gynecologic History:  Contraception: none History of STI's:  Last Pap: 05/05/2023. Results were: normal.          OB History  Gravida Para Term Preterm AB Living  2 0 0 0 1 0  SAB IAB Ectopic Multiple Live Births  1 0 0 0 0    # Outcome Date GA Lbr Len/2nd Weight Sex Type Anes PTL Lv  2 Gravida           1 SAB 09/2020     SAB       Past Medical History:  Diagnosis Date   IDA (iron deficiency anemia)    Menometrorrhagia    Obesity    Ovarian cyst    Snoring    Supervision of high risk pregnancy, antepartum 01/06/2022    Past Surgical History:  Procedure Laterality Date   denies      Family History  Problem Relation Age of Onset   Diabetes Father        blind in one eye    Social History   Socioeconomic History   Marital status: Single    Spouse name: Not on file   Number of children:  0   Years of education: 12   Highest education level: Not on file  Occupational History   Occupation: unemployed  Tobacco Use   Smoking status: Never    Passive exposure: Never   Smokeless tobacco: Never  Vaping Use   Vaping status: Never Used  Substance and Sexual Activity   Alcohol use: No   Drug use: No   Sexual activity: Yes    Birth control/protection: None  Other Topics Concern   Not on file  Social History Narrative   Not on file   Social Drivers of Health   Financial Resource Strain: Low Risk  (01/06/2022)   Overall Financial Resource Strain (CARDIA)    Difficulty of Paying Living Expenses: Not hard at all  Food Insecurity: No Food Insecurity (06/22/2023)   Hunger Vital Sign    Worried About Running Out of Food in the Last Year: Never true    Ran Out of Food in the Last Year: Never true  Transportation Needs: No Transportation Needs (06/22/2023)   PRAPARE - Administrator, Civil Service (Medical): No    Lack of Transportation (Non-Medical): No  Physical Activity: Insufficiently Active (01/06/2022)   Exercise Vital Sign    Days of  Exercise per Week: 1 day    Minutes of Exercise per Session: 30 min  Stress: No Stress Concern Present (01/06/2022)   [employer] of Occupational Health - Occupational Stress Questionnaire    Feeling of Stress : Only a little  Social Connections: Moderately Isolated (01/06/2022)   Social Connection and Isolation Panel [NHANES]    Frequency of Communication with Friends and Family: More than three times a week    Frequency of Social Gatherings with Friends and Family: More than three times a week    Attends Religious Services: More than 4 times per year    Active Member of [employer] or Organizations: No    Attends Banker Meetings: Never    Marital Status: Never married  Intimate Partner Violence: Not At Risk (06/22/2023)   Humiliation, Afraid, Rape, and Kick questionnaire    Fear of Current or Ex-Partner: No     Emotionally Abused: No    Physically Abused: No    Sexually Abused: No    Current Outpatient Medications on File Prior to Visit  Medication Sig Dispense Refill   ARIPiprazole (ABILIFY) 5 MG tablet Take 1 tablet (5 mg total) by mouth at bedtime. 90 tablet 1   buPROPion (WELLBUTRIN XL) 300 MG 24 hr tablet Take 1 tablet (300 mg total) by mouth daily. 90 tablet 1   busPIRone (BUSPAR) 10 MG tablet Take 1 tablet (10 mg total) by mouth 2 (two) times daily. 180 tablet 1   esomeprazole (NEXIUM) 40 MG capsule Take 1 capsule (40 mg total) by mouth at bedtime. 90 capsule 1   ferrous sulfate (FEROSUL) 325 (65 FE) MG tablet Take 1 tablet (325 mg total) by mouth daily with breakfast. 90 tablet 1   OZEMPIC, 2 MG/DOSE, 8 MG/3ML SOPN INJECT 2 MG UNDER THE SKIN ONE DAY A WEEK 3 mL 0   Vitamin D, Ergocalciferol, (DRISDOL) 1.25 MG (50000 UNIT) CAPS capsule Take 1 capsule (50,000 Units total) by mouth every 7 (seven) days. 12 capsule 3   No current facility-administered medications on file prior to visit.    No Known Allergies   Review of Systems Constitutional: negative for chills, fatigue, fevers and sweats Eyes: negative for irritation, redness and visual disturbance Ears, nose, mouth, throat, and face: negative for hearing loss, nasal congestion, snoring and tinnitus Respiratory: negative for asthma, cough, sputum Cardiovascular: negative for chest pain, dyspnea, exertional chest pressure/discomfort, irregular heart beat, palpitations and syncope Gastrointestinal: negative for abdominal pain, change in bowel habits, nausea and vomiting Genitourinary: negative for abnormal menstrual periods, genital lesions, sexual problems and vaginal discharge, dysuria and urinary incontinence Integument/breast: negative for breast lump, breast tenderness and nipple discharge Hematologic/lymphatic: negative for bleeding and easy bruising Musculoskeletal:negative for back pain and muscle weakness Neurological: negative  for dizziness, headaches, vertigo and weakness Endocrine: negative for diabetic symptoms including polydipsia, polyuria and skin dryness Allergic/Immunologic: negative for hay fever and urticaria      Objective:  There were no vitals taken for this visit. There is no height or weight on file to calculate BMI.    General Appearance:    Alert, cooperative, no distress, appears stated age  Head:    Normocephalic, without obvious abnormality, atraumatic  Eyes:    PERRL, conjunctiva/corneas clear, EOM's intact, both eyes  Ears:    Normal external ear canals, both ears  Nose:   Nares normal, septum midline, mucosa normal, no drainage or sinus tenderness  Throat:   Lips, mucosa, and tongue normal; teeth and gums normal  Neck:   Supple, symmetrical, trachea midline, no adenopathy; thyroid: no enlargement/tenderness/nodules; no carotid bruit or JVD  Back:     Symmetric, no curvature, ROM normal, no CVA tenderness  Lungs:     Clear to auscultation bilaterally, respirations unlabored  Chest Wall:    No tenderness or deformity   Heart:    Regular rate and rhythm, S1 and S2 normal, no murmur, rub or gallop  Breast Exam:    No tenderness, masses, or nipple abnormality, pendulous, nipples slightly flat.  Abdomen:     Soft, non-tender, bowel sounds active all four quadrants, no masses, no organomegaly.  Adipose noted  Genitalia:    Pelvic:external genitalia normal, vagina without lesions, discharge, or tenderness, rectovaginal septum  normal. Cervix normal in appearance, no cervical motion tenderness, no adnexal masses or tenderness.  Uterus normal size, shape, mobile, regular contours, nontender.Copious clear , nonmalodorous vaginal dischargwe noted. Aptima swab retrieved.  Rectal:    Normal external sphincter.  No hemorrhoids appreciated. Internal exam not done.   Extremities:   Extremities normal, atraumatic, no cyanosis or edema  Pulses:   2+ and symmetric all extremities  Skin:   Skin color, texture,  turgor normal, no rashes or lesions  Lymph nodes:   Cervical, supraclavicular, and axillary nodes normal  Neurologic:   CNII-XII intact, normal strength, sensation and reflexes throughout   .  Labs:  Lab Results  Component Value Date   WBC 9.9 08/18/2023   HGB 9.1 (L) 08/18/2023   HCT 30.0 (L) 08/18/2023   MCV 87.0 08/18/2023   PLT 370 08/18/2023    Lab Results  Component Value Date   CREATININE 0.89 06/13/2023   BUN 11 06/13/2023   NA 139 06/13/2023   K 4.1 06/13/2023   CL 104 06/13/2023   CO2 23 06/13/2023    Lab Results  Component Value Date   ALT 10 06/13/2023   AST 14 06/13/2023   ALKPHOS 82 06/13/2023   BILITOT 0.2 06/13/2023    Lab Results  Component Value Date   TSH 2.110 06/13/2023     Assessment:   No diagnosis found.   Plan:  Blood tests: CBC with diff, Comprehensive metabolic panel, and HIV, RPR, Hep B today. Breast self exam technique reviewed and patient encouraged to perform self-exam monthly. Contraception: OCP (estrogen/progesterone).We discussed a trial of OCPs to address her heavier bleeding pattern, while she is working on weight loss. Discussed healthy lifestyle modifications. Mammogram  not due PSTI screening, including blood work. Will RX OCPs for her.  Follow up in 3 months with MD to discuss her pregnancy desires, once she has lost more weight    Paula Compton, CNM [facility]

## 2023-08-25 NOTE — Patient Instructions (Signed)
Preventive Care 21-29 Years Old, Female Preventive care refers to lifestyle choices and visits with your health care provider that can promote health and wellness. Preventive care visits are also called wellness exams. What can I expect for my preventive care visit? Counseling During your preventive care visit, your health care provider may ask about your: Medical history, including: Past medical problems. Family medical history. Pregnancy history. Current health, including: Menstrual cycle. Method of birth control. Emotional well-being. Home life and relationship well-being. Sexual activity and sexual health. Lifestyle, including: Alcohol, nicotine or tobacco, and drug use. Access to firearms. Diet, exercise, and sleep habits. Work and work environment. Sunscreen use. Safety issues such as seatbelt and bike helmet use. Physical exam Your health care provider may check your: Height and weight. These may be used to calculate your BMI (body mass index). BMI is a measurement that tells if you are at a healthy weight. Waist circumference. This measures the distance around your waistline. This measurement also tells if you are at a healthy weight and may help predict your risk of certain diseases, such as type 2 diabetes and high blood pressure. Heart rate and blood pressure. Body temperature. Skin for abnormal spots. What immunizations do I need?  Vaccines are usually given at various ages, according to a schedule. Your health care provider will recommend vaccines for you based on your age, medical history, and lifestyle or other factors, such as travel or where you work. What tests do I need? Screening Your health care provider may recommend screening tests for certain conditions. This may include: Pelvic exam and Pap test. Lipid and cholesterol levels. Diabetes screening. This is done by checking your blood sugar (glucose) after you have not eaten for a while (fasting). Hepatitis  B test. Hepatitis C test. HIV (human immunodeficiency virus) test. STI (sexually transmitted infection) testing, if you are at risk. BRCA-related cancer screening. This may be done if you have a family history of breast, ovarian, tubal, or peritoneal cancers. Talk with your health care provider about your test results, treatment options, and if necessary, the need for more tests. Follow these instructions at home: Eating and drinking  Eat a healthy diet that includes fresh fruits and vegetables, whole grains, lean protein, and low-fat dairy products. Take vitamin and mineral supplements as recommended by your health care provider. Do not drink alcohol if: Your health care provider tells you not to drink. You are pregnant, may be pregnant, or are planning to become pregnant. If you drink alcohol: Limit how much you have to 0-1 drink a day. Know how much alcohol is in your drink. In the [place] one drink equals one 12 oz bottle of beer (355 mL), one 5 oz glass of wine (148 mL), or one 1 oz glass of hard liquor (44 mL). Lifestyle Brush your teeth every morning and night with fluoride toothpaste. Floss one time each day. Exercise for at least 30 minutes 5 or more days each week. Do not use any products that contain nicotine or tobacco. These products include cigarettes, chewing tobacco, and vaping devices, such as e-cigarettes. If you need help quitting, ask your health care provider. Do not use drugs. If you are sexually active, practice safe sex. Use a condom or other form of protection to prevent STIs. If you do not wish to become pregnant, use a form of birth control. If you plan to become pregnant, see your health care provider for a prepregnancy visit. Find healthy ways to manage stress, such as: Meditation,   yoga, or listening to music. Journaling. Talking to a trusted person. Spending time with friends and family. Minimize exposure to UV radiation to reduce your risk of skin  cancer. Safety Always wear your seat belt while driving or riding in a vehicle. Do not drive: If you have been drinking alcohol. Do not ride with someone who has been drinking. If you have been using any mind-altering substances or drugs. While texting. When you are tired or distracted. Wear a helmet and other protective equipment during sports activities. If you have firearms in your house, make sure you follow all gun safety procedures. Seek help if you have been physically or sexually abused. What's next? Go to your health care provider once a year for an annual wellness visit. Ask your health care provider how often you should have your eyes and teeth checked. Stay up to date on all vaccines. This information is not intended to replace advice given to you by your health care provider. Make sure you discuss any questions you have with your health care provider. Document Revised: 02/25/2021 Document Reviewed: 02/25/2021 Elsevier Patient Education  2024 Elsevier Inc. Breast Self-Awareness Breast self-awareness is knowing how your breasts look and feel. You need to: Check your breasts on a regular basis. Tell your doctor about any changes. Become familiar with the look and feel of your breasts. This can help you catch a breast problem while it is still small and can be treated. You should do breast self-exams even if you have breast implants. What you need: A mirror. A well-lit room. A pillow or other soft object. How to do a breast self-exam Follow these steps to do a breast self-exam: Look for changes  Take off all the clothes above your waist. Stand in front of a mirror in a room with good lighting. Put your hands down at your sides. Compare your breasts in the mirror. Look for any difference between them, such as: A difference in shape. A difference in size. Wrinkles, dips, and bumps in one breast and not the other. Look at each breast for changes in the skin, such  as: Redness. Scaly areas. Skin that has gotten thicker. Dimpling. Open sores (ulcers). Look for changes in your nipples, such as: Fluid coming out of a nipple. Fluid around a nipple. Bleeding. Dimpling. Redness. A nipple that looks pushed in (retracted), or that has changed position. Feel for changes Lie on your back. Feel each breast. To do this: Pick a breast to feel. Place a pillow under the shoulder closest to that breast. Put the arm closest to that breast behind your head. Feel the nipple area of that breast using the hand of your other arm. Feel the area with the pads of your three middle fingers by making small circles with your fingers. Use light, medium, and firm pressure. Continue the overlapping circles, moving downward over the breast. Keep making circles with your fingers. Stop when you feel your ribs. Start making circles with your fingers again, this time going upward until you reach your collarbone. Then, make circles outward across your breast and into your armpit area. Squeeze your nipple. Check for discharge and lumps. Repeat these steps to check your other breast. Sit or stand in the tub or shower. With soapy water on your skin, feel each breast the same way you did when you were lying down. Write down what you find Writing down what you find can help you remember what to tell your doctor. Write down: What is   normal for each breast. Any changes you find in each breast. These include: The kind of changes you find. A tender or painful breast. Any lump you find. Write down its size and where it is. When you last had your monthly period (menstrual cycle). General tips If you are breastfeeding, the best time to check your breasts is after you feed your baby or after you use a breast pump. If you get monthly bleeding, the best time to check your breasts is 5-7 days after your monthly cycle ends. With time, you will become comfortable with the self-exam. You will  also start to know if there are changes in your breasts. Contact a doctor if: You see a change in the shape or size of your breasts or nipples. You see a change in the skin of your breast or nipples, such as red or scaly skin. You have fluid coming from your nipples that is not normal. You find a new lump or thick area. You have breast pain. You have any concerns about your breast health. Summary Breast self-awareness includes looking for changes in your breasts and feeling for changes within your breasts. You should do breast self-awareness in front of a mirror in a well-lit room. If you get monthly periods (menstrual cycles), the best time to check your breasts is 5-7 days after your period ends. Tell your doctor about any changes you see in your breasts. Changes include changes in size, changes on the skin, painful or tender breasts, or fluid from your nipples that is not normal. This information is not intended to replace advice given to you by your health care provider. Make sure you discuss any questions you have with your health care provider. Document Revised: 02/04/2022 Document Reviewed: 07/02/2021 Elsevier Patient Education  2024 Elsevier Inc.  

## 2023-08-29 ENCOUNTER — Other Ambulatory Visit (HOSPITAL_COMMUNITY)
Admission: RE | Admit: 2023-08-29 | Discharge: 2023-08-29 | Disposition: A | Discharge status: Home / Self Care | Payer: 59 | Source: Ambulatory Visit | Attending: Obstetrics | Admitting: Obstetrics

## 2023-08-29 ENCOUNTER — Ambulatory Visit (INDEPENDENT_AMBULATORY_CARE_PROVIDER_SITE_OTHER): Payer: 59 | Admitting: Obstetrics

## 2023-08-29 ENCOUNTER — Inpatient Hospital Stay: Payer: 59

## 2023-08-29 VITALS — BP 109/70 | HR 87 | Temp 96.2°F | Resp 20

## 2023-08-29 VITALS — Resp 16 | Ht 64.0 in | Wt 274.2 lb

## 2023-08-29 DIAGNOSIS — Z8619 Personal history of other infectious and parasitic diseases: Secondary | ICD-10-CM | POA: Insufficient documentation

## 2023-08-29 DIAGNOSIS — Z124 Encounter for screening for malignant neoplasm of cervix: Secondary | ICD-10-CM

## 2023-08-29 DIAGNOSIS — D500 Iron deficiency anemia secondary to blood loss (chronic): Secondary | ICD-10-CM | POA: Diagnosis not present

## 2023-08-29 DIAGNOSIS — Z113 Encounter for screening for infections with a predominantly sexual mode of transmission: Secondary | ICD-10-CM

## 2023-08-29 DIAGNOSIS — Z01419 Encounter for gynecological examination (general) (routine) without abnormal findings: Secondary | ICD-10-CM | POA: Diagnosis not present

## 2023-08-29 DIAGNOSIS — B009 Herpesviral infection, unspecified: Secondary | ICD-10-CM | POA: Insufficient documentation

## 2023-08-29 DIAGNOSIS — Z30011 Encounter for initial prescription of contraceptive pills: Secondary | ICD-10-CM

## 2023-08-29 HISTORY — DX: Personal history of other infectious and parasitic diseases: Z86.19

## 2023-08-29 MED ORDER — SODIUM CHLORIDE 0.9 % IV SOLN
510.0000 mg | INTRAVENOUS | Status: DC
  Administered 2023-08-29: 510 mg via INTRAVENOUS
  Filled 2023-08-29: qty 510

## 2023-08-29 MED ORDER — NORETHIN ACE-ETH ESTRAD-FE 1-20 MG-MCG PO TABS: 1.0000 | ORAL_TABLET | Freq: Every day | ORAL | 4 refills | Status: DC

## 2023-08-29 MED ORDER — SODIUM CHLORIDE 0.9% FLUSH
10.0000 mL | Freq: Once | INTRAVENOUS | Status: DC | PRN
  Filled 2023-08-29: qty 10

## 2023-08-31 ENCOUNTER — Telehealth: Payer: Self-pay

## 2023-08-31 ENCOUNTER — Other Ambulatory Visit: Payer: Self-pay | Admitting: Obstetrics

## 2023-08-31 ENCOUNTER — Encounter: Payer: Self-pay | Admitting: Obstetrics

## 2023-08-31 DIAGNOSIS — N760 Acute vaginitis: Secondary | ICD-10-CM

## 2023-08-31 LAB — RPR, QUANT+TP ABS (REFLEX)
Rapid Plasma Reagin, Quant: 1:4 {titer} — ABNORMAL HIGH
T Pallidum Abs: REACTIVE — AB

## 2023-08-31 LAB — COMPREHENSIVE METABOLIC PANEL
ALT: 15 [IU]/L (ref 0–32)
AST: 15 [IU]/L (ref 0–40)
Albumin: 4.4 g/dL (ref 4.0–5.0)
Alkaline Phosphatase: 80 [IU]/L (ref 44–121)
BUN/Creatinine Ratio: 14 (ref 9–23)
BUN: 12 mg/dL (ref 6–20)
Bilirubin Total: 0.3 mg/dL (ref 0.0–1.2)
CO2: 24 mmol/L (ref 20–29)
Calcium: 9.1 mg/dL (ref 8.7–10.2)
Chloride: 104 mmol/L (ref 96–106)
Creatinine, Ser: 0.87 mg/dL (ref 0.57–1.00)
Globulin, Total: 2.7 g/dL (ref 1.5–4.5)
Glucose: 69 mg/dL — ABNORMAL LOW (ref 70–99)
Potassium: 4.3 mmol/L (ref 3.5–5.2)
Sodium: 143 mmol/L (ref 134–144)
Total Protein: 7.1 g/dL (ref 6.0–8.5)
eGFR: 92 mL/min/{1.73_m2} (ref >59)

## 2023-08-31 LAB — CBC
Hematocrit: 37.1 % (ref 34.0–46.6)
Hemoglobin: 11 g/dL — ABNORMAL LOW (ref 11.1–15.9)
MCH: 26.7 pg (ref 26.6–33.0)
MCHC: 29.6 g/dL — ABNORMAL LOW (ref 31.5–35.7)
MCV: 90 fL (ref 79–97)
Platelets: 414 10*3/uL (ref 150–450)
RBC: 4.12 x10E6/uL (ref 3.77–5.28)
RDW: 24 % — ABNORMAL HIGH (ref 11.7–15.4)
WBC: 12.7 10*3/uL — ABNORMAL HIGH (ref 3.4–10.8)

## 2023-08-31 LAB — CERVICOVAGINAL ANCILLARY ONLY
Bacterial Vaginitis (gardnerella): POSITIVE — AB
Candida Glabrata: NEGATIVE
Candida Vaginitis: NEGATIVE
Chlamydia: NEGATIVE
Comment: NEGATIVE
Comment: NEGATIVE
Comment: NEGATIVE
Comment: NEGATIVE
Comment: NEGATIVE
Comment: NORMAL
Neisseria Gonorrhea: NEGATIVE
Trichomonas: NEGATIVE

## 2023-08-31 LAB — RPR: RPR Ser Ql: REACTIVE — AB

## 2023-08-31 LAB — HIV ANTIBODY (ROUTINE TESTING W REFLEX): HIV Screen 4th Generation wRfx: NONREACTIVE

## 2023-08-31 LAB — HEPATITIS B SURFACE ANTIGEN: Hepatitis B Surface Ag: NEGATIVE

## 2023-08-31 LAB — HEPATITIS C ANTIBODY: Hep C Virus Ab: NONREACTIVE

## 2023-08-31 MED ORDER — METRONIDAZOLE 0.75 % VA GEL: 1.0000 | Freq: Every day | VAGINAL | 0 refills | Status: DC

## 2023-08-31 MED ORDER — METRONIDAZOLE 0.75 % VA GEL: 1.0000 | Freq: Every day | VAGINAL | 0 refills | Status: AC

## 2023-08-31 NOTE — Telephone Encounter (Signed)
Patient inquiring about swab results positive for bacterial vaginitis. Advised will send rx. Patient prefers gel. Aware to abstain from acohol and intercourse during treatment. She is also inquiring about RPR and other labs. Advised will send to Claris Che to reach out to her regarding other results and any additional care needed.

## 2023-09-01 LAB — CYTOLOGY - PAP
Diagnosis: NEGATIVE
Diagnosis: REACTIVE

## 2023-09-22 ENCOUNTER — Other Ambulatory Visit: Payer: Self-pay

## 2023-09-22 ENCOUNTER — Emergency Department
Admission: EM | Admit: 2023-09-22 | Discharge: 2023-09-22 | Disposition: A | Discharge status: Home / Self Care | Payer: 59 | Attending: Emergency Medicine | Admitting: Emergency Medicine

## 2023-09-22 ENCOUNTER — Emergency Department: Payer: 59

## 2023-09-22 DIAGNOSIS — R1030 Lower abdominal pain, unspecified: Secondary | ICD-10-CM | POA: Diagnosis not present

## 2023-09-22 DIAGNOSIS — N939 Abnormal uterine and vaginal bleeding, unspecified: Secondary | ICD-10-CM | POA: Insufficient documentation

## 2023-09-22 LAB — COMPREHENSIVE METABOLIC PANEL
ALT: 20 U/L (ref 0–44)
AST: 20 U/L (ref 15–41)
Albumin: 4.1 g/dL (ref 3.5–5.0)
Alkaline Phosphatase: 46 U/L (ref 38–126)
Anion gap: 9 (ref 5–15)
BUN: 14 mg/dL (ref 6–20)
CO2: 26 mmol/L (ref 22–32)
Calcium: 8.9 mg/dL (ref 8.9–10.3)
Chloride: 107 mmol/L (ref 98–111)
Creatinine, Ser: 0.88 mg/dL (ref 0.44–1.00)
GFR, Estimated: >60 mL/min (ref >60)
Glucose, Bld: 92 mg/dL (ref 70–99)
Potassium: 3.8 mmol/L (ref 3.5–5.1)
Sodium: 142 mmol/L (ref 135–145)
Total Bilirubin: 0.4 mg/dL (ref 0.0–1.2)
Total Protein: 7.6 g/dL (ref 6.5–8.1)

## 2023-09-22 LAB — URINALYSIS, ROUTINE W REFLEX MICROSCOPIC
Bilirubin Urine: NEGATIVE
Glucose, UA: NEGATIVE mg/dL
Ketones, ur: NEGATIVE mg/dL
Nitrite: NEGATIVE
Protein, ur: NEGATIVE mg/dL
RBC / HPF: >50 RBC/hpf (ref 0–5)
Specific Gravity, Urine: 1.028 (ref 1.005–1.030)
WBC, UA: >50 WBC/hpf (ref 0–5)
pH: 5 (ref 5.0–8.0)

## 2023-09-22 LAB — CBC WITH DIFFERENTIAL/PLATELET
Abs Immature Granulocytes: 0.01 K/uL (ref 0.00–0.07)
Basophils Absolute: 0 K/uL (ref 0.0–0.1)
Basophils Relative: 0 %
Eosinophils Absolute: 0.2 K/uL (ref 0.0–0.5)
Eosinophils Relative: 3 %
HCT: 41.8 % (ref 36.0–46.0)
Hemoglobin: 13.3 g/dL (ref 12.0–15.0)
Immature Granulocytes: 0 %
Lymphocytes Relative: 27 %
Lymphs Abs: 1.8 K/uL (ref 0.7–4.0)
MCH: 29.6 pg (ref 26.0–34.0)
MCHC: 31.8 g/dL (ref 30.0–36.0)
MCV: 93.1 fL (ref 80.0–100.0)
Monocytes Absolute: 0.4 K/uL (ref 0.1–1.0)
Monocytes Relative: 6 %
Neutro Abs: 4.3 K/uL (ref 1.7–7.7)
Neutrophils Relative %: 64 %
Platelets: 290 K/uL (ref 150–400)
RBC: 4.49 MIL/uL (ref 3.87–5.11)
RDW: 19 % — ABNORMAL HIGH (ref 11.5–15.5)
WBC: 6.7 K/uL (ref 4.0–10.5)
nRBC: 0 % (ref 0.0–0.2)

## 2023-09-22 LAB — PREGNANCY, URINE: Preg Test, Ur: NEGATIVE

## 2023-09-22 NOTE — ED Triage Notes (Signed)
 Pt reports having her menstrual for 3 weeks now of heavy bleeding with blood clots and severe abdominal pain. Pt reports this happening 1 year ago and having to receive a blood transfusion. Pt denies any medical hx.

## 2023-09-22 NOTE — ED Provider Notes (Signed)
 [facility] Provider Note    Event Date/Time   First MD Initiated Contact with Patient 09/22/23 1356     (approximate)   History   Abdominal Pain and Vaginal Bleeding   HPI  Julie Mcclain is a 28 y.o. female with a past medical history of anxiety, hyperlipidemia, depression who presents today for evaluation of vaginal bleeding and lower abdominal pain.  Patient reports that she has irregular periods, but has been bleeding for the past 3 weeks.  She reports that she recently stopped her birth control pills on Monday.  She reports mild low cramping consistent with her known menstrual cramps.  She has never had any OB/GYN imaging.  She is not currently on any hormones.  She denies any other vaginal discharge besides the blood.  No nausea or vomiting.  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   Gastritis 07/27/2022   Absolute anemia 07/27/2022   Obesity, Class III, BMI 40-49.9 (morbid obesity) (HCC) 07/27/2022   Abnormal urinalysis 07/27/2022   ADHD 07/14/2022   Bipolar 1 disorder (HCC) 07/14/2022   Anxiety 07/14/2022   Depression, recurrent (HCC) 07/14/2022   Generalized anxiety disorder 02/17/2022   Hyperlipidemia 02/17/2022   Prediabetes 02/17/2022   Generalized weakness 10/06/2021   Iron deficiency anemia due to chronic blood loss 05/04/2021   Menometrorrhagia 05/04/2021          Physical Exam   Triage Vital Signs: ED Triage Vitals  Encounter Vitals Group     BP 09/22/23 1101 (!) 140/100     Systolic BP Percentile --      Diastolic BP Percentile --      Pulse Rate 09/22/23 1101 91     Resp 09/22/23 1101 16     Temp 09/22/23 1101 98 F (36.7 C)     Temp Source 09/22/23 1101 Oral     SpO2 09/22/23 1101 100 %     Weight 09/22/23 1103 250 lb (113.4 kg)     Height 09/22/23 1103 5' 4 (1.626 m)     Head Circumference --       Peak Flow --      Pain Score 09/22/23 1102 10     Pain Loc --      Pain Education --      Exclude from Growth Chart --     Most recent vital signs: Vitals:   09/22/23 1101 09/22/23 1444  BP: (!) 140/100 119/73  Pulse: 91 87  Resp: 16 16  Temp: 98 F (36.7 C) 97.9 F (36.6 C)  SpO2: 100% 100%    Physical Exam Vitals and nursing note reviewed.  Constitutional:      General: Awake and alert. No acute distress.    Appearance: Normal appearance. The patient is normal weight.  HENT:     Head: Normocephalic and atraumatic.     Mouth: Mucous membranes are moist.  Eyes:     General: PERRL. Normal EOMs        Right eye: No discharge.        Left eye: No discharge.     Conjunctiva/sclera: Conjunctivae normal.  Cardiovascular:     Rate and Rhythm: Normal rate and regular rhythm.     Pulses: Normal pulses.     Heart sounds: Normal heart sounds Pulmonary:     Effort: Pulmonary effort is normal. No respiratory distress.  Breath sounds: Normal breath sounds.  Abdominal:     Abdomen is soft. There is no abdominal tenderness. No rebound or guarding. No distention. Musculoskeletal:        General: No swelling. Normal range of motion.     Cervical back: Normal range of motion and neck supple.  Skin:    General: Skin is warm and dry.     Capillary Refill: Capillary refill takes less than 2 seconds.     Findings: No rash.  Neurological:     Mental Status: The patient is awake and alert.      ED Results / Procedures / Treatments   Labs (all labs ordered are listed, but only abnormal results are displayed) Labs Reviewed  CBC WITH DIFFERENTIAL/PLATELET - Abnormal; Notable for the following components:      Result Value   RDW 19.0 (*)    All other components within normal limits  URINALYSIS, ROUTINE W REFLEX MICROSCOPIC - Abnormal; Notable for the following components:   Color, Urine YELLOW (*)    APPearance HAZY (*)    Hgb urine dipstick LARGE (*)    Leukocytes,Ua SMALL  (*)    Bacteria, UA RARE (*)    All other components within normal limits  COMPREHENSIVE METABOLIC PANEL  PREGNANCY, URINE     EKG     RADIOLOGY     PROCEDURES:  Critical Care performed:   Procedures   MEDICATIONS ORDERED IN ED: Medications - No data to display   IMPRESSION / MDM / ASSESSMENT AND PLAN / ED COURSE  I reviewed the triage vital signs and the nursing notes.   Differential diagnosis includes, but is not limited to, vaginal bleeding, fibroids, anemia, dysfunctional uterine bleeding, hormone abnormality.  Patient is awake and alert, hemodynamically stable and afebrile.  She is normotensive.  She has no reproducible abdominal tenderness on exam.  Labs obtained in triage are overall reassuring.  Her H&H is stable.  I reviewed the patient's chart.  She has recently established care with OB/GYN, most recently saw 08/29/2023 but it does not appear that she mention to her heavy bleeding.  Patient was also seen yesterday at [facility] for the same complaint.  She underwent laboratory testing.  No imaging was done.  Discussed her lab results today.  Patient is reassured.  Recommended that she undergo ultrasound for evaluation of possible fibroids or other etiology of her heavy bleeding.  Patient was initially amenable to this plan, however she did not want to wait any longer and ultimately requested to be discharged prior to ultrasound.  Recommended close outpatient follow-up and strict return precautions.  Patient understands and agrees with plan.  Discharged in stable condition.  Patient's presentation is most consistent with acute complicated illness / injury requiring diagnostic workup.   Clinical Course as of 09/22/23 1705  Thu Sep 22, 2023  1529 Patient does not wish to wait for her ultrasound anymore.  She is requesting to be discharged [JP]    Clinical Course User Index [JP] Cloma Rahrig E, PA-C     FINAL CLINICAL IMPRESSION(S) / ED DIAGNOSES    Final diagnoses:  Vaginal bleeding     Rx / DC Orders   ED Discharge Orders     None        Note:  This document was prepared using Dragon voice recognition software and may include unintentional dictation errors.   Jhon Mallozzi E, PA-C 09/22/23 1705    Jacolyn Pae, MD 09/22/23 1743

## 2023-09-22 NOTE — ED Provider Triage Note (Signed)
 Emergency Medicine Provider Triage Evaluation Note  Seretha Bernette Seeman , a 28 y.o. female  was evaluated in triage.  Pt complains of lower abdominal pain for a week. Has had vaginal bleeding on and off for 3 weeks. Stopped taking OCP birth control on Monday.   Review of Systems  Positive: Lower abdominal pain, vaginal bleeding, dizziness Negative: Urinary symptoms, n/v  Physical Exam  LMP 08/15/2023  Gen:   Awake, no distress   Resp:  Normal effort  MSK:   Moves extremities without difficulty  Other:    Medical Decision Making  Medically screening exam initiated at 11:02 AM.  Appropriate orders placed.  [employer] was informed that the remainder of the evaluation will be completed by another provider, this initial triage assessment does not replace that evaluation, and the importance of remaining in the ED until their evaluation is complete.     Cleaster Tinnie LABOR, PA-C 09/22/23 1104

## 2023-09-22 NOTE — Discharge Instructions (Signed)
 You did not wish to wait for your ultrasound today.  Please follow-up with your outpatient provider.  Please return for any new, worsening, or change in symptoms or other concerns.

## 2023-09-23 ENCOUNTER — Encounter: Payer: Self-pay | Admitting: Family

## 2023-09-23 ENCOUNTER — Ambulatory Visit (INDEPENDENT_AMBULATORY_CARE_PROVIDER_SITE_OTHER): Payer: 59 | Admitting: Family

## 2023-09-23 VITALS — BP 118/82 | HR 96 | Ht 64.0 in | Wt 267.2 lb

## 2023-09-23 DIAGNOSIS — N921 Excessive and frequent menstruation with irregular cycle: Secondary | ICD-10-CM

## 2023-09-23 DIAGNOSIS — R8290 Unspecified abnormal findings in urine: Secondary | ICD-10-CM

## 2023-09-23 DIAGNOSIS — F319 Bipolar disorder, unspecified: Secondary | ICD-10-CM | POA: Diagnosis not present

## 2023-09-23 DIAGNOSIS — Z0130 Encounter for examination of blood pressure without abnormal findings: Secondary | ICD-10-CM | POA: Diagnosis not present

## 2023-09-23 MED ORDER — APRI 0.15-30 MG-MCG PO TABS
1.0000 | ORAL_TABLET | Freq: Every day | ORAL | 2 refills | Status: DC
Start: 2023-09-23 — End: 2024-02-12

## 2023-09-23 MED ORDER — ARIPIPRAZOLE 10 MG PO TABS: 10.0000 mg | ORAL_TABLET | Freq: Every day | ORAL | 1 refills | Status: DC

## 2023-09-23 NOTE — Progress Notes (Signed)
 Established Patient Office Visit  Subjective:  Patient ID: Julie Mcclain, female    DOB: 19-Jan-1996  Age: 28 y.o. MRN: [id number]  Chief Complaint  Patient presents with   Follow-up    Follow up    Patient here today for follow-up after recent hospital visits. She went to the emergency room yesterday and the day before for excessive bleeding that had been occurring since she started OCPs 1 month ago. She says that she has had continuous bleeding since then. She previously has had very irregular periods anyway. She reports that she stopped taking the Junel on Monday.  Her UA when she was at the hospital did show that she had some white blood cells. It does not appear that this was sent for culture.  Her mother also says that she needs to ask about increasing her bipolar medicine as she has been having severe mood swings and anger.  No other concerns at this time    No other concerns at this time.   Past Medical History:  Diagnosis Date   IDA (iron deficiency anemia)    Menometrorrhagia    Obesity    Ovarian cyst    Snoring    Supervision of high risk pregnancy, antepartum 01/06/2022    Past Surgical History:  Procedure Laterality Date   denies      Social History   Socioeconomic History   Marital status: Single    Spouse name: Not on file   Number of children: 0   Years of education: 12   Highest education level: Not on file  Occupational History   Occupation: unemployed  Tobacco Use   Smoking status: Never    Passive exposure: Never   Smokeless tobacco: Never  Vaping Use   Vaping status: Never Used  Substance and Sexual Activity   Alcohol use: No   Drug use: No   Sexual activity: Yes    Birth control/protection: None  Other Topics Concern   Not on file  Social History Narrative   Not on file   Social Drivers of Health   Financial Resource Strain: Low Risk  (01/06/2022)   Overall Financial Resource Strain (CARDIA)    Difficulty of  Paying Living Expenses: Not hard at all  Food Insecurity: No Food Insecurity (06/22/2023)   Hunger Vital Sign    Worried About Running Out of Food in the Last Year: Never true    Ran Out of Food in the Last Year: Never true  Transportation Needs: No Transportation Needs (06/22/2023)   PRAPARE - Administrator, Civil Service (Medical): No    Lack of Transportation (Non-Medical): No  Physical Activity: Insufficiently Active (01/06/2022)   Exercise Vital Sign    Days of Exercise per Week: 1 day    Minutes of Exercise per Session: 30 min  Stress: No Stress Concern Present (01/06/2022)   [employer] of Occupational Health - Occupational Stress Questionnaire    Feeling of Stress : Only a little  Social Connections: Moderately Isolated (01/06/2022)   Social Connection and Isolation Panel [NHANES]    Frequency of Communication with Friends and Family: More than three times a week    Frequency of Social Gatherings with Friends and Family: More than three times a week    Attends Religious Services: More than 4 times per year    Active Member of [employer] or Organizations: No    Attends Banker Meetings: Never    Marital Status: Never married  Intimate  Partner Violence: Not At Risk (06/22/2023)   Humiliation, Afraid, Rape, and Kick questionnaire    Fear of Current or Ex-Partner: No    Emotionally Abused: No    Physically Abused: No    Sexually Abused: No    Family History  Problem Relation Age of Onset   Diabetes Father        blind in one eye    No Known Allergies  Review of Systems  Genitourinary:        Abnormal uterine bleeding. Abnormal UA in ED yesterday  All other systems reviewed and are negative.      Objective:   BP 118/82   Pulse 96   Ht 5' 4 (1.626 m)   Wt 267 lb 3.2 oz (121.2 kg)   LMP 08/15/2023   SpO2 99%   BMI 45.86 kg/m   Vitals:   09/23/23 1331  BP: 118/82  Pulse: 96  Height: 5' 4 (1.626 m)  Weight: 267 lb 3.2 oz (121.2 kg)   SpO2: 99%  BMI (Calculated): 45.84    Physical Exam Vitals and nursing note reviewed.  Constitutional:      Appearance: Normal appearance. She is normal weight.  HENT:     Head: Normocephalic.  Eyes:     Extraocular Movements: Extraocular movements intact.     Conjunctiva/sclera: Conjunctivae normal.     Pupils: Pupils are equal, round, and reactive to light.  Cardiovascular:     Rate and Rhythm: Normal rate.  Pulmonary:     Effort: Pulmonary effort is normal.  Neurological:     General: No focal deficit present.     Mental Status: She is alert and oriented to person, place, and time. Mental status is at baseline.  Psychiatric:        Attention and Perception: Perception normal. She is inattentive.        Mood and Affect: Affect normal. Mood is anxious.        Speech: Speech is rapid and pressured.        Behavior: Behavior is hyperactive. Behavior is cooperative.        Thought Content: Thought content normal.        Cognition and Memory: Cognition and memory normal.        Judgment: Judgment is impulsive.      No results found for any visits on 09/23/23.  Recent Results (from the past 2160 hours)  Iron and TIBC     Status: Abnormal   Collection Time: 08/18/23 12:58 PM  Result Value Ref Range   Iron 19 (L) 28 - 170 ug/dL   TIBC 596 749 - 549 ug/dL   Saturation Ratios 5 (L) 10.4 - 31.8 %   UIBC 384 ug/dL    Comment: Performed at [facility], [address] [place], [place] [address]  Ferritin     Status: Abnormal   Collection Time: 08/18/23 12:58 PM  Result Value Ref Range   Ferritin 9 (L) 11 - 307 ng/mL    Comment: Performed at [facility], [address] [place], [place] [address]  CBC     Status: Abnormal   Collection Time: 08/18/23 12:58 PM  Result Value Ref Range   WBC 9.9 4.0 - 10.5 K/uL   RBC 3.45 (L) 3.87 - 5.11 MIL/uL   Hemoglobin 9.1 (L) 12.0 - 15.0 g/dL   HCT 69.9 (L) 63.9 - 53.9 %   MCV 87.0 80.0 - 100.0 fL   MCH 26.4 26.0  - 34.0  pg   MCHC 30.3 30.0 - 36.0 g/dL   RDW 75.2 (H) 88.4 - 84.4 %   Platelets 370 150 - 400 K/uL   nRBC 0.0 0.0 - 0.2 %    Comment: Performed at [facility], [address] [place], [place] [address]  Cervicovaginal ancillary only     Status: Abnormal   Collection Time: 08/29/23  1:53 PM  Result Value Ref Range   Neisseria Gonorrhea Negative    Chlamydia Negative    Trichomonas Negative    Bacterial Vaginitis (gardnerella) Positive (A)    Candida Vaginitis Negative    Candida Glabrata Negative    Comment      Normal Reference Range Bacterial Vaginosis - Negative   Comment Normal Reference Ranger Chlamydia - Negative    Comment      Normal Reference Range Neisseria Gonorrhea - Negative   Comment Normal Reference Range Candida Species - Negative    Comment Normal Reference Range Candida Galbrata - Negative    Comment Normal Reference Range Trichomonas - Negative   Cytology - PAP     Status: None   Collection Time: 08/29/23  1:53 PM  Result Value Ref Range   Adequacy      Satisfactory but limited for evaluation with scant cellularity;   Adequacy transformation zone component present.    Diagnosis      - Negative for Intraepithelial Lesions or Malignancy (NILM)   Diagnosis - Benign reactive/reparative changes    Comment Cellular changes consistent with hyperkeratosis.   RPR     Status: Abnormal   Collection Time: 08/29/23  2:30 PM  Result Value Ref Range   RPR Ser Ql Reactive (A) Non Reactive  HIV Antibody (routine testing w rflx)     Status: None   Collection Time: 08/29/23  2:30 PM  Result Value Ref Range   HIV Screen 4th Generation wRfx Non Reactive Non Reactive    Comment: HIV-1/HIV-2 antibodies and HIV-1 p24 antigen were NOT detected. There is no laboratory evidence of HIV infection. HIV Negative   Hepatitis C antibody     Status: None   Collection Time: 08/29/23  2:30 PM  Result Value Ref Range   Hep C Virus Ab Non Reactive Non Reactive    Comment: HCV  antibody alone does not differentiate between previously resolved infection and active infection. Equivocal and Reactive HCV antibody results should be followed up with an HCV RNA test to support the diagnosis of active HCV infection.   Hepatitis B surface antigen     Status: None   Collection Time: 08/29/23  2:30 PM  Result Value Ref Range   Hepatitis B Surface Ag Negative Negative  CBC     Status: Abnormal   Collection Time: 08/29/23  2:30 PM  Result Value Ref Range   WBC 12.7 (H) 3.4 - 10.8 x10E3/uL   RBC 4.12 3.77 - 5.28 x10E6/uL   Hemoglobin 11.0 (L) 11.1 - 15.9 g/dL   Hematocrit 62.8 65.9 - 46.6 %   MCV 90 79 - 97 fL   MCH 26.7 26.6 - 33.0 pg   MCHC 29.6 (L) 31.5 - 35.7 g/dL   RDW 75.9 (H) 88.2 - 84.5 %   Platelets 414 150 - 450 x10E3/uL  Comprehensive metabolic panel     Status: Abnormal   Collection Time: 08/29/23  2:30 PM  Result Value Ref Range   Glucose 69 (L) 70 - 99 mg/dL   BUN 12 6 - 20 mg/dL   Creatinine, Ser  0.87 0.57 - 1.00 mg/dL   eGFR 92 >40 fO/fpw/8.26   BUN/Creatinine Ratio 14 9 - 23   Sodium 143 134 - 144 mmol/L   Potassium 4.3 3.5 - 5.2 mmol/L   Chloride 104 96 - 106 mmol/L   CO2 24 20 - 29 mmol/L   Calcium  9.1 8.7 - 10.2 mg/dL   Total Protein 7.1 6.0 - 8.5 g/dL   Albumin 4.4 4.0 - 5.0 g/dL   Globulin, Total 2.7 1.5 - 4.5 g/dL   Bilirubin Total 0.3 0.0 - 1.2 mg/dL   Alkaline Phosphatase 80 44 - 121 IU/L   AST 15 0 - 40 IU/L   ALT 15 0 - 32 IU/L  RPR, quant & T.pallidum antibodies     Status: Abnormal   Collection Time: 08/29/23  2:30 PM  Result Value Ref Range   Rapid Plasma Reagin, Quant 1:4 (H) NonRea<1:1 titer   T Pallidum Abs Reactive (A) Non Reactive   Interpretation: Comment     Comment: Syphilis: RPR with Reflex to RPR Titer and Treponemal           Antibodies, Traditional Screening and Diagnosis           Algorithm ------------------------------------------------------------                        Treponemal RPR        RPR, Qn          Ab       Final Interpretation --------   ---------   ----------   ------------------------ Non        N/A         N/A          No laboratory evidence Reactive                            of syphilis. Retest in                                     2-4 weeks if recent                                     exposure us  suspected. --------   ---------   ----------   ------------------------ Reactive   >/=1:1      Non          Nontreponemal antibodies                        Reactive     detected. Syphilis                                     unlikely; biological                                     false positive possible.                                     Retest in 2-4 weeks if  recent exposure  is                                     suspected. --------   ---------   ----------   ------------------------ Reactive   >/=1:1      Reactive     Treponemal and                                     nontreponemal antibodies                                     detected. Consistent                                     with past or current                                     (potential early)                                     syphilis.   Comprehensive metabolic panel     Status: None   Collection Time: 09/22/23 11:04 AM  Result Value Ref Range   Sodium 142 135 - 145 mmol/L   Potassium 3.8 3.5 - 5.1 mmol/L   Chloride 107 98 - 111 mmol/L   CO2 26 22 - 32 mmol/L   Glucose, Bld 92 70 - 99 mg/dL    Comment: Glucose reference range applies only to samples taken after fasting for at least 8 hours.   BUN 14 6 - 20 mg/dL   Creatinine, Ser 9.11 0.44 - 1.00 mg/dL   Calcium  8.9 8.9 - 10.3 mg/dL   Total Protein 7.6 6.5 - 8.1 g/dL   Albumin 4.1 3.5 - 5.0 g/dL   AST 20 15 - 41 U/L   ALT 20 0 - 44 U/L   Alkaline Phosphatase 46 38 - 126 U/L   Total Bilirubin 0.4 0.0 - 1.2 mg/dL   GFR, Estimated >39 >39 mL/min    Comment: (NOTE) Calculated using the CKD-EPI Creatinine Equation  (2021)    Anion gap 9 5 - 15    Comment: Performed at [facility], [address] [place], [place] [address]  CBC with Differential     Status: Abnormal   Collection Time: 09/22/23 11:04 AM  Result Value Ref Range   WBC 6.7 4.0 - 10.5 K/uL   RBC 4.49 3.87 - 5.11 MIL/uL   Hemoglobin 13.3 12.0 - 15.0 g/dL   HCT 58.1 63.9 - 53.9 %   MCV 93.1 80.0 - 100.0 fL   MCH 29.6 26.0 - 34.0 pg   MCHC 31.8 30.0 - 36.0 g/dL   RDW 80.9 (H) 88.4 - 84.4 %   Platelets 290 150 - 400 K/uL   nRBC 0.0 0.0 - 0.2 %   Neutrophils Relative % 64 %   Neutro Abs 4.3 1.7 - 7.7 K/uL   Lymphocytes Relative 27 %   Lymphs Abs 1.8 0.7 - 4.0 K/uL   Monocytes Relative  6 %   Monocytes Absolute 0.4 0.1 - 1.0 K/uL   Eosinophils Relative 3 %   Eosinophils Absolute 0.2 0.0 - 0.5 K/uL   Basophils Relative 0 %   Basophils Absolute 0.0 0.0 - 0.1 K/uL   Immature Granulocytes 0 %   Abs Immature Granulocytes 0.01 0.00 - 0.07 K/uL    Comment: Performed at [facility], [address] [place], [place] [address]  Urinalysis, Routine w reflex microscopic -Urine, Clean Catch     Status: Abnormal   Collection Time: 09/22/23 11:04 AM  Result Value Ref Range   Color, Urine YELLOW (A) YELLOW   APPearance HAZY (A) CLEAR   Specific Gravity, Urine 1.028 1.005 - 1.030   pH 5.0 5.0 - 8.0   Glucose, UA NEGATIVE NEGATIVE mg/dL   Hgb urine dipstick LARGE (A) NEGATIVE   Bilirubin Urine NEGATIVE NEGATIVE   Ketones, ur NEGATIVE NEGATIVE mg/dL   Protein, ur NEGATIVE NEGATIVE mg/dL   Nitrite NEGATIVE NEGATIVE   Leukocytes,Ua SMALL (A) NEGATIVE   RBC / HPF >50 0 - 5 RBC/hpf   WBC, UA >50 0 - 5 WBC/hpf   Bacteria, UA RARE (A) NONE SEEN   Squamous Epithelial / HPF 0-5 0 - 5 /HPF   Mucus PRESENT     Comment: Performed at [facility], [address] [place], [place] [address]  Pregnancy, urine     Status: None   Collection Time: 09/22/23 11:04 AM  Result Value Ref Range   Preg Test, Ur NEGATIVE NEGATIVE     Comment:        THE SENSITIVITY OF THIS METHODOLOGY IS >25 mIU/mL. Performed at [facility], [address] [place], [place] [address]        Assessment & Plan:   Problem List Items Addressed This Visit       Other   Menometrorrhagia   Changing OCP to Apri  to see if we are able to control the bleeding more effectively.  Reassured pt that it is not unusual to have breakthrough bleeding when starting a new OCP.   Will reassess at follow up.      Bipolar 1 disorder (HCC)   Increasing Abilify .   Patient has been stable on these medications for a while, so I think this is likely the best course of action for her.   Will reassess at follow up.      Obesity, Class III, BMI 40-49.9 (morbid obesity) (HCC)   Continue current meds.  Will adjust as needed based on results.  The patient is asked to make an attempt to improve diet and exercise patterns to aid in medical management of this problem. Addressed importance of increasing and maintaining water intake.        Abnormal urinalysis - Primary   Sending UA/UC to [facility] since this was not done with the ED.  Will call patient with results and send abx if indicated        Relevant Orders   Urine Culture    Return in about 1 month (around 10/24/2023) for F/U.   Total time spent: 20 minutes  ALAN CHRISTELLA ARRANT, FNP  09/23/2023   This document may have been prepared by [facility] Voice Recognition software and as such may include unintentional dictation errors.

## 2023-09-24 NOTE — Assessment & Plan Note (Signed)
 Continue current meds.  Will adjust as needed based on results.  The patient is asked to make an attempt to improve diet and exercise patterns to aid in medical management of this problem. Addressed importance of increasing and maintaining water intake.

## 2023-09-24 NOTE — Assessment & Plan Note (Signed)
 Sending UA/UC to [facility] since this was not done with the ED.  Will call patient with results and send abx if indicated

## 2023-09-24 NOTE — Assessment & Plan Note (Signed)
 Increasing Abilify.   Patient has been stable on these medications for a while, so I think this is likely the best course of action for her.   Will reassess at follow up.

## 2023-09-24 NOTE — Assessment & Plan Note (Signed)
 Changing OCP to Apri to see if we are able to control the bleeding more effectively.  Reassured pt that it is not unusual to have breakthrough bleeding when starting a new OCP.   Will reassess at follow up.

## 2023-09-28 LAB — URINE CULTURE

## 2023-09-29 ENCOUNTER — Encounter: Payer: Self-pay | Admitting: Oncology

## 2023-09-29 ENCOUNTER — Telehealth: Payer: Self-pay | Admitting: Family

## 2023-09-29 ENCOUNTER — Other Ambulatory Visit: Payer: Self-pay | Admitting: Family

## 2023-09-29 ENCOUNTER — Other Ambulatory Visit: Payer: 59

## 2023-09-29 DIAGNOSIS — Z202 Contact with and (suspected) exposure to infections with a predominantly sexual mode of transmission: Secondary | ICD-10-CM

## 2023-09-29 NOTE — Telephone Encounter (Signed)
Patient called in wanting STD and HIV labs drawn because her "girl" woke up with a "bump on her lip" this morning. Please place orders and let me know when they're in so I can let her know when to come have the labs drawn.

## 2023-09-30 LAB — RPR, QUANT: RPR, Quant: 1:4 {titer} — ABNORMAL HIGH

## 2023-09-30 LAB — HEPATITIS B CORE ANTIBODY, TOTAL: Hep B Core Total Ab: NEGATIVE

## 2023-09-30 LAB — RPR W/REFLEX TO TREPSURE: RPR: REACTIVE — AB

## 2023-09-30 LAB — HIV ANTIBODY (ROUTINE TESTING W REFLEX): HIV Screen 4th Generation wRfx: NONREACTIVE

## 2023-10-01 ENCOUNTER — Encounter: Payer: Self-pay | Admitting: Family

## 2023-10-01 MED ORDER — NITROFURANTOIN MONOHYD MACRO 100 MG PO CAPS: 100.0000 mg | ORAL_CAPSULE | Freq: Two times a day (BID) | ORAL | 0 refills | Status: DC

## 2023-10-02 LAB — NUSWAB VG+, HSV
Candida albicans, NAA: NEGATIVE
Candida glabrata, NAA: NEGATIVE
Chlamydia trachomatis, NAA: NEGATIVE
HSV 1 NAA: NEGATIVE
HSV 2 NAA: NEGATIVE
Megasphaera 1: HIGH {score} — AB
Neisseria gonorrhoeae, NAA: NEGATIVE
Trich vag by NAA: NEGATIVE

## 2023-10-03 NOTE — Progress Notes (Signed)
Informed via Mychart message

## 2023-10-24 ENCOUNTER — Ambulatory Visit (INDEPENDENT_AMBULATORY_CARE_PROVIDER_SITE_OTHER): Payer: 59 | Admitting: Family

## 2023-10-24 ENCOUNTER — Encounter: Payer: Self-pay | Admitting: Family

## 2023-10-24 ENCOUNTER — Inpatient Hospital Stay: Payer: Medicaid Other

## 2023-10-24 VITALS — BP 116/84 | Ht 64.0 in | Wt 266.4 lb

## 2023-10-24 DIAGNOSIS — F319 Bipolar disorder, unspecified: Secondary | ICD-10-CM | POA: Diagnosis not present

## 2023-10-24 DIAGNOSIS — Z0130 Encounter for examination of blood pressure without abnormal findings: Secondary | ICD-10-CM | POA: Diagnosis not present

## 2023-10-24 DIAGNOSIS — R7303 Prediabetes: Secondary | ICD-10-CM | POA: Diagnosis not present

## 2023-10-24 DIAGNOSIS — F909 Attention-deficit hyperactivity disorder, unspecified type: Secondary | ICD-10-CM

## 2023-10-25 ENCOUNTER — Inpatient Hospital Stay: Payer: Medicaid Other | Attending: Oncology

## 2023-10-25 DIAGNOSIS — N920 Excessive and frequent menstruation with regular cycle: Secondary | ICD-10-CM | POA: Insufficient documentation

## 2023-10-25 DIAGNOSIS — D500 Iron deficiency anemia secondary to blood loss (chronic): Secondary | ICD-10-CM | POA: Insufficient documentation

## 2023-10-25 LAB — CBC WITH DIFFERENTIAL (CANCER CENTER ONLY)
Abs Immature Granulocytes: 0.03 10*3/uL (ref 0.00–0.07)
Basophils Absolute: 0 10*3/uL (ref 0.0–0.1)
Basophils Relative: 0 %
Eosinophils Absolute: 0.2 10*3/uL (ref 0.0–0.5)
Eosinophils Relative: 2 %
HCT: 42.6 % (ref 36.0–46.0)
Hemoglobin: 13.5 g/dL (ref 12.0–15.0)
Immature Granulocytes: 0 %
Lymphocytes Relative: 21 %
Lymphs Abs: 1.9 10*3/uL (ref 0.7–4.0)
MCH: 29.6 pg (ref 26.0–34.0)
MCHC: 31.7 g/dL (ref 30.0–36.0)
MCV: 93.4 fL (ref 80.0–100.0)
Monocytes Absolute: 0.5 10*3/uL (ref 0.1–1.0)
Monocytes Relative: 6 %
Neutro Abs: 6.6 10*3/uL (ref 1.7–7.7)
Neutrophils Relative %: 71 %
Platelet Count: 314 10*3/uL (ref 150–400)
RBC: 4.56 MIL/uL (ref 3.87–5.11)
RDW: 14.8 % (ref 11.5–15.5)
WBC Count: 9.3 10*3/uL (ref 4.0–10.5)
nRBC: 0 % (ref 0.0–0.2)

## 2023-10-25 LAB — IRON AND TIBC
Iron: 81 ug/dL (ref 28–170)
Saturation Ratios: 20 % (ref 10.4–31.8)
TIBC: 412 ug/dL (ref 250–450)
UIBC: 331 ug/dL

## 2023-10-25 LAB — FERRITIN: Ferritin: 12 ng/mL (ref 11–307)

## 2023-10-31 ENCOUNTER — Inpatient Hospital Stay: Payer: 59

## 2023-11-03 ENCOUNTER — Other Ambulatory Visit: Payer: Self-pay | Admitting: Oncology

## 2023-11-03 ENCOUNTER — Inpatient Hospital Stay: Payer: Medicaid Other

## 2023-11-03 VITALS — BP 122/78 | HR 85 | Temp 97.7°F | Resp 20

## 2023-11-03 DIAGNOSIS — D500 Iron deficiency anemia secondary to blood loss (chronic): Secondary | ICD-10-CM

## 2023-11-03 MED ORDER — SODIUM CHLORIDE 0.9 % IV SOLN
INTRAVENOUS | Status: DC
  Filled 2023-11-03: qty 250

## 2023-11-03 MED ORDER — SODIUM CHLORIDE 0.9 % IV SOLN
510.0000 mg | INTRAVENOUS | Status: DC
  Administered 2023-11-03: 510 mg via INTRAVENOUS
  Filled 2023-11-03: qty 510

## 2023-11-03 NOTE — Progress Notes (Signed)
Unable to stay 30 minute post-observation due to [employer] arriving. Aware of risks and s/sx to watch for. Vitals stable at discharge.

## 2023-11-03 NOTE — Patient Instructions (Signed)

## 2023-11-04 ENCOUNTER — Other Ambulatory Visit: Payer: Self-pay

## 2023-11-04 MED ORDER — METRONIDAZOLE 500 MG PO TABS: 500.0000 mg | ORAL_TABLET | Freq: Two times a day (BID) | ORAL | 0 refills | Status: AC

## 2023-11-07 ENCOUNTER — Inpatient Hospital Stay: Payer: Medicaid Other

## 2023-12-13 ENCOUNTER — Other Ambulatory Visit: Payer: Self-pay | Admitting: Family

## 2023-12-13 DIAGNOSIS — R051 Acute cough: Secondary | ICD-10-CM | POA: Diagnosis not present

## 2023-12-13 DIAGNOSIS — J3489 Other specified disorders of nose and nasal sinuses: Secondary | ICD-10-CM | POA: Diagnosis not present

## 2023-12-13 DIAGNOSIS — R0981 Nasal congestion: Secondary | ICD-10-CM | POA: Diagnosis not present

## 2023-12-13 DIAGNOSIS — J069 Acute upper respiratory infection, unspecified: Secondary | ICD-10-CM | POA: Diagnosis not present

## 2023-12-17 ENCOUNTER — Encounter: Payer: Self-pay | Admitting: Family

## 2023-12-17 NOTE — Assessment & Plan Note (Signed)
 Patient stable.  Well controlled with current therapy.   Continue current meds.

## 2023-12-17 NOTE — Progress Notes (Signed)
 Established Patient Office Visit  Subjective:  Patient ID: Julie Mcclain, female    DOB: February 28, 1996  Age: 28 y.o. MRN: [id number]  Chief Complaint  Patient presents with   Follow-up    1 month follow up    Patient is here today for her 1 month follow up.  She has been feeling well since last appointment.   She does not have additional concerns to discuss today.  Labs are not due today. She needs refills.   I have reviewed her active problem list, medication list, allergies, notes from last encounter, lab results for her appointment today.      No other concerns at this time.   Past Medical History:  Diagnosis Date   IDA (iron deficiency anemia)    Menometrorrhagia    Obesity    Ovarian cyst    Snoring    Supervision of high risk pregnancy, antepartum 01/06/2022    Past Surgical History:  Procedure Laterality Date   denies      Social History   Socioeconomic History   Marital status: Single    Spouse name: Not on file   Number of children: 0   Years of education: 12   Highest education level: Not on file  Occupational History   Occupation: unemployed  Tobacco Use   Smoking status: Never    Passive exposure: Never   Smokeless tobacco: Never  Vaping Use   Vaping status: Never Used  Substance and Sexual Activity   Alcohol use: No   Drug use: No   Sexual activity: Yes    Birth control/protection: None  Other Topics Concern   Not on file  Social History Narrative   Not on file   Social Drivers of Health   Financial Resource Strain: Low Risk  (01/06/2022)   Overall Financial Resource Strain (CARDIA)    Difficulty of Paying Living Expenses: Not hard at all  Food Insecurity: No Food Insecurity (06/22/2023)   Hunger Vital Sign    Worried About Running Out of Food in the Last Year: Never true    Ran Out of Food in the Last Year: Never true  Transportation Needs: No Transportation Needs (06/22/2023)   PRAPARE - Scientist, research (physical sciences) (Medical): No    Lack of Transportation (Non-Medical): No  Physical Activity: Insufficiently Active (01/06/2022)   Exercise Vital Sign    Days of Exercise per Week: 1 day    Minutes of Exercise per Session: 30 min  Stress: No Stress Concern Present (01/06/2022)   [employer] of Occupational Health - Occupational Stress Questionnaire    Feeling of Stress : Only a little  Social Connections: Moderately Isolated (01/06/2022)   Social Connection and Isolation Panel [NHANES]    Frequency of Communication with Friends and Family: More than three times a week    Frequency of Social Gatherings with Friends and Family: More than three times a week    Attends Religious Services: More than 4 times per year    Active Member of [employer] or Organizations: No    Attends Banker Meetings: Never    Marital Status: Never married  Intimate Partner Violence: Not At Risk (06/22/2023)   Humiliation, Afraid, Rape, and Kick questionnaire    Fear of Current or Ex-Partner: No    Emotionally Abused: No    Physically Abused: No    Sexually Abused: No    Family History  Problem Relation Age of Onset   Diabetes Father  blind in one eye    No Known Allergies  Review of Systems  All other systems reviewed and are negative.      Objective:   BP 116/84   Ht 5\' 4"  (1.626 m)   Wt 266 lb 6.4 oz (120.8 kg)   BMI 45.73 kg/m   Vitals:   10/24/23 1047  BP: 116/84  Height: 5\' 4"  (1.626 m)  Weight: 266 lb 6.4 oz (120.8 kg)  BMI (Calculated): 45.7    Physical Exam Vitals and nursing note reviewed.  Constitutional:      Appearance: Normal appearance. She is normal weight.  HENT:     Head: Normocephalic.  Eyes:     Extraocular Movements: Extraocular movements intact.     Conjunctiva/sclera: Conjunctivae normal.     Pupils: Pupils are equal, round, and reactive to light.  Cardiovascular:     Rate and Rhythm: Normal rate.  Pulmonary:     Effort: Pulmonary effort  is normal.  Neurological:     General: No focal deficit present.     Mental Status: She is alert and oriented to person, place, and time. Mental status is at baseline.  Psychiatric:        Mood and Affect: Mood normal.        Behavior: Behavior normal.        Thought Content: Thought content normal.      No results found for any visits on 10/24/23.  Recent Results (from the past 2160 hours)  Comprehensive metabolic panel     Status: None   Collection Time: 09/22/23 11:04 AM  Result Value Ref Range   Sodium 142 135 - 145 mmol/L   Potassium 3.8 3.5 - 5.1 mmol/L   Chloride 107 98 - 111 mmol/L   CO2 26 22 - 32 mmol/L   Glucose, Bld 92 70 - 99 mg/dL    Comment: Glucose reference range applies only to samples taken after fasting for at least 8 hours.   BUN 14 6 - 20 mg/dL   Creatinine, Ser 8.29 0.44 - 1.00 mg/dL   Calcium 8.9 8.9 - 56.2 mg/dL   Total Protein 7.6 6.5 - 8.1 g/dL   Albumin 4.1 3.5 - 5.0 g/dL   AST 20 15 - 41 U/L   ALT 20 0 - 44 U/L   Alkaline Phosphatase 46 38 - 126 U/L   Total Bilirubin 0.4 0.0 - 1.2 mg/dL   GFR, Estimated >13 >08 mL/min    Comment: (NOTE) Calculated using the CKD-EPI Creatinine Equation (2021)    Anion gap 9 5 - 15    Comment: Performed at [facility], [address] [place], [place] [address]  CBC with Differential     Status: Abnormal   Collection Time: 09/22/23 11:04 AM  Result Value Ref Range   WBC 6.7 4.0 - 10.5 K/uL   RBC 4.49 3.87 - 5.11 MIL/uL   Hemoglobin 13.3 12.0 - 15.0 g/dL   HCT 69.6 29.5 - 28.4 %   MCV 93.1 80.0 - 100.0 fL   MCH 29.6 26.0 - 34.0 pg   MCHC 31.8 30.0 - 36.0 g/dL   RDW 13.2 (H) 44.0 - 10.2 %   Platelets 290 150 - 400 K/uL   nRBC 0.0 0.0 - 0.2 %   Neutrophils Relative % 64 %   Neutro Abs 4.3 1.7 - 7.7 K/uL   Lymphocytes Relative 27 %   Lymphs Abs 1.8 0.7 - 4.0 K/uL   Monocytes Relative 6 %  Monocytes Absolute 0.4 0.1 - 1.0 K/uL   Eosinophils Relative 3 %   Eosinophils Absolute 0.2 0.0 - 0.5  K/uL   Basophils Relative 0 %   Basophils Absolute 0.0 0.0 - 0.1 K/uL   Immature Granulocytes 0 %   Abs Immature Granulocytes 0.01 0.00 - 0.07 K/uL    Comment: Performed at [facility], [address] [place], [place] [address]  Urinalysis, Routine w reflex microscopic -Urine, Clean Catch     Status: Abnormal   Collection Time: 09/22/23 11:04 AM  Result Value Ref Range   Color, Urine YELLOW (A) YELLOW   APPearance HAZY (A) CLEAR   Specific Gravity, Urine 1.028 1.005 - 1.030   pH 5.0 5.0 - 8.0   Glucose, UA NEGATIVE NEGATIVE mg/dL   Hgb urine dipstick LARGE (A) NEGATIVE   Bilirubin Urine NEGATIVE NEGATIVE   Ketones, ur NEGATIVE NEGATIVE mg/dL   Protein, ur NEGATIVE NEGATIVE mg/dL   Nitrite NEGATIVE NEGATIVE   Leukocytes,Ua SMALL (A) NEGATIVE   RBC / HPF >50 0 - 5 RBC/hpf   WBC, UA >50 0 - 5 WBC/hpf   Bacteria, UA RARE (A) NONE SEEN   Squamous Epithelial / HPF 0-5 0 - 5 /HPF   Mucus PRESENT     Comment: Performed at [facility], [address] [place], [place] [address]  Pregnancy, urine     Status: None   Collection Time: 09/22/23 11:04 AM  Result Value Ref Range   Preg Test, Ur NEGATIVE NEGATIVE    Comment:        THE SENSITIVITY OF THIS METHODOLOGY IS >25 mIU/mL. Performed at [facility], [address], [place], [place] [address]   Urine Culture     Status: Abnormal   Collection Time: 09/23/23  3:16 PM   Specimen: Urine, Clean Catch   UC  Result Value Ref Range   Urine Culture, Routine Final report (A)    Organism ID, Bacteria Escherichia coli (A)     Comment: Cefazolin <=4 ug/mL Cefazolin with an MIC <=16 predicts susceptibility to the oral agents cefaclor, cefdinir, cefpodoxime, cefprozil, cefuroxime, cephalexin, and loracarbef when used for therapy of uncomplicated urinary tract infections due to E. coli, Klebsiella pneumoniae, and Proteus mirabilis. Multi-Drug Resistant Organism Greater than 100,000 colony forming units per  mL    Antimicrobial Susceptibility Comment     Comment:       ** S = Susceptible; I = Intermediate; R = Resistant **                    P = Positive; N = Negative             MICS are expressed in micrograms per mL    Antibiotic                 RSLT#1    RSLT#2    RSLT#3    RSLT#4 Amoxicillin/Clavulanic Acid    S Ampicillin                     R Cefepime                       S Ceftriaxone                    S Cefuroxime                     S Ciprofloxacin  S Ertapenem                      S Gentamicin                     R Imipenem                       S Levofloxacin                   S Meropenem                      S Nitrofurantoin                 S Piperacillin/Tazobactam        S Tetracycline                   R Tobramycin                     I Trimethoprim/Sulfa             R   HIV antibody (with reflex)     Status: None   Collection Time: 09/29/23 10:38 AM  Result Value Ref Range   HIV Screen 4th Generation wRfx Non Reactive Non Reactive    Comment: HIV-1/HIV-2 antibodies and HIV-1 p24 antigen were NOT detected. There is no laboratory evidence of HIV infection. HIV Negative   RPR w/reflex to TrepSure     Status: Abnormal   Collection Time: 09/29/23 10:38 AM  Result Value Ref Range   RPR Reactive (A) Non Reactive  Hepatitis B core Ab, Total     Status: None   Collection Time: 09/29/23 10:38 AM  Result Value Ref Range   Hep B Core Total Ab Negative Negative  RPR, Quant     Status: Abnormal   Collection Time: 09/29/23 10:38 AM  Result Value Ref Range   RPR, Quant 1:4 (H) NonRea<1:1 titer    Comment: This test is intended ONLY for specimens that have tested positive (reactive) or equivocal for Treponema pallidum antibodies prior to submission for testing. For the full CDC-recommended syphilis screening and diagnosis algorithm, [facility] offers test code 012005 RPR, Rfx Qn RPR/Confirm TP or 213086 T pallidum Screening Cascade.   NuSwab VG+, HSV      Status: Abnormal   Collection Time: 09/29/23  3:00 PM  Result Value Ref Range   Atopobium vaginae Moderate - 1 Score   BVAB 2 Low - 0 Score   Megasphaera 1 High - 2 (A) Score    Comment: Calculate total score by adding the 3 individual bacterial vaginosis (BV) marker scores together.  Total score is interpreted as follows: Total score 0-1: Indicates the absence of BV. Total score   2: Indeterminate for BV. Additional clinical                  data should be evaluated to establish a                  diagnosis. Total score 3-6: Indicates the presence of BV.    Candida albicans, NAA Negative Negative   Candida glabrata, NAA Negative Negative   Trich vag by NAA Negative Negative   Chlamydia trachomatis, NAA Negative Negative   Neisseria gonorrhoeae, NAA Negative Negative   HSV 1 NAA Negative Negative   HSV 2 NAA Negative Negative  Iron and TIBC([facility]/Sun[facility])  Status: None   Collection Time: 10/25/23  3:17 PM  Result Value Ref Range   Iron 81 28 - 170 ug/dL   TIBC 578 469 - 629 ug/dL   Saturation Ratios 20 10.4 - 31.8 %   UIBC 331 ug/dL    Comment: Performed at [facility], [address] [place], [place] [address]  Ferritin     Status: None   Collection Time: 10/25/23  3:17 PM  Result Value Ref Range   Ferritin 12 11 - 307 ng/mL    Comment: Performed at [facility], [address] [place], [place] [address]  CBC with Differential (Cancer Center Only)     Status: None   Collection Time: 10/25/23  3:17 PM  Result Value Ref Range   WBC Count 9.3 4.0 - 10.5 K/uL   RBC 4.56 3.87 - 5.11 MIL/uL   Hemoglobin 13.5 12.0 - 15.0 g/dL   HCT 10.2 72.5 - 36.6 %   MCV 93.4 80.0 - 100.0 fL   MCH 29.6 26.0 - 34.0 pg   MCHC 31.7 30.0 - 36.0 g/dL   RDW 44.0 34.7 - 42.5 %   Platelet Count 314 150 - 400 K/uL   nRBC 0.0 0.0 - 0.2 %   Neutrophils Relative % 71 %   Neutro Abs 6.6 1.7 - 7.7 K/uL   Lymphocytes Relative 21 %   Lymphs Abs 1.9 0.7 - 4.0 K/uL    Monocytes Relative 6 %   Monocytes Absolute 0.5 0.1 - 1.0 K/uL   Eosinophils Relative 2 %   Eosinophils Absolute 0.2 0.0 - 0.5 K/uL   Basophils Relative 0 %   Basophils Absolute 0.0 0.0 - 0.1 K/uL   Immature Granulocytes 0 %   Abs Immature Granulocytes 0.03 0.00 - 0.07 K/uL    Comment: Performed at [facility], [address] [place], [place] [address]       Assessment & Plan:   Problem List Items Addressed This Visit       Other   ADHD   Patient stable.  Well controlled with current therapy.   Continue current meds.        Bipolar 1 disorder (HCC) - Primary   Patient stable.  Well controlled with current therapy.   Continue current meds.        Obesity, Class III, BMI 40-49.9 (morbid obesity) (HCC)   Continue current meds.  Will adjust as needed based on results.  The patient is asked to make an attempt to improve diet and exercise patterns to aid in medical management of this problem. Addressed importance of increasing and maintaining water intake.        Prediabetes   A1C Continues to be in prediabetic ranges.  Will reassess at follow up after next lab check.  Patient counseled on dietary choices and verbalized understanding.  Patient educated on foods that contain carbohydrates and the need to decrease intake.  We discussed prediabetes, and what it means and the need for strict dietary control to prevent progression to type 2 diabetes.  Advised to decrease intake of sugary drinks, including sodas, sweet tea, and some juices, and of starch and sugar heavy foods (ie., potatoes, rice, bread, pasta, desserts). She verbalizes understanding and agreement with the changes discussed today.         Return in about 3 months (around 01/21/2024).   Total time spent: 20 minutes  Miki Kins, FNP  10/24/2023   This document may have been prepared by  Conservation officer, historic buildings and as such may include unintentional dictation errors.

## 2023-12-17 NOTE — Assessment & Plan Note (Signed)
 Continue current meds.  Will adjust as needed based on results.  The patient is asked to make an attempt to improve diet and exercise patterns to aid in medical management of this problem. Addressed importance of increasing and maintaining water intake.

## 2023-12-17 NOTE — Assessment & Plan Note (Signed)

## 2023-12-21 ENCOUNTER — Other Ambulatory Visit: Payer: 59

## 2023-12-23 ENCOUNTER — Ambulatory Visit (INDEPENDENT_AMBULATORY_CARE_PROVIDER_SITE_OTHER): Payer: 59 | Admitting: Family

## 2023-12-23 ENCOUNTER — Encounter: Payer: Self-pay | Admitting: Family

## 2023-12-23 VITALS — BP 108/82 | HR 82 | Ht 64.0 in | Wt 263.4 lb

## 2023-12-23 DIAGNOSIS — Z0130 Encounter for examination of blood pressure without abnormal findings: Secondary | ICD-10-CM

## 2023-12-23 DIAGNOSIS — F411 Generalized anxiety disorder: Secondary | ICD-10-CM

## 2023-12-23 DIAGNOSIS — F339 Major depressive disorder, recurrent, unspecified: Secondary | ICD-10-CM

## 2023-12-23 DIAGNOSIS — A539 Syphilis, unspecified: Secondary | ICD-10-CM

## 2023-12-23 DIAGNOSIS — F319 Bipolar disorder, unspecified: Secondary | ICD-10-CM | POA: Diagnosis not present

## 2023-12-24 ENCOUNTER — Encounter: Payer: Self-pay | Admitting: Family

## 2023-12-24 NOTE — Assessment & Plan Note (Signed)
 Sending referral to psych for patient.  Will defer to them for any further changes to treatment plan.

## 2023-12-24 NOTE — Assessment & Plan Note (Signed)
 Continue current meds.  Will adjust as needed based on results.  The patient is asked to make an attempt to improve diet and exercise patterns to aid in medical management of this problem. Addressed importance of increasing and maintaining water intake.

## 2023-12-24 NOTE — Progress Notes (Signed)
 Established Patient Office Visit  Subjective:  Patient ID: Julie Mcclain, female    DOB: 1995/12/21  Age: 28 y.o. MRN: [id number]  Chief Complaint  Patient presents with   Follow-up    2 month follow up    Patient is here for follow up.  She has been doing well since her last appointment, but reports that she has had an increase in stressors due to relationship issues.  She says that her mom would like her to get a referral to psychiatry.   Asks for repeat RPR as well.    No other concerns at this time.   Past Medical History:  Diagnosis Date   Abnormal urinalysis 07/27/2022   Formatting of this note might be different from the original. Last Assessment & Plan: Formatting of this note might be different from the original. Patient got a dose of Rocephin in the ED Will hold off on further antibiotics pending culture     Absolute anemia 07/27/2022   Formatting of this note might be different from the original. Last Assessment & Plan: Formatting of this note might be different from the original. 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 Suspect gastritis History of iron deficiency anemia History of menometrorrhagia Reports food-related epigastric pain consistent with gastritis Continue transfusion of 1 unit PRBCs started in the ED IV Protonix GI consu   Generalized weakness 10/06/2021   History of trichomonal vaginitis 08/29/2023   IDA (iron deficiency anemia)    Menometrorrhagia    Obesity    Ovarian cyst    Snoring    Supervision of high risk pregnancy, antepartum 01/06/2022    Past Surgical History:  Procedure Laterality Date   denies      Social History   Socioeconomic History   Marital status: Single    Spouse name: Not on file   Number of children: 0   Years of education: 12   Highest education level: Not on file  Occupational History   Occupation: unemployed  Tobacco Use   Smoking status: Never    Passive exposure: Never   Smokeless tobacco: Never   Vaping Use   Vaping status: Never Used  Substance and Sexual Activity   Alcohol use: No   Drug use: No   Sexual activity: Yes    Birth control/protection: None  Other Topics Concern   Not on file  Social History Narrative   Not on file   Social Drivers of Health   Financial Resource Strain: Low Risk  (01/06/2022)   Overall Financial Resource Strain (CARDIA)    Difficulty of Paying Living Expenses: Not hard at all  Food Insecurity: No Food Insecurity (06/22/2023)   Hunger Vital Sign    Worried About Running Out of Food in the Last Year: Never true    Ran Out of Food in the Last Year: Never true  Transportation Needs: No Transportation Needs (06/22/2023)   PRAPARE - Administrator, Civil Service (Medical): No    Lack of Transportation (Non-Medical): No  Physical Activity: Insufficiently Active (01/06/2022)   Exercise Vital Sign    Days of Exercise per Week: 1 day    Minutes of Exercise per Session: 30 min  Stress: No Stress Concern Present (01/06/2022)   [employer] of Occupational Health - Occupational Stress Questionnaire    Feeling of Stress : Only a little  Social Connections: Moderately Isolated (01/06/2022)   Social Connection and Isolation Panel [NHANES]    Frequency of Communication with Friends and Family: More  than three times a week    Frequency of Social Gatherings with Friends and Family: More than three times a week    Attends Religious Services: More than 4 times per year    Active Member of [employer] or Organizations: No    Attends Banker Meetings: Never    Marital Status: Never married  Intimate Partner Violence: Not At Risk (06/22/2023)   Humiliation, Afraid, Rape, and Kick questionnaire    Fear of Current or Ex-Partner: No    Emotionally Abused: No    Physically Abused: No    Sexually Abused: No    Family History  Problem Relation Age of Onset   Diabetes Father        blind in one eye    No Known Allergies  Review of  Systems  Psychiatric/Behavioral:  The patient is nervous/anxious.   All other systems reviewed and are negative.      Objective:   BP 108/82   Pulse 82   Ht 5\' 4"  (1.626 m)   Wt 263 lb 6.4 oz (119.5 kg)   SpO2 98%   BMI 45.21 kg/m   Vitals:   12/23/23 1406  BP: 108/82  Pulse: 82  Height: 5\' 4"  (1.626 m)  Weight: 263 lb 6.4 oz (119.5 kg)  SpO2: 98%  BMI (Calculated): 45.19    Physical Exam Vitals and nursing note reviewed.  Constitutional:      Appearance: Normal appearance. She is normal weight.  HENT:     Head: Normocephalic.  Eyes:     Extraocular Movements: Extraocular movements intact.     Conjunctiva/sclera: Conjunctivae normal.     Pupils: Pupils are equal, round, and reactive to light.  Cardiovascular:     Rate and Rhythm: Normal rate.  Pulmonary:     Effort: Pulmonary effort is normal.  Neurological:     General: No focal deficit present.     Mental Status: She is alert and oriented to person, place, and time. Mental status is at baseline.  Psychiatric:        Mood and Affect: Mood normal.        Behavior: Behavior normal.        Thought Content: Thought content normal.        Judgment: Judgment normal.      No results found for any visits on 12/23/23.  Recent Results (from the past 2160 hours)  HIV antibody (with reflex)     Status: None   Collection Time: 09/29/23 10:38 AM  Result Value Ref Range   HIV Screen 4th Generation wRfx Non Reactive Non Reactive    Comment: HIV-1/HIV-2 antibodies and HIV-1 p24 antigen were NOT detected. There is no laboratory evidence of HIV infection. HIV Negative   RPR w/reflex to TrepSure     Status: Abnormal   Collection Time: 09/29/23 10:38 AM  Result Value Ref Range   RPR Reactive (A) Non Reactive  Hepatitis B core Ab, Total     Status: None   Collection Time: 09/29/23 10:38 AM  Result Value Ref Range   Hep B Core Total Ab Negative Negative  RPR, Quant     Status: Abnormal   Collection Time: 09/29/23  10:38 AM  Result Value Ref Range   RPR, Quant 1:4 (H) NonRea<1:1 titer    Comment: This test is intended ONLY for specimens that have tested positive (reactive) or equivocal for Treponema pallidum antibodies prior to submission for testing. For the full CDC-recommended syphilis screening and diagnosis algorithm,  [facility] offers test code 012005 RPR, Rfx Qn RPR/Confirm TP or 132440 T pallidum Screening Cascade.   NuSwab VG+, HSV     Status: Abnormal   Collection Time: 09/29/23  3:00 PM  Result Value Ref Range   Atopobium vaginae Moderate - 1 Score   BVAB 2 Low - 0 Score   Megasphaera 1 High - 2 (A) Score    Comment: Calculate total score by adding the 3 individual bacterial vaginosis (BV) marker scores together.  Total score is interpreted as follows: Total score 0-1: Indicates the absence of BV. Total score   2: Indeterminate for BV. Additional clinical                  data should be evaluated to establish a                  diagnosis. Total score 3-6: Indicates the presence of BV.    Candida albicans, NAA Negative Negative   Candida glabrata, NAA Negative Negative   Trich vag by NAA Negative Negative   Chlamydia trachomatis, NAA Negative Negative   Neisseria gonorrhoeae, NAA Negative Negative   HSV 1 NAA Negative Negative   HSV 2 NAA Negative Negative  Iron and TIBC([facility])     Status: None   Collection Time: 10/25/23  3:17 PM  Result Value Ref Range   Iron 81 28 - 170 ug/dL   TIBC 102 725 - 366 ug/dL   Saturation Ratios 20 10.4 - 31.8 %   UIBC 331 ug/dL    Comment: Performed at [facility], [address] [place], [place] [address]  Ferritin     Status: None   Collection Time: 10/25/23  3:17 PM  Result Value Ref Range   Ferritin 12 11 - 307 ng/mL    Comment: Performed at [facility], [address] [place], [place] [address]  CBC with Differential (Cancer Center Only)     Status: None   Collection Time: 10/25/23  3:17 PM  Result Value  Ref Range   WBC Count 9.3 4.0 - 10.5 K/uL   RBC 4.56 3.87 - 5.11 MIL/uL   Hemoglobin 13.5 12.0 - 15.0 g/dL   HCT 56.3 87.5 - 64.3 %   MCV 93.4 80.0 - 100.0 fL   MCH 29.6 26.0 - 34.0 pg   MCHC 31.7 30.0 - 36.0 g/dL   RDW 32.9 51.8 - 84.1 %   Platelet Count 314 150 - 400 K/uL   nRBC 0.0 0.0 - 0.2 %   Neutrophils Relative % 71 %   Neutro Abs 6.6 1.7 - 7.7 K/uL   Lymphocytes Relative 21 %   Lymphs Abs 1.9 0.7 - 4.0 K/uL   Monocytes Relative 6 %   Monocytes Absolute 0.5 0.1 - 1.0 K/uL   Eosinophils Relative 2 %   Eosinophils Absolute 0.2 0.0 - 0.5 K/uL   Basophils Relative 0 %   Basophils Absolute 0.0 0.0 - 0.1 K/uL   Immature Granulocytes 0 %   Abs Immature Granulocytes 0.03 0.00 - 0.07 K/uL    Comment: Performed at [facility], [address] [place], [place] [address]       Assessment & Plan:   Problem List Items Addressed This Visit       Other   Bipolar 1 disorder (HCC)   Sending referral to psych for patient.  Will defer to them for any further changes to treatment plan.       Depression, recurrent (HCC)  Sending referral to psych for patient.  Will defer to them for any further changes to treatment plan.       Obesity, Class III, BMI 40-49.9 (morbid obesity) (HCC)   Generalized anxiety disorder   Sending referral to psych for patient.  Will defer to them for any further changes to treatment plan.       Other Visit Diagnoses       Syphilis (acquired)    -  Primary   Rechecking RPR level. Will call pt with results when available.   Relevant Orders   RPR w/reflex to TrepSure       Return in about 1 month (around 01/22/2024) for F/U.   Total time spent: 20 minutes  Trenda Frisk, FNP  12/23/2023   This document may have been prepared by [facility] Voice Recognition software and as such may include unintentional dictation errors.

## 2023-12-26 ENCOUNTER — Inpatient Hospital Stay: Payer: 59 | Attending: Oncology

## 2024-01-23 ENCOUNTER — Ambulatory Visit: Admitting: Family

## 2024-01-26 ENCOUNTER — Encounter: Payer: Self-pay | Admitting: Family

## 2024-01-26 ENCOUNTER — Ambulatory Visit (INDEPENDENT_AMBULATORY_CARE_PROVIDER_SITE_OTHER): Admitting: Family

## 2024-01-26 VITALS — BP 110/84 | HR 90 | Ht 64.0 in | Wt 274.2 lb

## 2024-01-26 DIAGNOSIS — E1165 Type 2 diabetes mellitus with hyperglycemia: Secondary | ICD-10-CM | POA: Diagnosis not present

## 2024-01-26 DIAGNOSIS — D500 Iron deficiency anemia secondary to blood loss (chronic): Secondary | ICD-10-CM

## 2024-01-26 DIAGNOSIS — N760 Acute vaginitis: Secondary | ICD-10-CM

## 2024-01-26 DIAGNOSIS — F319 Bipolar disorder, unspecified: Secondary | ICD-10-CM | POA: Diagnosis not present

## 2024-01-26 DIAGNOSIS — N926 Irregular menstruation, unspecified: Secondary | ICD-10-CM

## 2024-01-26 DIAGNOSIS — E782 Mixed hyperlipidemia: Secondary | ICD-10-CM

## 2024-01-26 DIAGNOSIS — E559 Vitamin D deficiency, unspecified: Secondary | ICD-10-CM | POA: Diagnosis not present

## 2024-01-26 DIAGNOSIS — R5383 Other fatigue: Secondary | ICD-10-CM | POA: Diagnosis not present

## 2024-01-26 DIAGNOSIS — E538 Deficiency of other specified B group vitamins: Secondary | ICD-10-CM

## 2024-01-26 DIAGNOSIS — Z202 Contact with and (suspected) exposure to infections with a predominantly sexual mode of transmission: Secondary | ICD-10-CM

## 2024-01-26 DIAGNOSIS — Z0130 Encounter for examination of blood pressure without abnormal findings: Secondary | ICD-10-CM

## 2024-01-26 NOTE — Assessment & Plan Note (Signed)
 Checking labs today.  Continue current therapy for lipid control. Will modify as needed based on labwork results.

## 2024-01-26 NOTE — Assessment & Plan Note (Signed)
 Checking labs today.  Will continue supplements as needed.

## 2024-01-26 NOTE — Assessment & Plan Note (Signed)
 Continue current meds.  Will adjust as needed based on results.  The patient is asked to make an attempt to improve diet and exercise patterns to aid in medical management of this problem. Addressed importance of increasing and maintaining water intake.

## 2024-01-26 NOTE — Progress Notes (Signed)
 Established Patient Office Visit  Subjective:  Patient ID: Julie Mcclain, female    DOB: March 26, 1996  Age: 28 y.o. MRN: [id number]  Chief Complaint  Patient presents with   Follow-up    1 month follow up    Patient is here today for her 1 month follow up.  She has been feeling fairly well since last appointment.   She does have additional concerns to discuss today.  Needs therapist - will send referral. Needs something else for weight loss. Asks if we can try the same thing her mom is on [facility]).  She also mentions a possible odor to her vaginal secretions, asks if we can redo the NuSwab.   Labs are due today. She needs refills.   I have reviewed her active problem list, medication list, allergies, health maintenance, notes from last encounter, lab results for her appointment today.      No other concerns at this time.   Past Medical History:  Diagnosis Date   Abnormal urinalysis 07/27/2022   Formatting of this note might be different from the original. Last Assessment & Plan: Formatting of this note might be different from the original. Patient got a dose of Rocephin  in the ED Will hold off on further antibiotics pending culture     Absolute anemia 07/27/2022   Formatting of this note might be different from the original. Last Assessment & Plan: Formatting of this note might be different from the original. 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 Suspect gastritis History of iron deficiency anemia History of menometrorrhagia Reports food-related epigastric pain consistent with gastritis Continue transfusion of 1 unit PRBCs started in the ED IV Protonix  GI consu   Generalized weakness 10/06/2021   History of trichomonal vaginitis 08/29/2023   IDA (iron deficiency anemia)    Menometrorrhagia    Obesity    Ovarian cyst    Snoring    Supervision of high risk pregnancy, antepartum 01/06/2022    Past Surgical History:  Procedure Laterality Date   denies      Social  History   Socioeconomic History   Marital status: Single    Spouse name: Not on file   Number of children: 0   Years of education: 12   Highest education level: Not on file  Occupational History   Occupation: unemployed  Tobacco Use   Smoking status: Never    Passive exposure: Never   Smokeless tobacco: Never  Vaping Use   Vaping status: Never Used  Substance and Sexual Activity   Alcohol use: No   Drug use: No   Sexual activity: Yes    Birth control/protection: None  Other Topics Concern   Not on file  Social History Narrative   Not on file   Social Drivers of Health   Financial Resource Strain: Low Risk  (01/06/2022)   Overall Financial Resource Strain (CARDIA)    Difficulty of Paying Living Expenses: Not hard at all  Food Insecurity: No Food Insecurity (06/22/2023)   Hunger Vital Sign    Worried About Running Out of Food in the Last Year: Never true    Ran Out of Food in the Last Year: Never true  Transportation Needs: No Transportation Needs (06/22/2023)   PRAPARE - Administrator, Civil Service (Medical): No    Lack of Transportation (Non-Medical): No  Physical Activity: Insufficiently Active (01/06/2022)   Exercise Vital Sign    Days of Exercise per Week: 1 day    Minutes of Exercise per  Session: 30 min  Stress: No Stress Concern Present (01/06/2022)   [employer] of Occupational Health - Occupational Stress Questionnaire    Feeling of Stress : Only a little  Social Connections: Moderately Isolated (01/06/2022)   Social Connection and Isolation Panel [NHANES]    Frequency of Communication with Friends and Family: More than three times a week    Frequency of Social Gatherings with Friends and Family: More than three times a week    Attends Religious Services: More than 4 times per year    Active Member of [employer] or Organizations: No    Attends Banker Meetings: Never    Marital Status: Never married  Intimate Partner Violence: Not At  Risk (06/22/2023)   Humiliation, Afraid, Rape, and Kick questionnaire    Fear of Current or Ex-Partner: No    Emotionally Abused: No    Physically Abused: No    Sexually Abused: No    Family History  Problem Relation Age of Onset   Diabetes Father        blind in one eye    No Known Allergies  Review of Systems  Psychiatric/Behavioral:  The patient is nervous/anxious.   All other systems reviewed and are negative.      Objective:   BP 110/84   Pulse 90   Ht 5\' 4"  (1.626 m)   Wt 274 lb 3.2 oz (124.4 kg)   SpO2 96%   BMI 47.07 kg/m   Vitals:   01/26/24 1009  BP: 110/84  Pulse: 90  Height: 5\' 4"  (1.626 m)  Weight: 274 lb 3.2 oz (124.4 kg)  SpO2: 96%  BMI (Calculated): 47.04    Physical Exam Vitals and nursing note reviewed.  Constitutional:      Appearance: Normal appearance. She is normal weight.  HENT:     Head: Normocephalic.  Eyes:     Extraocular Movements: Extraocular movements intact.     Conjunctiva/sclera: Conjunctivae normal.     Pupils: Pupils are equal, round, and reactive to light.  Cardiovascular:     Rate and Rhythm: Normal rate.  Pulmonary:     Effort: Pulmonary effort is normal.  Neurological:     General: No focal deficit present.     Mental Status: She is alert and oriented to person, place, and time. Mental status is at baseline.  Psychiatric:        Mood and Affect: Mood normal.        Behavior: Behavior normal.        Thought Content: Thought content normal.        Judgment: Judgment normal.      No results found for any visits on 01/26/24.  No results found for this or any previous visit (from the past 2160 hours).     Assessment & Plan:   Problem List Items Addressed This Visit       Other   Iron deficiency anemia due to chronic blood loss   Checking labs today.  Will continue supplements as needed.        Relevant Orders   CMP14+EGFR   CBC with Diff   Bipolar 1 disorder (HCC)   Sending referral to psych for  patient.  Will defer to them for any further changes to treatment plan.       Relevant Orders   CMP14+EGFR   CBC with Diff   Hyperlipidemia - Primary   Checking labs today.  Continue current therapy for lipid control. Will modify as needed  based on labwork results.        Relevant Orders   Lipid panel   CMP14+EGFR   CBC with Diff   Obesity, morbid (HCC)   Continue current meds.  Will adjust as needed based on results.  The patient is asked to make an attempt to improve diet and exercise patterns to aid in medical management of this problem. Addressed importance of increasing and maintaining water intake.        Other Visit Diagnoses       B12 deficiency due to diet       Checking labs today.  Will continue supplements as needed.   Relevant Orders   CMP14+EGFR   Vitamin B12   CBC with Diff     Vitamin D  deficiency, unspecified       Checking labs today.  Will continue supplements as needed.   Relevant Orders   VITAMIN D  25 Hydroxy (Vit-D Deficiency, Fractures)   CMP14+EGFR   CBC with Diff     Other fatigue       Relevant Orders   CMP14+EGFR   TSH   CBC with Diff     Type 2 diabetes mellitus with hyperglycemia, without long-term current use of insulin (HCC)       Relevant Orders   CMP14+EGFR   Hemoglobin A1c   CBC with Diff     Syphilis contact, treated       Test ordered in office today. Will call with results.   Relevant Orders   RPR w/reflex to TrepSure   CMP14+EGFR   CBC with Diff     Irregular menses       Checking hormone levels today. Will call patient with results when available   Relevant Orders   FSH+Prog+E2+SHBG     Acute vaginitis       Sending swab for testing for BV and yeast infection. Will send meds as needed   Relevant Orders   NuSwab Vaginitis Plus (VG+)       Return in about 1 month (around 02/26/2024).   Total time spent: 30 minutes  Trenda Frisk, FNP  01/26/2024   This document may have been prepared by [facility]  Voice Recognition software and as such may include unintentional dictation errors.

## 2024-01-26 NOTE — Assessment & Plan Note (Signed)
 Sending referral to psych for patient.  Will defer to them for any further changes to treatment plan.

## 2024-01-28 LAB — RPR, QUANT: RPR, Quant: 1:4 {titer} — ABNORMAL HIGH

## 2024-01-28 LAB — HEMOGLOBIN A1C
Est. average glucose Bld gHb Est-mCnc: 108 mg/dL
Hgb A1c MFr Bld: 5.4 % (ref 4.8–5.6)

## 2024-01-28 LAB — NUSWAB VAGINITIS PLUS (VG+)
Atopobium vaginae: HIGH {score} — AB
BVAB 2: HIGH {score} — AB
Candida albicans, NAA: NEGATIVE
Candida glabrata, NAA: NEGATIVE
Chlamydia trachomatis, NAA: NEGATIVE
Megasphaera 1: HIGH {score} — AB
Neisseria gonorrhoeae, NAA: NEGATIVE
Trich vag by NAA: NEGATIVE

## 2024-01-28 LAB — CMP14+EGFR
ALT: 15 IU/L (ref 0–32)
AST: 16 IU/L (ref 0–40)
Albumin: 4.1 g/dL (ref 4.0–5.0)
Alkaline Phosphatase: 87 IU/L (ref 44–121)
BUN/Creatinine Ratio: 14 (ref 9–23)
BUN: 11 mg/dL (ref 6–20)
Bilirubin Total: 0.3 mg/dL (ref 0.0–1.2)
CO2: 25 mmol/L (ref 20–29)
Calcium: 9.3 mg/dL (ref 8.7–10.2)
Chloride: 101 mmol/L (ref 96–106)
Creatinine, Ser: 0.76 mg/dL (ref 0.57–1.00)
Globulin, Total: 2.9 g/dL (ref 1.5–4.5)
Glucose: 75 mg/dL (ref 70–99)
Potassium: 4.3 mmol/L (ref 3.5–5.2)
Sodium: 139 mmol/L (ref 134–144)
Total Protein: 7 g/dL (ref 6.0–8.5)
eGFR: 109 mL/min/{1.73_m2} (ref >59)

## 2024-01-28 LAB — LIPID PANEL
Chol/HDL Ratio: 3.4 ratio (ref 0.0–4.4)
Cholesterol, Total: 226 mg/dL — ABNORMAL HIGH (ref 100–199)
HDL: 67 mg/dL (ref >39)
LDL Chol Calc (NIH): 139 mg/dL — ABNORMAL HIGH (ref 0–99)
Triglycerides: 112 mg/dL (ref 0–149)
VLDL Cholesterol Cal: 20 mg/dL (ref 5–40)

## 2024-01-28 LAB — CBC WITH DIFFERENTIAL/PLATELET
Basophils Absolute: 0 10*3/uL (ref 0.0–0.2)
Basos: 0 %
EOS (ABSOLUTE): 0.1 10*3/uL (ref 0.0–0.4)
Eos: 1 %
Hematocrit: 45.6 % (ref 34.0–46.6)
Hemoglobin: 14.1 g/dL (ref 11.1–15.9)
Immature Grans (Abs): 0 10*3/uL (ref 0.0–0.1)
Immature Granulocytes: 0 %
Lymphocytes Absolute: 2.5 10*3/uL (ref 0.7–3.1)
Lymphs: 24 %
MCH: 30.4 pg (ref 26.6–33.0)
MCHC: 30.9 g/dL — ABNORMAL LOW (ref 31.5–35.7)
MCV: 98 fL — ABNORMAL HIGH (ref 79–97)
Monocytes Absolute: 0.6 10*3/uL (ref 0.1–0.9)
Monocytes: 6 %
Neutrophils Absolute: 6.9 10*3/uL (ref 1.4–7.0)
Neutrophils: 69 %
Platelets: 297 10*3/uL (ref 150–450)
RBC: 4.64 x10E6/uL (ref 3.77–5.28)
RDW: 14.1 % (ref 11.7–15.4)
WBC: 10.2 10*3/uL (ref 3.4–10.8)

## 2024-01-28 LAB — FSH+PROG+E2+SHBG
Estradiol: 111 pg/mL
FSH: 1.6 m[IU]/mL
Progesterone: 0.1 ng/mL
Sex Hormone Binding: 46.6 nmol/L (ref 24.6–122.0)

## 2024-01-28 LAB — VITAMIN D 25 HYDROXY (VIT D DEFICIENCY, FRACTURES): Vit D, 25-Hydroxy: 45.1 ng/mL (ref 30.0–100.0)

## 2024-01-28 LAB — RPR W/REFLEX TO TREPSURE: RPR: REACTIVE — AB

## 2024-01-28 LAB — VITAMIN B12: Vitamin B-12: 539 pg/mL (ref 232–1245)

## 2024-01-28 LAB — TSH: TSH: 2.09 u[IU]/mL (ref 0.450–4.500)

## 2024-02-02 ENCOUNTER — Ambulatory Visit: Payer: Self-pay

## 2024-02-02 ENCOUNTER — Other Ambulatory Visit: Payer: Self-pay

## 2024-02-02 MED ORDER — METRONIDAZOLE 500 MG PO TABS: 500.0000 mg | ORAL_TABLET | Freq: Two times a day (BID) | ORAL | 0 refills | Status: DC

## 2024-02-10 ENCOUNTER — Other Ambulatory Visit: Payer: Self-pay

## 2024-02-10 MED ORDER — BUSPIRONE HCL 10 MG PO TABS: 10.0000 mg | ORAL_TABLET | Freq: Two times a day (BID) | ORAL | 1 refills | Status: DC

## 2024-02-10 MED ORDER — ARIPIPRAZOLE 10 MG PO TABS: 10.0000 mg | ORAL_TABLET | Freq: Every day | ORAL | 1 refills | Status: DC

## 2024-02-10 MED ORDER — BUPROPION HCL ER (XL) 300 MG PO TB24: 300.0000 mg | ORAL_TABLET | Freq: Every day | ORAL | 1 refills | Status: DC

## 2024-02-12 ENCOUNTER — Emergency Department
Admission: EM | Admit: 2024-02-12 | Discharge: 2024-02-12 | Disposition: A | Discharge status: Home / Self Care | Attending: Emergency Medicine | Admitting: Emergency Medicine

## 2024-02-12 ENCOUNTER — Other Ambulatory Visit: Payer: Self-pay

## 2024-02-12 ENCOUNTER — Emergency Department

## 2024-02-12 DIAGNOSIS — N939 Abnormal uterine and vaginal bleeding, unspecified: Secondary | ICD-10-CM | POA: Insufficient documentation

## 2024-02-12 LAB — CBC WITH DIFFERENTIAL/PLATELET
Abs Immature Granulocytes: 0.02 10*3/uL (ref 0.00–0.07)
Basophils Absolute: 0 10*3/uL (ref 0.0–0.1)
Basophils Relative: 0 %
Eosinophils Absolute: 0.2 10*3/uL (ref 0.0–0.5)
Eosinophils Relative: 2 %
HCT: 40.3 % (ref 36.0–46.0)
Hemoglobin: 12.8 g/dL (ref 12.0–15.0)
Immature Granulocytes: 0 %
Lymphocytes Relative: 27 %
Lymphs Abs: 2.3 10*3/uL (ref 0.7–4.0)
MCH: 30.8 pg (ref 26.0–34.0)
MCHC: 31.8 g/dL (ref 30.0–36.0)
MCV: 96.9 fL (ref 80.0–100.0)
Monocytes Absolute: 0.5 10*3/uL (ref 0.1–1.0)
Monocytes Relative: 6 %
Neutro Abs: 5.6 10*3/uL (ref 1.7–7.7)
Neutrophils Relative %: 65 %
Platelets: 302 10*3/uL (ref 150–400)
RBC: 4.16 MIL/uL (ref 3.87–5.11)
RDW: 14.1 % (ref 11.5–15.5)
WBC: 8.6 10*3/uL (ref 4.0–10.5)
nRBC: 0 % (ref 0.0–0.2)

## 2024-02-12 LAB — BASIC METABOLIC PANEL WITH GFR
Anion gap: 7 (ref 5–15)
BUN: 12 mg/dL (ref 6–20)
CO2: 26 mmol/L (ref 22–32)
Calcium: 8.8 mg/dL — ABNORMAL LOW (ref 8.9–10.3)
Chloride: 106 mmol/L (ref 98–111)
Creatinine, Ser: 0.81 mg/dL (ref 0.44–1.00)
GFR, Estimated: >60 mL/min (ref >60)
Glucose, Bld: 96 mg/dL (ref 70–99)
Potassium: 3.7 mmol/L (ref 3.5–5.1)
Sodium: 139 mmol/L (ref 135–145)

## 2024-02-12 LAB — POC URINE PREG, ED: Preg Test, Ur: NEGATIVE

## 2024-02-12 MED ORDER — APRI 0.15-30 MG-MCG PO TABS: 1.0000 | ORAL_TABLET | Freq: Every day | ORAL | 2 refills | Status: DC

## 2024-02-12 MED ORDER — BUTALBITAL-APAP-CAFFEINE 50-325-40 MG PO TABS: 1.0000 | ORAL_TABLET | Freq: Four times a day (QID) | ORAL | 0 refills | Status: DC | PRN

## 2024-02-12 NOTE — ED Provider Notes (Signed)
 [facility] Provider Note    Event Date/Time   First MD Initiated Contact with Patient 02/12/24 1654     (approximate)   History   Vaginal Bleeding   HPI Julie Mcclain is a 28 y.o. female presenting today for vaginal bleeding.  Patient states over the past 4 weeks she has had continuous vaginal bleeding.  She states some days are not as severe as others.  She reports today she has changed 3 pads over the span of 9 hours.  She is also passing blood clots.  States having a prior history of vaginal bleeding which has required blood transfusions and for her to be on oral contraceptive pills.  She came here today to make sure her blood counts were low or any other acute pathology.  Denies being sexually active.  Denies any other recent vaginal discharge.  Recently saw her primary care provider and treated with Flagyl  for BV.  Otherwise denies abdominal pain, nausea, vomiting, dysuria.  Chart review: Patient has a history of iron deficiency anemia for which she has seen oncology in the past for iron transfusions.  Seen in the ED on 09/22/2023 for similar symptoms.  She left before completing imaging that day.     Physical Exam   Triage Vital Signs: ED Triage Vitals  Encounter Vitals Group     BP 02/12/24 1647 119/84     Systolic BP Percentile --      Diastolic BP Percentile --      Pulse Rate 02/12/24 1645 100     Resp 02/12/24 1645 17     Temp 02/12/24 1645 98.9 F (37.2 C)     Temp Source 02/12/24 1645 Oral     SpO2 02/12/24 1645 98 %     Weight 02/12/24 1645 276 lb (125.2 kg)     Height 02/12/24 1645 5\' 4"  (1.626 m)     Head Circumference --      Peak Flow --      Pain Score 02/12/24 1645 8     Pain Loc --      Pain Education --      Exclude from Growth Chart --     Most recent vital signs: Vitals:   02/12/24 1645 02/12/24 1647  BP:  119/84  Pulse: 100   Resp: 17   Temp: 98.9 F (37.2 C)   SpO2: 98%    I have reviewed the vital  signs. General:  Awake, alert, no acute distress. Head:  Normocephalic, Atraumatic. EENT:  PERRL, EOMI, Oral mucosa pink and moist, Neck is supple. Cardiovascular: Regular rate, 2+ distal pulses. Respiratory:  Normal respiratory effort, symmetrical expansion, no distress.   Abdomen: Soft, nontender, nondistended Extremities:  Moving all four extremities through full ROM without pain.   Neuro:  Alert and oriented.  Interacting appropriately.   Skin:  Warm, dry, no rash.   Psych: Appropriate affect.    ED Results / Procedures / Treatments   Labs (all labs ordered are listed, but only abnormal results are displayed) Labs Reviewed  BASIC METABOLIC PANEL WITH GFR - Abnormal; Notable for the following components:      Result Value   Calcium 8.8 (*)    All other components within normal limits  POC URINE PREG, ED - Normal  CBC WITH DIFFERENTIAL/PLATELET     EKG    RADIOLOGY Ultrasound pending at time of signout   PROCEDURES:  Critical Care performed: No  Procedures   MEDICATIONS ORDERED IN ED:  Medications - No data to display   IMPRESSION / MDM / ASSESSMENT AND PLAN / ED COURSE  I reviewed the triage vital signs and the nursing notes.                              Differential diagnosis includes, but is not limited to, iron deficiency anemia, abnormal uterine bleeding, fibroids, adenoma, lower suspicion for miscarriage  Patient's presentation is most consistent with acute complicated illness / injury requiring diagnostic workup.  Patient is a 28 year old female presenting today for vaginal bleeding with history of the same.  Vital signs are stable and physical exam largely unremarkable.  Will obtain laboratory workup to look for signs of anemia as well as transvaginal ultrasound as she has not had this in recent memory to find a source of her bleeding.  Laboratory workup shows normal hemoglobin level with normal MCV.  No indication of heavy bleeding or iron deficiency  anemia.  BMP normal.  Pregnancy test negative.  Patient initially did not want to wait for ultrasound but was amenable when they arrived during our conversation.  Upon return, she stated she was ready to leave and would follow-up on her MyChart.  Did emphasize that I could not guarantee that there was nothing more concerning or life-threatening at this time.  She stated she would check her MyChart and return if there was anything concerning.  I will discharge her with Fioricet for headache as well as restarting her oral contraceptive pills as those have been effective in the past with helping vaginal bleeding.  She was told to follow-up with her gynecology team and given strict return precautions.  Clinical Course as of 02/12/24 1846  Sun Feb 12, 2024  1819 Patient approached me saying she did not want to wait for the ultrasound anymore as her hemoglobin was normal.  She wanted to follow-up with her gynecology team.  I did discuss with her that I cannot rule out anything life-threatening without the ultrasound to which she understood and stated she would follow-up with her gynecology team outpatient. [DW]  1842 She then decided she was okay to get the ultrasound as the tech was there. Upon return, she said she was ready to go.  Did not want to wait for ultrasound read.  Understood risks.  Discharge at this time [DW]    Clinical Course User Index [DW] Kandee Orion, MD     FINAL CLINICAL IMPRESSION(S) / ED DIAGNOSES   Final diagnoses:  Vaginal bleeding     Rx / DC Orders   ED Discharge Orders          Ordered    butalbital-acetaminophen -caffeine (FIORICET) 50-325-40 MG tablet  Every 6 hours PRN        02/12/24 1821    desogestrel-ethinyl estradiol  (APRI ) 0.15-30 MG-MCG tablet  Daily        02/12/24 1823             Note:  This document was prepared using Dragon voice recognition software and may include unintentional dictation errors.   Kandee Orion, MD 02/12/24 [phone]

## 2024-02-12 NOTE — ED Triage Notes (Signed)
 Pt sts that she is having heavy vaginal bleeding for the last four weeks. Pt sts that she is on her menstrual cycle and it is not stopping. Pt sts that she is also anemic. Pt continuously is playing on her phone while in triage.

## 2024-02-12 NOTE — ED Notes (Signed)
 Pt states that she does not wish to stay for the US  results. Dr. Karlynn Oyster made aware and discharge was put in.

## 2024-02-12 NOTE — ED Notes (Signed)
Pt transferred to US at this time

## 2024-02-12 NOTE — Discharge Instructions (Addendum)
 Your hemoglobin was normal today.  Please follow-up with your OB/GYN team outpatient for ongoing management of your symptoms.  Please return for any severe worsening symptoms.

## 2024-02-20 ENCOUNTER — Inpatient Hospital Stay (HOSPITAL_BASED_OUTPATIENT_CLINIC_OR_DEPARTMENT_OTHER): Payer: 59 | Admitting: Oncology

## 2024-02-20 ENCOUNTER — Encounter: Payer: Self-pay | Admitting: Oncology

## 2024-02-20 ENCOUNTER — Inpatient Hospital Stay: Payer: 59 | Attending: Oncology

## 2024-02-20 DIAGNOSIS — D500 Iron deficiency anemia secondary to blood loss (chronic): Secondary | ICD-10-CM | POA: Diagnosis not present

## 2024-02-20 DIAGNOSIS — N920 Excessive and frequent menstruation with regular cycle: Secondary | ICD-10-CM | POA: Diagnosis present

## 2024-02-20 LAB — CBC WITH DIFFERENTIAL (CANCER CENTER ONLY)
Abs Immature Granulocytes: 0.03 10*3/uL (ref 0.00–0.07)
Basophils Absolute: 0 10*3/uL (ref 0.0–0.1)
Basophils Relative: 0 %
Eosinophils Absolute: 0.3 10*3/uL (ref 0.0–0.5)
Eosinophils Relative: 3 %
HCT: 42.1 % (ref 36.0–46.0)
Hemoglobin: 13.2 g/dL (ref 12.0–15.0)
Immature Granulocytes: 0 %
Lymphocytes Relative: 29 %
Lymphs Abs: 2.4 10*3/uL (ref 0.7–4.0)
MCH: 30.5 pg (ref 26.0–34.0)
MCHC: 31.4 g/dL (ref 30.0–36.0)
MCV: 97.2 fL (ref 80.0–100.0)
Monocytes Absolute: 0.3 10*3/uL (ref 0.1–1.0)
Monocytes Relative: 4 %
Neutro Abs: 5.2 10*3/uL (ref 1.7–7.7)
Neutrophils Relative %: 64 %
Platelet Count: 341 10*3/uL (ref 150–400)
RBC: 4.33 MIL/uL (ref 3.87–5.11)
RDW: 13.5 % (ref 11.5–15.5)
WBC Count: 8.2 10*3/uL (ref 4.0–10.5)
nRBC: 0 % (ref 0.0–0.2)

## 2024-02-20 LAB — IRON AND TIBC
Iron: 68 ug/dL (ref 28–170)
Saturation Ratios: 21 % (ref 10.4–31.8)
TIBC: 330 ug/dL (ref 250–450)
UIBC: 262 ug/dL

## 2024-02-20 LAB — FERRITIN: Ferritin: 16 ng/mL (ref 11–307)

## 2024-02-20 NOTE — Addendum Note (Signed)
 Addended by: Seretha Dance C on: 02/20/2024 04:15 PM   Modules accepted: Orders, Level of Service

## 2024-02-20 NOTE — Progress Notes (Addendum)
 Hematology/Oncology Consult note [facility]  [phone] [phone]  Patient Care Team: Trenda Frisk, FNP as PCP - General (Family Medicine) Gwyn Leos, MD as Consulting Physician (Hematology) Copland, Alicia B, PA-C as Consulting Physician (Obstetrics and Gynecology) Avonne Boettcher, MD as Consulting Physician (Oncology)   Name of the patient: Julie Mcclain  [id number]  [birth year]   Date of visit: 02/20/24  Diagnosis-  iron deficiency anemia  Chief complaint/ Reason for visit-routine follow-up of iron deficiency anemia  Heme/Onc history:  patient is a 28 year old female referred For iron deficiency.  Her labs from 06/13/2023 showed ferritin level of 3 with an iron saturation of 3% and elevated TIBC.  B12 levels were normal at 480.  CBC was not checked recently and TSH was normal at 2.1.  Patient reports ongoing fatigue as well as craving ice.  She has not been able to tolerate oral iron.  Denies any consistent use of NSAIDs.  Denies any dark melanotic stools or bright red blood in stools.  No family history of colon cancer.  She does report heavy menstrual cycles which can sometimes last for a week.  Last seen by GYN in December 2024  Interval history-menstrual cycles continue to be heavy.  She reports ongoing fatigue.  ECOG PS- 0 Pain scale- 0   Review of systems- Review of Systems  Constitutional:  Positive for malaise/fatigue. Negative for chills, fever and weight loss.  HENT:  Negative for congestion, ear discharge and nosebleeds.   Eyes:  Negative for blurred vision.  Respiratory:  Negative for cough, hemoptysis, sputum production, shortness of breath and wheezing.   Cardiovascular:  Negative for chest pain, palpitations, orthopnea and claudication.  Gastrointestinal:  Negative for abdominal pain, blood in stool, constipation, diarrhea, heartburn, melena, nausea and vomiting.  Genitourinary:  Negative for  dysuria, flank pain, frequency, hematuria and urgency.  Musculoskeletal:  Negative for back pain, joint pain and myalgias.  Skin:  Negative for rash.  Neurological:  Negative for dizziness, tingling, focal weakness, seizures, weakness and headaches.  Endo/Heme/Allergies:  Does not bruise/bleed easily.  Psychiatric/Behavioral:  Negative for depression and suicidal ideas. The patient does not have insomnia.       No Known Allergies   Past Medical History:  Diagnosis Date   Abnormal urinalysis 07/27/2022   Formatting of this note might be different from the original. Last Assessment & Plan: Formatting of this note might be different from the original. Patient got a dose of Rocephin  in the ED Will hold off on further antibiotics pending culture     Absolute anemia 07/27/2022   Formatting of this note might be different from the original. Last Assessment & Plan: Formatting of this note might be different from the original. 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 Suspect gastritis History of iron deficiency anemia History of menometrorrhagia Reports food-related epigastric pain consistent with gastritis Continue transfusion of 1 unit PRBCs started in the ED IV Protonix  GI consu   Generalized weakness 10/06/2021   History of trichomonal vaginitis 08/29/2023   IDA (iron deficiency anemia)    Menometrorrhagia    Obesity    Ovarian cyst    Snoring    Supervision of high risk pregnancy, antepartum 01/06/2022     Past Surgical History:  Procedure Laterality Date   denies      Social History   Socioeconomic History   Marital status: Single    Spouse name: Not on file   Number of children: 0  Years of education: 88   Highest education level: Not on file  Occupational History   Occupation: unemployed  Tobacco Use   Smoking status: Never    Passive exposure: Never   Smokeless tobacco: Never  Vaping Use   Vaping status: Never Used  Substance and Sexual Activity   Alcohol use: No   Drug  use: No   Sexual activity: Yes    Birth control/protection: None  Other Topics Concern   Not on file  Social History Narrative   Not on file   Social Drivers of Health   Financial Resource Strain: Low Risk  (01/06/2022)   Overall Financial Resource Strain (CARDIA)    Difficulty of Paying Living Expenses: Not hard at all  Food Insecurity: No Food Insecurity (06/22/2023)   Hunger Vital Sign    Worried About Running Out of Food in the Last Year: Never true    Ran Out of Food in the Last Year: Never true  Transportation Needs: No Transportation Needs (06/22/2023)   PRAPARE - Administrator, Civil Service (Medical): No    Lack of Transportation (Non-Medical): No  Physical Activity: Insufficiently Active (01/06/2022)   Exercise Vital Sign    Days of Exercise per Week: 1 day    Minutes of Exercise per Session: 30 min  Stress: No Stress Concern Present (01/06/2022)   [employer] of Occupational Health - Occupational Stress Questionnaire    Feeling of Stress : Only a little  Social Connections: Moderately Isolated (01/06/2022)   Social Connection and Isolation Panel [NHANES]    Frequency of Communication with Friends and Family: More than three times a week    Frequency of Social Gatherings with Friends and Family: More than three times a week    Attends Religious Services: More than 4 times per year    Active Member of [employer] or Organizations: No    Attends Banker Meetings: Never    Marital Status: Never married  Intimate Partner Violence: Not At Risk (06/22/2023)   Humiliation, Afraid, Rape, and Kick questionnaire    Fear of Current or Ex-Partner: No    Emotionally Abused: No    Physically Abused: No    Sexually Abused: No    Family History  Problem Relation Age of Onset   Diabetes Father        blind in one eye     Current Outpatient Medications:    ARIPiprazole  (ABILIFY ) 10 MG tablet, Take 1 tablet (10 mg total) by mouth at bedtime., Disp: 90  tablet, Rfl: 1   buPROPion  (WELLBUTRIN  XL) 300 MG 24 hr tablet, Take 1 tablet (300 mg total) by mouth daily., Disp: 90 tablet, Rfl: 1   busPIRone  (BUSPAR ) 10 MG tablet, Take 1 tablet (10 mg total) by mouth 2 (two) times daily., Disp: 180 tablet, Rfl: 1   butalbital -acetaminophen -caffeine  (FIORICET) 50-325-40 MG tablet, Take 1-2 tablets by mouth every 6 (six) hours as needed for headache., Disp: 20 tablet, Rfl: 0   desogestrel-ethinyl estradiol  (APRI ) 0.15-30 MG-MCG tablet, Take 1 tablet by mouth daily., Disp: 28 tablet, Rfl: 2   ferrous sulfate  (FEROSUL) 325 (65 FE) MG tablet, Take 1 tablet (325 mg total) by mouth daily with breakfast., Disp: 90 tablet, Rfl: 1   metroNIDAZOLE  (FLAGYL ) 500 MG tablet, Take 1 tablet (500 mg total) by mouth 2 (two) times daily., Disp: 20 tablet, Rfl: 0   OZEMPIC , 2 MG/DOSE, 8 MG/3ML SOPN, INJECT 2 MG UNDER THE SKIN ONE DAY A WEEK (Patient not  taking: Reported on 01/26/2024), Disp: 3 mL, Rfl: 0   Vitamin D , Ergocalciferol , (DRISDOL ) 1.25 MG (50000 UNIT) CAPS capsule, Take 1 capsule (50,000 Units total) by mouth every 7 (seven) days., Disp: 12 capsule, Rfl: 3  Physical exam:  Vitals:   02/20/24 1507  BP: 131/72  Pulse: 100  Resp: 18  Temp: 98.8 F (37.1 C)  TempSrc: Tympanic  SpO2: 98%  Weight: 273 lb 14.4 oz (124.2 kg)  Height: 5\' 4"  (1.626 m)   Physical Exam Cardiovascular:     Rate and Rhythm: Normal rate and regular rhythm.     Heart sounds: Normal heart sounds.  Pulmonary:     Effort: Pulmonary effort is normal.     Breath sounds: Normal breath sounds.  Skin:    General: Skin is warm and dry.  Neurological:     Mental Status: She is alert and oriented to person, place, and time.      I have personally reviewed labs listed below:    Latest Ref Rng & Units 02/12/2024    5:21 PM  CMP  Glucose 70 - 99 mg/dL 96   BUN 6 - 20 mg/dL 12   Creatinine 1.61 - 1.00 mg/dL 0.96   Sodium 045 - 409 mmol/L 139   Potassium 3.5 - 5.1 mmol/L 3.7   Chloride 98 -  111 mmol/L 106   CO2 22 - 32 mmol/L 26   Calcium 8.9 - 10.3 mg/dL 8.8       Latest Ref Rng & Units 02/20/2024    2:42 PM  CBC  WBC 4.0 - 10.5 K/uL 8.2   Hemoglobin 12.0 - 15.0 g/dL 81.1   Hematocrit 91.4 - 46.0 % 42.1   Platelets 150 - 400 K/uL 341    I have personally reviewed Radiology images listed below: No images are attached to the encounter.  US  PELVIS TRANSVAGINAL NON-OB (TV ONLY) Result Date: 02/12/2024 EXAM: PELVIC ULTRASOUND TECHNIQUE: Transvaginal pelvic duplex ultrasound using B-mode/gray scaled imaging without Doppler spectral analysis and color flow was obtained. COMPARISON: 10/06/2021 CLINICAL HISTORY: Vaginal bleeding FINDINGS: ULTRASOUND FINDINGS: UTERUS: Uterus measures 8.3 x 3.7 x 4.5 cm (calculated volume 73 ml). Uterus demonstrates normal myometrial echotexture. ENDOMETRIAL STRIPE: Endometrial measures 11 mm. Endometrial stripe is within normal limits. RIGHT OVARY: Right ovary measures 2.8 x 1.4 x 1.9 cm (calculated volume 3.8 ml). Right ovary is within normal limits. LEFT OVARY: Left ovary measures 3.8 x 2.9 x 3.4 cm (calculated volume 19.3 ml). 3.0 x 2.2 x 3.0 cm simple left corpus luteum, benign/physiologic. No follow-up is recommended. FREE FLUID: No free fluid. IMPRESSION: 1. No acute findings. 2. Simple left corpus luteum, benign/physiologic. No follow-up is recommended. Electronically signed by: Zadie Herter MD 02/12/2024 07:30 PM EDT RP Workstation: [id number]     Assessment and plan- Patient is a 27 y.o. female here for routine follow-up of iron deficiency anemia  Patient last received IV iron in February 2025 2 doses of Feraheme .  Presently she is not anemic with a hemoglobin of 13.2.  Ferritin levels are lowAt 16 and therefore we will offer her more IV iron at this time.  She has received Feraheme  in the past and has tolerated it well.  Discussed risks and benefits of IV iron including all but not limited to possible risk of infusion reaction.  Patient  understands and agrees to proceed as planned. have encouraged her to follow-up with GYN since her menorrhagia is a known cause of her iron deficiency anemia.  CBC ferritin and iron  studies in 3 and 6 months I will see her back in 6 months   Visit Diagnosis 1. Iron deficiency anemia due to chronic blood loss      Dr. Seretha Dance, MD, MPH [facility] [phone] 02/20/2024 3:49 PM

## 2024-02-27 ENCOUNTER — Ambulatory Visit: Admitting: Family

## 2024-02-28 ENCOUNTER — Ambulatory Visit

## 2024-02-28 DIAGNOSIS — Z30430 Encounter for insertion of intrauterine contraceptive device: Secondary | ICD-10-CM

## 2024-02-29 ENCOUNTER — Inpatient Hospital Stay

## 2024-03-02 ENCOUNTER — Ambulatory Visit: Admitting: Family

## 2024-03-06 ENCOUNTER — Ambulatory Visit

## 2024-03-07 ENCOUNTER — Inpatient Hospital Stay

## 2024-04-11 ENCOUNTER — Encounter: Payer: Self-pay | Admitting: Family

## 2024-04-11 ENCOUNTER — Ambulatory Visit (INDEPENDENT_AMBULATORY_CARE_PROVIDER_SITE_OTHER): Admitting: Family

## 2024-04-11 DIAGNOSIS — F339 Major depressive disorder, recurrent, unspecified: Secondary | ICD-10-CM

## 2024-04-11 DIAGNOSIS — F411 Generalized anxiety disorder: Secondary | ICD-10-CM | POA: Diagnosis not present

## 2024-04-11 DIAGNOSIS — F319 Bipolar disorder, unspecified: Secondary | ICD-10-CM

## 2024-04-11 DIAGNOSIS — E782 Mixed hyperlipidemia: Secondary | ICD-10-CM | POA: Diagnosis not present

## 2024-04-11 MED ORDER — ZEPBOUND 5 MG/0.5ML ~~LOC~~ SOAJ: 5.0000 mg | SUBCUTANEOUS | 1 refills | Status: DC

## 2024-04-11 NOTE — Progress Notes (Signed)
 Established Patient Office Visit  Subjective:  Patient ID: Julie Mcclain, female    DOB: 03/02/1996  Age: 28 y.o. MRN: [id number]  Chief Complaint  Patient presents with   Follow-up    Med refills    Patient is here today for her 4 months follow up.  She has been feeling well since last appointment.   She does not have additional concerns to discuss today.  Labs are not due today.  She needs refills.   I have reviewed her active problem list, medication list, allergies, notes from last encounter, lab results for her appointment today.      No other concerns at this time.   Past Medical History:  Diagnosis Date   Abnormal urinalysis 07/27/2022   Formatting of this note might be different from the original. Last Assessment & Plan: Formatting of this note might be different from the original. Patient got a dose of Rocephin  in the ED Will hold off on further antibiotics pending culture     Absolute anemia 07/27/2022   Formatting of this note might be different from the original. Last Assessment & Plan: Formatting of this note might be different from the original. 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 Suspect gastritis History of iron deficiency anemia History of menometrorrhagia Reports food-related epigastric pain consistent with gastritis Continue transfusion of 1 unit PRBCs started in the ED IV Protonix  GI consu   Generalized weakness 10/06/2021   History of trichomonal vaginitis 08/29/2023   IDA (iron deficiency anemia)    Menometrorrhagia    Obesity    Ovarian cyst    Snoring    Supervision of high risk pregnancy, antepartum 01/06/2022    Past Surgical History:  Procedure Laterality Date   denies      Social History   Socioeconomic History   Marital status: Single    Spouse name: Not on file   Number of children: 0   Years of education: 12   Highest education level: Not on file  Occupational History   Occupation: unemployed  Tobacco Use   Smoking  status: Never    Passive exposure: Never   Smokeless tobacco: Never  Vaping Use   Vaping status: Never Used  Substance and Sexual Activity   Alcohol use: No   Drug use: No   Sexual activity: Yes    Birth control/protection: None  Other Topics Concern   Not on file  Social History Narrative   Not on file   Social Drivers of Health   Financial Resource Strain: Low Risk  (01/06/2022)   Overall Financial Resource Strain (CARDIA)    Difficulty of Paying Living Expenses: Not hard at all  Food Insecurity: No Food Insecurity (06/22/2023)   Hunger Vital Sign    Worried About Running Out of Food in the Last Year: Never true    Ran Out of Food in the Last Year: Never true  Transportation Needs: No Transportation Needs (06/22/2023)   PRAPARE - Administrator, Civil Service (Medical): No    Lack of Transportation (Non-Medical): No  Physical Activity: Insufficiently Active (01/06/2022)   Exercise Vital Sign    Days of Exercise per Week: 1 day    Minutes of Exercise per Session: 30 min  Stress: No Stress Concern Present (01/06/2022)   [employer] of Occupational Health - Occupational Stress Questionnaire    Feeling of Stress : Only a little  Social Connections: Moderately Isolated (01/06/2022)   Social Connection and Isolation Panel  Frequency of Communication with Friends and Family: More than three times a week    Frequency of Social Gatherings with Friends and Family: More than three times a week    Attends Religious Services: More than 4 times per year    Active Member of [employer] or Organizations: No    Attends Banker Meetings: Never    Marital Status: Never married  Intimate Partner Violence: Not At Risk (06/22/2023)   Humiliation, Afraid, Rape, and Kick questionnaire    Fear of Current or Ex-Partner: No    Emotionally Abused: No    Physically Abused: No    Sexually Abused: No    Family History  Problem Relation Age of Onset   Diabetes Father         blind in one eye    No Known Allergies  Review of Systems  All other systems reviewed and are negative.      Objective:   BP 117/84   Pulse 84   Ht 5' 4 (1.626 m)   Wt 275 lb (124.7 kg)   SpO2 97%   BMI 47.20 kg/m   Vitals:   04/11/24 1404  BP: 117/84  Pulse: 84  Height: 5' 4 (1.626 m)  Weight: 275 lb (124.7 kg)  SpO2: 97%  BMI (Calculated): 47.18    Physical Exam Vitals and nursing note reviewed.  Constitutional:      Appearance: Normal appearance. She is normal weight.  HENT:     Head: Normocephalic.  Eyes:     Extraocular Movements: Extraocular movements intact.     Conjunctiva/sclera: Conjunctivae normal.     Pupils: Pupils are equal, round, and reactive to light.  Cardiovascular:     Rate and Rhythm: Normal rate.  Pulmonary:     Effort: Pulmonary effort is normal.  Neurological:     General: No focal deficit present.     Mental Status: She is alert and oriented to person, place, and time. Mental status is at baseline.  Psychiatric:        Mood and Affect: Mood normal.        Behavior: Behavior normal.        Thought Content: Thought content normal.        Judgment: Judgment normal.      No results found for any visits on 04/11/24.  Recent Results (from the past 2160 hours)  RPR w/reflex to TrepSure     Status: Abnormal   Collection Time: 01/26/24 11:00 AM  Result Value Ref Range   RPR Reactive (A) Non Reactive  Lipid panel     Status: Abnormal   Collection Time: 01/26/24 11:00 AM  Result Value Ref Range   Cholesterol, Total 226 (H) 100 - 199 mg/dL   Triglycerides 887 0 - 149 mg/dL   HDL 67 >60 mg/dL   VLDL Cholesterol Cal 20 5 - 40 mg/dL   LDL Chol Calc (NIH) 860 (H) 0 - 99 mg/dL   Chol/HDL Ratio 3.4 0.0 - 4.4 ratio    Comment:                                   T. Chol/HDL Ratio  Men  Women                               1/2 Avg.Risk  3.4    3.3                                   Avg.Risk  5.0     4.4                                2X Avg.Risk  9.6    7.1                                3X Avg.Risk 23.4   11.0   VITAMIN D  25 Hydroxy (Vit-D Deficiency, Fractures)     Status: None   Collection Time: 01/26/24 11:00 AM  Result Value Ref Range   Vit D, 25-Hydroxy 45.1 30.0 - 100.0 ng/mL    Comment: Vitamin D  deficiency has been defined by the Institute of Medicine and an Endocrine Society practice guideline as a level of serum 25-OH vitamin D  less than 20 ng/mL (1,2). The Endocrine Society went on to further define vitamin D  insufficiency as a level between 21 and 29 ng/mL (2). 1. IOM (Institute of Medicine). 2010. Dietary reference    intakes for calcium and D. [place]: The    [employer]. 2. Holick MF, [place], Bischoff-Ferrari HA, et al.    Evaluation, treatment, and prevention of vitamin D     deficiency: an Endocrine Society clinical practice    guideline. JCEM. 2011 Jul; 96(7):1911-30.   CMP14+EGFR     Status: None   Collection Time: 01/26/24 11:00 AM  Result Value Ref Range   Glucose 75 70 - 99 mg/dL   BUN 11 6 - 20 mg/dL   Creatinine, Ser 9.23 0.57 - 1.00 mg/dL   eGFR 890 >40 fO/fpw/8.26   BUN/Creatinine Ratio 14 9 - 23   Sodium 139 134 - 144 mmol/L   Potassium 4.3 3.5 - 5.2 mmol/L   Chloride 101 96 - 106 mmol/L   CO2 25 20 - 29 mmol/L   Calcium 9.3 8.7 - 10.2 mg/dL   Total Protein 7.0 6.0 - 8.5 g/dL   Albumin 4.1 4.0 - 5.0 g/dL   Globulin, Total 2.9 1.5 - 4.5 g/dL   Bilirubin Total 0.3 0.0 - 1.2 mg/dL   Alkaline Phosphatase 87 44 - 121 IU/L   AST 16 0 - 40 IU/L   ALT 15 0 - 32 IU/L  TSH     Status: None   Collection Time: 01/26/24 11:00 AM  Result Value Ref Range   TSH 2.090 0.450 - 4.500 uIU/mL  Hemoglobin A1c     Status: None   Collection Time: 01/26/24 11:00 AM  Result Value Ref Range   Hgb A1c MFr Bld 5.4 4.8 - 5.6 %    Comment:          Prediabetes: 5.7 - 6.4          Diabetes: >6.4          Glycemic control for adults with  diabetes: <7.0    Est. average glucose Bld gHb Est-mCnc 108 mg/dL  Vitamin B12     Status: None   Collection Time:  01/26/24 11:00 AM  Result Value Ref Range   Vitamin B-12 539 232 - 1,245 pg/mL  CBC with Diff     Status: Abnormal   Collection Time: 01/26/24 11:00 AM  Result Value Ref Range   WBC 10.2 3.4 - 10.8 x10E3/uL   RBC 4.64 3.77 - 5.28 x10E6/uL   Hemoglobin 14.1 11.1 - 15.9 g/dL   Hematocrit 54.3 65.9 - 46.6 %   MCV 98 (H) 79 - 97 fL   MCH 30.4 26.6 - 33.0 pg   MCHC 30.9 (L) 31.5 - 35.7 g/dL   RDW 85.8 88.2 - 84.5 %   Platelets 297 150 - 450 x10E3/uL   Neutrophils 69 Not Estab. %   Lymphs 24 Not Estab. %   Monocytes 6 Not Estab. %   Eos 1 Not Estab. %   Basos 0 Not Estab. %   Neutrophils Absolute 6.9 1.4 - 7.0 x10E3/uL   Lymphocytes Absolute 2.5 0.7 - 3.1 x10E3/uL   Monocytes Absolute 0.6 0.1 - 0.9 x10E3/uL   EOS (ABSOLUTE) 0.1 0.0 - 0.4 x10E3/uL   Basophils Absolute 0.0 0.0 - 0.2 x10E3/uL   Immature Granulocytes 0 Not Estab. %   Immature Grans (Abs) 0.0 0.0 - 0.1 x10E3/uL  FSH+Prog+E2+SHBG     Status: None   Collection Time: 01/26/24 11:00 AM  Result Value Ref Range   FSH 1.6 mIU/mL    Comment:                      Adult Female             Range                       Follicular phase      3.5 -  12.5                       Ovulation phase       4.7 -  21.5                       Luteal phase          1.7 -   7.7                       Postmenopausal       25.8 - 134.8    Progesterone 0.1 ng/mL    Comment:                      Follicular phase       0.1 -   0.9                      Luteal phase           1.8 -  23.9                      Ovulation phase        0.1 -  12.0                      Pregnant                         First trimester    11.0 -  44.3  Second trimester   25.4 -  83.3                         Third trimester    58.7 - 214.0                      Postmenopausal         0.0 -   0.1    Sex Hormone Binding 46.6 24.6 - 122.0  nmol/L   Estradiol  111.0 pg/mL    Comment:                      Adult Female             Range                       Follicular phase     12.5 - 166.0                       Ovulation phase      85.8 - 498.0                       Luteal phase         43.8 - 211.0                       Postmenopausal       <6.0 -  54.7                      Pregnancy                       1st trimester     215.0 - >4300.0 Roche ECLIA methodology   RPR, Quant     Status: Abnormal   Collection Time: 01/26/24 11:00 AM  Result Value Ref Range   RPR, Quant 1:4 (H) NonRea<1:1 titer    Comment: This test is intended ONLY for specimens that have tested positive (reactive) or equivocal for Treponema pallidum antibodies prior to submission for testing. For the full CDC-recommended syphilis screening and diagnosis algorithm, [facility] offers test code 012005 RPR, Rfx Qn RPR/Confirm TP or 917654 T pallidum Screening Cascade.   NuSwab Vaginitis Plus (VG+)     Status: Abnormal   Collection Time: 01/26/24 12:05 PM  Result Value Ref Range   Atopobium vaginae High - 2 (A) Score   BVAB 2 High - 2 (A) Score   Megasphaera 1 High - 2 (A) Score    Comment: Calculate total score by adding the 3 individual bacterial vaginosis (BV) marker scores together.  Total score is interpreted as follows: Total score 0-1: Indicates the absence of BV. Total score   2: Indeterminate for BV. Additional clinical                  data should be evaluated to establish a                  diagnosis. Total score 3-6: Indicates the presence of BV.    Candida albicans, NAA Negative Negative   Candida glabrata, NAA Negative Negative   Trich vag by NAA Negative Negative   Chlamydia trachomatis, NAA Negative Negative   Neisseria gonorrhoeae, NAA Negative Negative  CBC with Differential     Status: None   Collection Time: 02/12/24  5:21 PM  Result Value Ref Range   WBC 8.6 4.0 - 10.5 K/uL   RBC 4.16 3.87 - 5.11 MIL/uL   Hemoglobin 12.8 12.0  - 15.0 g/dL   HCT 59.6 63.9 - 53.9 %   MCV 96.9 80.0 - 100.0 fL   MCH 30.8 26.0 - 34.0 pg   MCHC 31.8 30.0 - 36.0 g/dL   RDW 85.8 88.4 - 84.4 %   Platelets 302 150 - 400 K/uL   nRBC 0.0 0.0 - 0.2 %   Neutrophils Relative % 65 %   Neutro Abs 5.6 1.7 - 7.7 K/uL   Lymphocytes Relative 27 %   Lymphs Abs 2.3 0.7 - 4.0 K/uL   Monocytes Relative 6 %   Monocytes Absolute 0.5 0.1 - 1.0 K/uL   Eosinophils Relative 2 %   Eosinophils Absolute 0.2 0.0 - 0.5 K/uL   Basophils Relative 0 %   Basophils Absolute 0.0 0.0 - 0.1 K/uL   Immature Granulocytes 0 %   Abs Immature Granulocytes 0.02 0.00 - 0.07 K/uL    Comment: Performed at [facility], [address], [place], [place] [address]  Basic metabolic panel     Status: Abnormal   Collection Time: 02/12/24  5:21 PM  Result Value Ref Range   Sodium 139 135 - 145 mmol/L   Potassium 3.7 3.5 - 5.1 mmol/L   Chloride 106 98 - 111 mmol/L   CO2 26 22 - 32 mmol/L   Glucose, Bld 96 70 - 99 mg/dL    Comment: Glucose reference range applies only to samples taken after fasting for at least 8 hours.   BUN 12 6 - 20 mg/dL   Creatinine, Ser 9.18 0.44 - 1.00 mg/dL   Calcium 8.8 (L) 8.9 - 10.3 mg/dL   GFR, Estimated >39 >39 mL/min    Comment: (NOTE) Calculated using the CKD-EPI Creatinine Equation (2021)    Anion gap 7 5 - 15    Comment: Performed at [facility], [address] [place], [place] [address]  POC Urine Pregnancy, ED     Status: Normal   Collection Time: 02/12/24  6:12 PM  Result Value Ref Range   Preg Test, Ur Negative Negative  CBC with Differential (Cancer Center Only)     Status: None   Collection Time: 02/20/24  2:42 PM  Result Value Ref Range   WBC Count 8.2 4.0 - 10.5 K/uL   RBC 4.33 3.87 - 5.11 MIL/uL   Hemoglobin 13.2 12.0 - 15.0 g/dL   HCT 57.8 63.9 - 53.9 %   MCV 97.2 80.0 - 100.0 fL   MCH 30.5 26.0 - 34.0 pg   MCHC 31.4 30.0 - 36.0 g/dL   RDW 86.4 88.4 - 84.4 %   Platelet Count 341 150 - 400 K/uL    nRBC 0.0 0.0 - 0.2 %   Neutrophils Relative % 64 %   Neutro Abs 5.2 1.7 - 7.7 K/uL   Lymphocytes Relative 29 %   Lymphs Abs 2.4 0.7 - 4.0 K/uL   Monocytes Relative 4 %   Monocytes Absolute 0.3 0.1 - 1.0 K/uL   Eosinophils Relative 3 %   Eosinophils Absolute 0.3 0.0 - 0.5 K/uL   Basophils Relative 0 %   Basophils Absolute 0.0 0.0 - 0.1 K/uL   Immature Granulocytes 0 %   Abs Immature Granulocytes 0.03 0.00 - 0.07 K/uL    Comment: Performed at [facility], [address], [place], [place] [address]  Ferritin  Status: None   Collection Time: 02/20/24  2:42 PM  Result Value Ref Range   Ferritin 16 11 - 307 ng/mL    Comment: Performed at [facility], [address] [place], [place] [address]  Iron and TIBC([facility])     Status: None   Collection Time: 02/20/24  2:42 PM  Result Value Ref Range   Iron 68 28 - 170 ug/dL   TIBC 669 749 - 549 ug/dL   Saturation Ratios 21 10.4 - 31.8 %   UIBC 262 ug/dL    Comment: Performed at [facility], [address] [place], [place] [address]       Assessment & Plan Obesity, morbid (HCC) Continue current meds.  Will adjust as needed based on results.  The patient is asked to make an attempt to improve diet and exercise patterns to aid in medical management of this problem. Addressed importance of increasing and maintaining water intake.   Mixed hyperlipidemia Continue current therapy for lipid control. Will modify as needed based on labwork results.   Bipolar 1 disorder (HCC) Generalized anxiety disorder Depression, recurrent (HCC) Patient stable.  Well controlled with current therapy.   Continue current meds.      Return in about 1 month (around 05/12/2024) for F/U.   Total time spent: 20 minutes  ALAN CHRISTELLA ARRANT, FNP  04/11/2024   This document may have been prepared by [facility] Voice Recognition software and as such may include unintentional dictation errors.

## 2024-05-14 ENCOUNTER — Encounter: Payer: Self-pay | Admitting: Family

## 2024-05-14 NOTE — Assessment & Plan Note (Signed)
 Patient stable.  Well controlled with current therapy.   Continue current meds.

## 2024-05-14 NOTE — Assessment & Plan Note (Signed)
 Continue current meds.  Will adjust as needed based on results.  The patient is asked to make an attempt to improve diet and exercise patterns to aid in medical management of this problem. Addressed importance of increasing and maintaining water  intake.

## 2024-05-14 NOTE — Assessment & Plan Note (Signed)
 Continue current therapy for lipid control. Will modify as needed based on labwork results.

## 2024-05-22 ENCOUNTER — Inpatient Hospital Stay

## 2024-05-24 ENCOUNTER — Inpatient Hospital Stay: Attending: Oncology

## 2024-05-31 ENCOUNTER — Inpatient Hospital Stay

## 2024-06-01 ENCOUNTER — Emergency Department
Admission: EM | Admit: 2024-06-01 | Discharge: 2024-06-01 | Disposition: A | Discharge status: Home / Self Care | Attending: Emergency Medicine | Admitting: Emergency Medicine

## 2024-06-01 ENCOUNTER — Other Ambulatory Visit: Payer: Self-pay

## 2024-06-01 DIAGNOSIS — J029 Acute pharyngitis, unspecified: Secondary | ICD-10-CM | POA: Diagnosis not present

## 2024-06-01 DIAGNOSIS — B9689 Other specified bacterial agents as the cause of diseases classified elsewhere: Secondary | ICD-10-CM | POA: Insufficient documentation

## 2024-06-01 DIAGNOSIS — J028 Acute pharyngitis due to other specified organisms: Secondary | ICD-10-CM | POA: Insufficient documentation

## 2024-06-01 LAB — GROUP A STREP BY PCR: Group A Strep by PCR: NOT DETECTED

## 2024-06-01 MED ORDER — AMOXICILLIN 500 MG PO TABS: 500.0000 mg | ORAL_TABLET | Freq: Two times a day (BID) | ORAL | 0 refills | Status: DC

## 2024-06-01 NOTE — ED Provider Notes (Signed)
 [facility] Provider Note    Event Date/Time   First MD Initiated Contact with Patient 06/01/24 2135     (approximate)   History   Sore Throat    HPI  Julie Mcclain is a 28 y.o. female    with a past medical history of iron deficiency anemia, hyperlipidemia, syphilis, upper URI, bipolar 1 disorder, foreign body in the lip, who presents to the ED complaining of sore throat. According to the patient, symptoms started 3 days ago with sore throat, patient states she found on her left tonsil a white spot, use salty water to remove it.  Patient denies fever, chills, chest pain, chills, abdominal pain, urinary symptoms, diarrhea.     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   Gastritis 07/27/2022   Obesity, Class III, BMI 40-49.9 (morbid obesity) 07/27/2022   ADHD 07/14/2022   Bipolar 1 disorder (HCC) 07/14/2022   Anxiety 07/14/2022   Depression, recurrent (HCC) 07/14/2022   Generalized anxiety disorder 02/17/2022   Hyperlipidemia 02/17/2022   Prediabetes 02/17/2022   Iron deficiency anemia due to chronic blood loss 05/04/2021   Menometrorrhagia 05/04/2021     ROS: Patient currently denies any vision changes, tinnitus, difficulty speaking, facial droop, sore throat, chest pain, shortness of breath, abdominal pain, nausea/vomiting/diarrhea, dysuria, or weakness/numbness/paresthesias in any extremity   Physical Exam   Triage Vital Signs: ED Triage Vitals  Encounter Vitals Group     BP 06/01/24 2025 120/87     Girls Systolic BP Percentile --      Girls Diastolic BP Percentile --      Boys Systolic BP Percentile --      Boys Diastolic BP Percentile --      Pulse Rate 06/01/24 2025 96     Resp 06/01/24 2025 16     Temp 06/01/24 2025 98.1 F (36.7 C)     Temp Source 06/01/24 2025 Oral     SpO2 06/01/24 2025 100 %     Weight 06/01/24 2028  269 lb (122 kg)     Height 06/01/24 2028 5' 3 (1.6 m)     Head Circumference --      Peak Flow --      Pain Score 06/01/24 2028 7     Pain Loc --      Pain Education --      Exclude from Growth Chart --     Most recent vital signs: Vitals:   06/01/24 2025  BP: 120/87  Pulse: 96  Resp: 16  Temp: 98.1 F (36.7 C)  SpO2: 100%     Physical Exam Vitals and nursing note reviewed.   Constitutional:      General: Awake and alert. No acute distress.    Appearance: Normal appearance. The patient is normal weight.      Able to speak in complete sentences without .or dyspnea  HENT:     Head: Normocephalic and atraumatic.     Mouth: Mucous membranes are moist.  Left tonsillar enlargement, white exudate. Ears; right otoscopy: Presence of peritympanic erythema, no exudates, no otorrhea Left otoscopy: Within normal limits Eyes:     General: PERRL. Normal EOMs          Conjunctiva/sclera: Conjunctivae normal.  Nose No congestion/rhinorrhea  CV:                  Good peripheral perfusion.  Regular rate and rhythm  Resp:               Normal effort.  Equal breath sounds bilaterally.  No wheezing no crackles Abd:                 No distention.  Soft, nontender.  No rebound or guarding.  Musculoskeletal:        General: No swelling. Normal range of motion.  Skin:    General: Skin is warm and dry.     Capillary Refill: Capillary refill takes less than 2 seconds.     Findings: No rash.  Neurological:     Mental Status: The patient is awake and alert. MAE spontaneously. No gross focal neurologic deficits are appreciated.  Psychiatric Mood and affect are normal. Speech and behavior are normal.  ED Results / Procedures / Treatments   Labs (all labs ordered are listed, but only abnormal results are displayed) Labs Reviewed  GROUP A STREP BY PCR      PROCEDURES:  Critical Care performed:   Procedures   MEDICATIONS ORDERED IN ED: Medications - No data to display Clinical  Course as of 06/01/24 2210  Fri Jun 01, 2024  2159 Group A Strep by PCR if patient complains of sore throat. Negative [AE]    Clinical Course User Index [AE] Janit Kast, PA-C    IMPRESSION / MDM / ASSESSMENT AND PLAN / ED COURSE  I reviewed the triage vital signs and the nursing notes.  Differential diagnosis includes, but is not limited to, viral pharyngitis, strep A, unlikely peritonsillar abscess  Patient's presentation is most consistent with acute complicated illness / injury requiring diagnostic workup.    Julie Mcclain is a 28 y.o., female since today with history of 3 days of sore throat, odynophagia, patient report left tonsillar exudate, dry cough.  Patient denies fever, chills, respiratory symptoms.  On physical exam presence of right Perry tympanic erythema, no exudates, left tonsillar enlargement with white exudate.  Patient's diagnosis is consistent with pharyngitis. .Labs are  reassuring ruling out strep A. I did review the patient's allergies and medications.The patient is in stable and satisfactory condition for discharge home  Patient will be discharged home with prescriptions for amoxicillin .  Did advise the patient to take ibuprofen every 6 hours as needed for pain or fever. Patient is to follow up with PCP as needed or otherwise directed. Patient is given ED precautions to return to the ED for any worsening or new symptoms. Discussed plan of care with patient, answered all of patient's questions, Patient agreeable to plan of care. Advised patient to take medications according to the instructions on the label. Discussed possible side effects of new medications. Patient verbalized understanding.   FINAL CLINICAL IMPRESSION(S) / ED DIAGNOSES   Final diagnoses:  Pharyngitis due to other organism     Rx / DC Orders   ED Discharge Orders          Ordered    amoxicillin  (AMOXIL ) 500 MG tablet  2 times daily        06/01/24 2209              Note:  This document was prepared using Dragon voice recognition software and may include unintentional dictation errors.   Janit Kast, PA-C 06/01/24 2210    Floy Roberts, MD 06/01/24 2214

## 2024-06-01 NOTE — Discharge Instructions (Addendum)
 You have been diagnosed with pharyngitis.  Please take amoxicillin  1 tablet by mouth every 12 hours after main meals.  Please have soft diet, please drink plenty of fluids.  You can take ibuprofen every 6 hours for pain.  Come back to ED if you have new symptoms or symptoms worsen.

## 2024-06-01 NOTE — ED Triage Notes (Signed)
 Pt reports white spots to left side of tonsil. Reports pain when she tries to swallow, Denies nay other symptoms.

## 2024-06-05 ENCOUNTER — Other Ambulatory Visit: Payer: Self-pay

## 2024-06-05 ENCOUNTER — Emergency Department
Admission: EM | Admit: 2024-06-05 | Discharge: 2024-06-06 | Disposition: A | Discharge status: Home / Self Care | Attending: Emergency Medicine | Admitting: Emergency Medicine

## 2024-06-05 DIAGNOSIS — N939 Abnormal uterine and vaginal bleeding, unspecified: Secondary | ICD-10-CM | POA: Diagnosis present

## 2024-06-05 LAB — CBC WITH DIFFERENTIAL/PLATELET
Abs Immature Granulocytes: 0.04 K/uL (ref 0.00–0.07)
Basophils Absolute: 0 K/uL (ref 0.0–0.1)
Basophils Relative: 0 %
Eosinophils Absolute: 0.3 K/uL (ref 0.0–0.5)
Eosinophils Relative: 2 %
HCT: 41 % (ref 36.0–46.0)
Hemoglobin: 13.2 g/dL (ref 12.0–15.0)
Immature Granulocytes: 0 %
Lymphocytes Relative: 28 %
Lymphs Abs: 3.3 K/uL (ref 0.7–4.0)
MCH: 30.3 pg (ref 26.0–34.0)
MCHC: 32.2 g/dL (ref 30.0–36.0)
MCV: 94 fL (ref 80.0–100.0)
Monocytes Absolute: 0.8 K/uL (ref 0.1–1.0)
Monocytes Relative: 7 %
Neutro Abs: 7.4 K/uL (ref 1.7–7.7)
Neutrophils Relative %: 63 %
Platelets: 311 K/uL (ref 150–400)
RBC: 4.36 MIL/uL (ref 3.87–5.11)
RDW: 13.3 % (ref 11.5–15.5)
WBC: 12 K/uL — ABNORMAL HIGH (ref 4.0–10.5)
nRBC: 0 % (ref 0.0–0.2)

## 2024-06-05 LAB — BASIC METABOLIC PANEL WITH GFR
Anion gap: 10 (ref 5–15)
BUN: 10 mg/dL (ref 6–20)
CO2: 24 mmol/L (ref 22–32)
Calcium: 9 mg/dL (ref 8.9–10.3)
Chloride: 104 mmol/L (ref 98–111)
Creatinine, Ser: 0.8 mg/dL (ref 0.44–1.00)
GFR, Estimated: >60 mL/min (ref >60)
Glucose, Bld: 98 mg/dL (ref 70–99)
Potassium: 3.7 mmol/L (ref 3.5–5.1)
Sodium: 138 mmol/L (ref 135–145)

## 2024-06-05 NOTE — ED Provider Notes (Signed)
 [facility] Provider Note    Event Date/Time   First MD Initiated Contact with Patient 06/05/24 2325     (approximate)   History   Chief Complaint Vaginal Bleeding   HPI  Julie Mcclain is a 28 y.o. female with past medical history of bipolar disorder, iron deficiency anemia, and menometrorrhagia who presents to the ED complaining of vaginal bleeding.  Patient reports that she has had heavy vaginal bleeding consistently for about the past 2 weeks.  She reports having to change a pad 4-5 times per day, has been passing small clots.  She denies any associated abdominal or pelvic pain, has not had any difficulty urinating or changes in bowel movements.  She states that she was previously taking birth control to help with this, but has not been taking it recently.  She does not currently follow with an OB/GYN.     Physical Exam   Triage Vital Signs: ED Triage Vitals  Encounter Vitals Group     BP 06/05/24 2313 (!) 131/103     Girls Systolic BP Percentile --      Girls Diastolic BP Percentile --      Boys Systolic BP Percentile --      Boys Diastolic BP Percentile --      Pulse Rate 06/05/24 2313 100     Resp 06/05/24 2313 19     Temp 06/05/24 2313 98 F (36.7 C)     Temp src --      SpO2 06/05/24 2313 96 %     Weight 06/05/24 2312 270 lb (122.5 kg)     Height 06/05/24 2312 5' 3 (1.6 m)     Head Circumference --      Peak Flow --      Pain Score 06/05/24 2312 0     Pain Loc --      Pain Education --      Exclude from Growth Chart --     Most recent vital signs: Vitals:   06/05/24 2313  BP: (!) 131/103  Pulse: 100  Resp: 19  Temp: 98 F (36.7 C)  SpO2: 96%    Constitutional: Alert and oriented. Eyes: Conjunctivae are normal. Head: Atraumatic. Nose: No congestion/rhinnorhea. Mouth/Throat: Mucous membranes are moist.  Cardiovascular: Normal rate, regular rhythm. Grossly normal heart sounds.  2+ radial pulses  bilaterally. Respiratory: Normal respiratory effort.  No retractions. Lungs CTAB. Gastrointestinal: Soft and nontender. No distention. Musculoskeletal: No lower extremity tenderness nor edema.  Neurologic:  Normal speech and language. No gross focal neurologic deficits are appreciated.    ED Results / Procedures / Treatments   Labs (all labs ordered are listed, but only abnormal results are displayed) Labs Reviewed  CBC WITH DIFFERENTIAL/PLATELET - Abnormal; Notable for the following components:      Result Value   WBC 12.0 (*)    All other components within normal limits  BASIC METABOLIC PANEL WITH GFR  POC URINE PREG, ED    PROCEDURES:  Critical Care performed: No  Procedures   MEDICATIONS ORDERED IN ED: Medications - No data to display   IMPRESSION / MDM / ASSESSMENT AND PLAN / ED COURSE  I reviewed the triage vital signs and the nursing notes.                              28 y.o. female with past medical history of bipolar disorder, iron deficiency anemia, and mental  menorrhagia who presents to the ED complaining of vaginal bleeding for the past 2 weeks.  Patient's presentation is most consistent with acute presentation with potential threat to life or bodily function.  Differential diagnosis includes, but is not limited to, ectopic pregnancy, miscarriage, abnormal uterine bleeding, anemia.  Patient nontoxic-appearing and in no acute distress, vital signs are unremarkable.  Pregnancy testing is negative and she has a benign abdominal exam.  Labs are reassuring with no significant anemia, leukocytosis, electrolyte abnormality, or AKI.  With reassuring workup, patient appropriate for discharge home with OB/GYN follow-up.  She was provided with a referral and we will prescribe course of birth control to control bleeding.  She was counseled to return to the ED for new or worsening symptoms, patient agrees with plan.      FINAL CLINICAL IMPRESSION(S) / ED DIAGNOSES    Final diagnoses:  Vaginal bleeding     Rx / DC Orders   ED Discharge Orders          Ordered    medroxyPROGESTERone  (PROVERA ) 10 MG tablet  3 times daily,   Status:  Discontinued        06/06/24 0041    medroxyPROGESTERone  (PROVERA ) 10 MG tablet  Daily        06/06/24 0042             Note:  This document was prepared using Dragon voice recognition software and may include unintentional dictation errors.   Willo Dunnings, MD 06/06/24 [id number]

## 2024-06-05 NOTE — ED Triage Notes (Signed)
 Pt reports she missed her appointment last week for an iron transfusion, pt reports she has had heavy vaginal bleeding, pt states this amount is normal but she has been bleeding for 2 weeks. Pt denies abd cramping.

## 2024-06-06 ENCOUNTER — Encounter: Payer: Self-pay | Admitting: Family

## 2024-06-06 ENCOUNTER — Other Ambulatory Visit: Payer: Self-pay | Admitting: Family

## 2024-06-06 ENCOUNTER — Ambulatory Visit (INDEPENDENT_AMBULATORY_CARE_PROVIDER_SITE_OTHER): Admitting: Family

## 2024-06-06 VITALS — BP 117/78 | HR 111 | Ht 64.0 in | Wt 283.0 lb

## 2024-06-06 DIAGNOSIS — Z202 Contact with and (suspected) exposure to infections with a predominantly sexual mode of transmission: Secondary | ICD-10-CM | POA: Insufficient documentation

## 2024-06-06 DIAGNOSIS — N926 Irregular menstruation, unspecified: Secondary | ICD-10-CM

## 2024-06-06 DIAGNOSIS — Z0130 Encounter for examination of blood pressure without abnormal findings: Secondary | ICD-10-CM

## 2024-06-06 DIAGNOSIS — E66813 Obesity, class 3: Secondary | ICD-10-CM | POA: Diagnosis not present

## 2024-06-06 DIAGNOSIS — E538 Deficiency of other specified B group vitamins: Secondary | ICD-10-CM | POA: Insufficient documentation

## 2024-06-06 DIAGNOSIS — D500 Iron deficiency anemia secondary to blood loss (chronic): Secondary | ICD-10-CM

## 2024-06-06 DIAGNOSIS — E559 Vitamin D deficiency, unspecified: Secondary | ICD-10-CM | POA: Diagnosis not present

## 2024-06-06 DIAGNOSIS — E782 Mixed hyperlipidemia: Secondary | ICD-10-CM

## 2024-06-06 DIAGNOSIS — E1165 Type 2 diabetes mellitus with hyperglycemia: Secondary | ICD-10-CM | POA: Diagnosis not present

## 2024-06-06 DIAGNOSIS — N939 Abnormal uterine and vaginal bleeding, unspecified: Secondary | ICD-10-CM | POA: Diagnosis not present

## 2024-06-06 DIAGNOSIS — R5383 Other fatigue: Secondary | ICD-10-CM | POA: Diagnosis not present

## 2024-06-06 DIAGNOSIS — F319 Bipolar disorder, unspecified: Secondary | ICD-10-CM | POA: Diagnosis not present

## 2024-06-06 LAB — POC URINE PREG, ED: Preg Test, Ur: NEGATIVE

## 2024-06-06 MED ORDER — BUSPIRONE HCL 10 MG PO TABS: 10.0000 mg | ORAL_TABLET | Freq: Two times a day (BID) | ORAL | 1 refills | Status: AC

## 2024-06-06 MED ORDER — MEDROXYPROGESTERONE ACETATE 10 MG PO TABS: 20.0000 mg | ORAL_TABLET | Freq: Three times a day (TID) | ORAL | 0 refills | Status: DC

## 2024-06-06 MED ORDER — ARIPIPRAZOLE 10 MG PO TABS: 10.0000 mg | ORAL_TABLET | Freq: Every day | ORAL | 1 refills | Status: AC

## 2024-06-06 MED ORDER — MEDROXYPROGESTERONE ACETATE 10 MG PO TABS: 10.0000 mg | ORAL_TABLET | Freq: Every day | ORAL | 0 refills | Status: DC

## 2024-06-06 MED ORDER — ZEPBOUND 5 MG/0.5ML ~~LOC~~ SOAJ: 5.0000 mg | SUBCUTANEOUS | 1 refills | Status: DC

## 2024-06-06 MED ORDER — VITAMIN D (ERGOCALCIFEROL) 1.25 MG (50000 UNIT) PO CAPS: 50000.0000 [IU] | ORAL_CAPSULE | ORAL | 3 refills | Status: DC

## 2024-06-06 MED ORDER — NORETHIN ACE-ETH ESTRAD-FE 1-20 MG-MCG PO TABS: 1.0000 | ORAL_TABLET | Freq: Every day | ORAL | 1 refills | Status: DC

## 2024-06-06 MED ORDER — BUPROPION HCL ER (XL) 300 MG PO TB24: 300.0000 mg | ORAL_TABLET | Freq: Every day | ORAL | 1 refills | Status: AC

## 2024-06-06 NOTE — Assessment & Plan Note (Signed)
-   continue taking medications to as prescribed.

## 2024-06-06 NOTE — Assessment & Plan Note (Signed)
-   lab work to be collected. - vitamin supplementation will be recommended based off lab results. - stop taking provera  10 mg once daily. - start taking Loestrin 1-20 mg- mcg daily.

## 2024-06-06 NOTE — Progress Notes (Unsigned)
 Established Patient Office Visit  Subjective:  Patient ID: Julie Mcclain, female    DOB: 30-Apr-1996  Age: 28 y.o. MRN: [id number]  Chief Complaint  Patient presents with   Follow-up    Pt has had a 2 week period. Pt is wanting to loose weight. Pt is wanting to discuss iron infusions.     Patient is here today for a PRN follow up.  She has been feeling fairly well since last appointment.   She does have additional concerns to discuss today. She wonders if she needs an iron transfusion after having an extended menstrual cycle that lasted 2 weeks. She has seen her OB/Gyn about this issue and they started her on birth control.  Labs were done recently by another provider. Will do blood work today and add on iron studies. If she needs a transfusion will send referral to hematology for iron transfusion as needed. She has a history of iron deficiency anemia that needed iron transfusions. Will also collect urine for micro alb/creat ratio. She also wonders if she has sleep apnea because she is a loud snorer at night, reports feeling tired after sleeping at least 8 hours, and reports her partner wakes her up and says she has stopped breathing. She scored a 15 out of 24 on the Epworth sleepiness scale. Will order sleep study to rule out OSA. She needs refills today.  I have reviewed her active problem list, medication list, allergies, family history, social history, health maintenance, notes from last encounter, lab results for her appointment today. Urine micro alb/creat ratio recommended to patient to be collected today; patient stated she was unable to leave a urine sample at this time.    No other concerns at this time.   Past Medical History:  Diagnosis Date   Abnormal urinalysis 07/27/2022   Formatting of this note might be different from the original. Last Assessment & Plan: Formatting of this note might be different from the original. Patient got a dose of Rocephin  in the ED Will  hold off on further antibiotics pending culture     Absolute anemia 07/27/2022   Formatting of this note might be different from the original. Last Assessment & Plan: Formatting of this note might be different from the original. 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 Suspect gastritis History of iron deficiency anemia History of menometrorrhagia Reports food-related epigastric pain consistent with gastritis Continue transfusion of 1 unit PRBCs started in the ED IV Protonix  GI consu   Generalized weakness 10/06/2021   History of trichomonal vaginitis 08/29/2023   IDA (iron deficiency anemia)    Menometrorrhagia    Obesity    Ovarian cyst    Snoring    Supervision of high risk pregnancy, antepartum 01/06/2022    Past Surgical History:  Procedure Laterality Date   denies      Social History   Socioeconomic History   Marital status: Single    Spouse name: Not on file   Number of children: 0   Years of education: 12   Highest education level: Not on file  Occupational History   Occupation: unemployed  Tobacco Use   Smoking status: Never    Passive exposure: Never   Smokeless tobacco: Never  Vaping Use   Vaping status: Never Used  Substance and Sexual Activity   Alcohol use: No   Drug use: No   Sexual activity: Yes    Birth control/protection: None  Other Topics Concern   Not on file  Social History  Narrative   Not on file   Social Drivers of Health   Financial Resource Strain: Low Risk  (01/06/2022)   Overall Financial Resource Strain (CARDIA)    Difficulty of Paying Living Expenses: Not hard at all  Food Insecurity: No Food Insecurity (06/22/2023)   Hunger Vital Sign    Worried About Running Out of Food in the Last Year: Never true    Ran Out of Food in the Last Year: Never true  Transportation Needs: No Transportation Needs (06/22/2023)   PRAPARE - Administrator, Civil Service (Medical): No    Lack of Transportation (Non-Medical): No  Physical Activity:  Insufficiently Active (01/06/2022)   Exercise Vital Sign    Days of Exercise per Week: 1 day    Minutes of Exercise per Session: 30 min  Stress: No Stress Concern Present (01/06/2022)   [employer] of Occupational Health - Occupational Stress Questionnaire    Feeling of Stress : Only a little  Social Connections: Moderately Isolated (01/06/2022)   Social Connection and Isolation Panel    Frequency of Communication with Friends and Family: More than three times a week    Frequency of Social Gatherings with Friends and Family: More than three times a week    Attends Religious Services: More than 4 times per year    Active Member of [employer] or Organizations: No    Attends Banker Meetings: Never    Marital Status: Never married  Intimate Partner Violence: Not At Risk (06/22/2023)   Humiliation, Afraid, Rape, and Kick questionnaire    Fear of Current or Ex-Partner: No    Emotionally Abused: No    Physically Abused: No    Sexually Abused: No    Family History  Problem Relation Age of Onset   Diabetes Father        blind in one eye    No Known Allergies  Review of Systems  Constitutional:  Positive for malaise/fatigue.       Weight gain  HENT: Negative.    Eyes:  Negative for blurred vision and pain.  Respiratory:  Negative for cough and shortness of breath.   Cardiovascular:  Negative for chest pain, palpitations, claudication and leg swelling.  Gastrointestinal:  Negative for abdominal pain, blood in stool, constipation, diarrhea, nausea and vomiting.  Genitourinary:  Negative for dysuria, frequency and urgency.  Musculoskeletal: Negative.   Skin: Negative.   Neurological:  Negative for dizziness, tingling, sensory change and headaches.  Endo/Heme/Allergies:  Bruises/bleeds easily (2 week long menstrual cycle).  Psychiatric/Behavioral: Negative.         Objective:   BP 117/78   Pulse (!) 111   Ht 5' 4 (1.626 m)   Wt 283 lb (128.4 kg)   LMP 05/27/2024  (Approximate)   SpO2 97%   BMI 48.58 kg/m   Vitals:   06/06/24 1411  BP: 117/78  Pulse: (!) 111  Height: 5' 4 (1.626 m)  Weight: 283 lb (128.4 kg)  SpO2: 97%  BMI (Calculated): 48.55    Physical Exam Vitals and nursing note reviewed.  Constitutional:      Appearance: Normal appearance.  HENT:     Head: Normocephalic.  Eyes:     Extraocular Movements: Extraocular movements intact.     Pupils: Pupils are equal, round, and reactive to light.  Cardiovascular:     Rate and Rhythm: Normal rate and regular rhythm.     Pulses: Normal pulses.     Heart sounds: Normal heart sounds.  No murmur heard. Pulmonary:     Effort: Pulmonary effort is normal. No respiratory distress.     Breath sounds: Normal breath sounds.  Abdominal:     General: There is no distension.     Tenderness: There is no abdominal tenderness.  Musculoskeletal:        General: No tenderness. Normal range of motion.     Cervical back: Normal range of motion and neck supple.     Right lower leg: No edema.     Left lower leg: No edema.  Skin:    General: Skin is warm and dry.     Coloration: Skin is not jaundiced.     Findings: No erythema.  Neurological:     General: No focal deficit present.     Mental Status: She is alert and oriented to person, place, and time.  Psychiatric:        Mood and Affect: Mood normal.      Results for orders placed or performed during the hospital encounter of 06/05/24  CBC with Differential  Result Value Ref Range   WBC 12.0 (H) 4.0 - 10.5 K/uL   RBC 4.36 3.87 - 5.11 MIL/uL   Hemoglobin 13.2 12.0 - 15.0 g/dL   HCT 58.9 63.9 - 53.9 %   MCV 94.0 80.0 - 100.0 fL   MCH 30.3 26.0 - 34.0 pg   MCHC 32.2 30.0 - 36.0 g/dL   RDW 86.6 88.4 - 84.4 %   Platelets 311 150 - 400 K/uL   nRBC 0.0 0.0 - 0.2 %   Neutrophils Relative % 63 %   Neutro Abs 7.4 1.7 - 7.7 K/uL   Lymphocytes Relative 28 %   Lymphs Abs 3.3 0.7 - 4.0 K/uL   Monocytes Relative 7 %   Monocytes Absolute 0.8  0.1 - 1.0 K/uL   Eosinophils Relative 2 %   Eosinophils Absolute 0.3 0.0 - 0.5 K/uL   Basophils Relative 0 %   Basophils Absolute 0.0 0.0 - 0.1 K/uL   Immature Granulocytes 0 %   Abs Immature Granulocytes 0.04 0.00 - 0.07 K/uL  Basic metabolic panel  Result Value Ref Range   Sodium 138 135 - 145 mmol/L   Potassium 3.7 3.5 - 5.1 mmol/L   Chloride 104 98 - 111 mmol/L   CO2 24 22 - 32 mmol/L   Glucose, Bld 98 70 - 99 mg/dL   BUN 10 6 - 20 mg/dL   Creatinine, Ser 9.19 0.44 - 1.00 mg/dL   Calcium 9.0 8.9 - 89.6 mg/dL   GFR, Estimated >39 >39 mL/min   Anion gap 10 5 - 15  POC Urine Pregnancy, ED  Result Value Ref Range   Preg Test, Ur NEGATIVE NEGATIVE    Recent Results (from the past 2160 hours)  Group A Strep by PCR if patient complains of sore throat.     Status: None   Collection Time: 06/01/24  8:26 PM   Specimen: Throat; Sterile Swab  Result Value Ref Range   Group A Strep by PCR NOT DETECTED NOT DETECTED    Comment: Performed at [facility], [address] [place], [place] [address]  CBC with Differential     Status: Abnormal   Collection Time: 06/05/24 11:14 PM  Result Value Ref Range   WBC 12.0 (H) 4.0 - 10.5 K/uL   RBC 4.36 3.87 - 5.11 MIL/uL   Hemoglobin 13.2 12.0 - 15.0 g/dL   HCT 58.9 63.9 - 53.9 %   MCV  94.0 80.0 - 100.0 fL   MCH 30.3 26.0 - 34.0 pg   MCHC 32.2 30.0 - 36.0 g/dL   RDW 86.6 88.4 - 84.4 %   Platelets 311 150 - 400 K/uL   nRBC 0.0 0.0 - 0.2 %   Neutrophils Relative % 63 %   Neutro Abs 7.4 1.7 - 7.7 K/uL   Lymphocytes Relative 28 %   Lymphs Abs 3.3 0.7 - 4.0 K/uL   Monocytes Relative 7 %   Monocytes Absolute 0.8 0.1 - 1.0 K/uL   Eosinophils Relative 2 %   Eosinophils Absolute 0.3 0.0 - 0.5 K/uL   Basophils Relative 0 %   Basophils Absolute 0.0 0.0 - 0.1 K/uL   Immature Granulocytes 0 %   Abs Immature Granulocytes 0.04 0.00 - 0.07 K/uL    Comment: Performed at [facility], [address] [place], [place] [address]   Basic metabolic panel     Status: None   Collection Time: 06/05/24 11:14 PM  Result Value Ref Range   Sodium 138 135 - 145 mmol/L   Potassium 3.7 3.5 - 5.1 mmol/L   Chloride 104 98 - 111 mmol/L   CO2 24 22 - 32 mmol/L   Glucose, Bld 98 70 - 99 mg/dL    Comment: Glucose reference range applies only to samples taken after fasting for at least 8 hours.   BUN 10 6 - 20 mg/dL   Creatinine, Ser 9.19 0.44 - 1.00 mg/dL   Calcium 9.0 8.9 - 89.6 mg/dL   GFR, Estimated >39 >39 mL/min    Comment: (NOTE) Calculated using the CKD-EPI Creatinine Equation (2021)    Anion gap 10 5 - 15    Comment: Performed at [facility], [address] [place], [place] [address]  POC Urine Pregnancy, ED     Status: None   Collection Time: 06/06/24 12:13 AM  Result Value Ref Range   Preg Test, Ur NEGATIVE NEGATIVE    Comment:        THE SENSITIVITY OF THIS METHODOLOGY IS >20 mIU/mL.        Assessment & Plan:   Assessment & Plan Type 2 diabetes mellitus with hyperglycemia, without long-term current use of insulin (HCC) Mixed hyperlipidemia Obesity, Class III, BMI 40-49.9 (morbid obesity) - discussed healthy diet and exercise at leat 150 minutes per week. - Refill sent for Zepbound . - continue taking medications as prescribed. - lab work to be collected. Bipolar 1 disorder (HCC) - continue taking medications to as prescribed. Vitamin D  deficiency, unspecified B12 deficiency due to diet Other fatigue Iron deficiency anemia due to chronic blood loss Irregular menses - lab work to be collected. - vitamin supplementation will be recommended based off lab results. - stop taking provera  10 mg once daily. - start taking Loestrin 1-20 mg- mcg daily.    No follow-ups on file.   Total time spent: 35 minutes  Oddis DELENA Cain, FNP  06/06/2024   This document may have been prepared by [facility] Voice Recognition software and as such may include unintentional dictation errors.

## 2024-06-06 NOTE — Assessment & Plan Note (Signed)
-   discussed healthy diet and exercise at leat 150 minutes per week. - Refill sent for Zepbound . - continue taking medications as prescribed. - lab work to be collected.

## 2024-06-07 ENCOUNTER — Encounter: Payer: Self-pay | Admitting: Family

## 2024-06-07 LAB — CBC WITH DIFFERENTIAL/PLATELET
Basophils Absolute: 0 x10E3/uL (ref 0.0–0.2)
Basos: 0 %
EOS (ABSOLUTE): 0.3 x10E3/uL (ref 0.0–0.4)
Eos: 2 %
Hematocrit: 44.1 % (ref 34.0–46.6)
Hemoglobin: 13.8 g/dL (ref 11.1–15.9)
Immature Grans (Abs): 0 x10E3/uL (ref 0.0–0.1)
Immature Granulocytes: 0 %
Lymphocytes Absolute: 2.6 x10E3/uL (ref 0.7–3.1)
Lymphs: 25 %
MCH: 30 pg (ref 26.6–33.0)
MCHC: 31.3 g/dL — ABNORMAL LOW (ref 31.5–35.7)
MCV: 96 fL (ref 79–97)
Monocytes Absolute: 0.8 x10E3/uL (ref 0.1–0.9)
Monocytes: 8 %
Neutrophils Absolute: 6.7 x10E3/uL (ref 1.4–7.0)
Neutrophils: 65 %
Platelets: 341 x10E3/uL (ref 150–450)
RBC: 4.6 x10E6/uL (ref 3.77–5.28)
RDW: 12.9 % (ref 11.7–15.4)
WBC: 10.4 x10E3/uL (ref 3.4–10.8)

## 2024-06-07 LAB — CMP14+EGFR
ALT: 12 IU/L (ref 0–32)
AST: 15 IU/L (ref 0–40)
Albumin: 4.2 g/dL (ref 4.0–5.0)
Alkaline Phosphatase: 92 IU/L (ref 41–116)
BUN/Creatinine Ratio: 10 (ref 9–23)
BUN: 8 mg/dL (ref 6–20)
Bilirubin Total: 0.4 mg/dL (ref 0.0–1.2)
CO2: 25 mmol/L (ref 20–29)
Calcium: 9.6 mg/dL (ref 8.7–10.2)
Chloride: 103 mmol/L (ref 96–106)
Creatinine, Ser: 0.84 mg/dL (ref 0.57–1.00)
Globulin, Total: 3 g/dL (ref 1.5–4.5)
Glucose: 80 mg/dL (ref 70–99)
Potassium: 3.8 mmol/L (ref 3.5–5.2)
Sodium: 141 mmol/L (ref 134–144)
Total Protein: 7.2 g/dL (ref 6.0–8.5)
eGFR: 96 mL/min/1.73 (ref >59)

## 2024-06-07 LAB — VITAMIN B12: Vitamin B-12: 542 pg/mL (ref 232–1245)

## 2024-06-07 LAB — LIPID PANEL
Chol/HDL Ratio: 3.5 ratio (ref 0.0–4.4)
Cholesterol, Total: 234 mg/dL — ABNORMAL HIGH (ref 100–199)
HDL: 66 mg/dL (ref >39)
LDL Chol Calc (NIH): 122 mg/dL — ABNORMAL HIGH (ref 0–99)
Triglycerides: 265 mg/dL — ABNORMAL HIGH (ref 0–149)
VLDL Cholesterol Cal: 46 mg/dL — ABNORMAL HIGH (ref 5–40)

## 2024-06-07 LAB — IRON,TIBC AND FERRITIN PANEL
Ferritin: 24 ng/mL (ref 15–150)
Iron Saturation: 16 % (ref 15–55)
Iron: 59 ug/dL (ref 27–159)
Total Iron Binding Capacity: 362 ug/dL (ref 250–450)
UIBC: 303 ug/dL (ref 131–425)

## 2024-06-07 LAB — TSH: TSH: 1.38 u[IU]/mL (ref 0.450–4.500)

## 2024-06-07 LAB — HEMOGLOBIN A1C
Est. average glucose Bld gHb Est-mCnc: 111 mg/dL
Hgb A1c MFr Bld: 5.5 % (ref 4.8–5.6)

## 2024-06-07 LAB — FSH+PROG+E2+SHBG
Estradiol: 41.9 pg/mL
FSH: 5.5 m[IU]/mL
Progesterone: 0.2 ng/mL
Sex Hormone Binding: 34 nmol/L (ref 24.6–122.0)

## 2024-06-07 LAB — VITAMIN D 25 HYDROXY (VIT D DEFICIENCY, FRACTURES): Vit D, 25-Hydroxy: 21.4 ng/mL — ABNORMAL LOW (ref 30.0–100.0)

## 2024-06-12 ENCOUNTER — Emergency Department: Admission: EM | Admit: 2024-06-12 | Discharge: 2024-06-12 | Disposition: A | Discharge status: Home / Self Care

## 2024-06-12 ENCOUNTER — Other Ambulatory Visit: Payer: Self-pay

## 2024-06-12 ENCOUNTER — Encounter: Payer: Self-pay | Admitting: Emergency Medicine

## 2024-06-12 DIAGNOSIS — E119 Type 2 diabetes mellitus without complications: Secondary | ICD-10-CM | POA: Insufficient documentation

## 2024-06-12 DIAGNOSIS — N939 Abnormal uterine and vaginal bleeding, unspecified: Secondary | ICD-10-CM | POA: Insufficient documentation

## 2024-06-12 LAB — CBC WITH DIFFERENTIAL/PLATELET
Abs Immature Granulocytes: 0.03 K/uL (ref 0.00–0.07)
Basophils Absolute: 0 K/uL (ref 0.0–0.1)
Basophils Relative: 0 %
Eosinophils Absolute: 0.2 K/uL (ref 0.0–0.5)
Eosinophils Relative: 2 %
HCT: 39 % (ref 36.0–46.0)
Hemoglobin: 12.8 g/dL (ref 12.0–15.0)
Immature Granulocytes: 0 %
Lymphocytes Relative: 25 %
Lymphs Abs: 1.9 K/uL (ref 0.7–4.0)
MCH: 30.5 pg (ref 26.0–34.0)
MCHC: 32.8 g/dL (ref 30.0–36.0)
MCV: 93.1 fL (ref 80.0–100.0)
Monocytes Absolute: 0.5 K/uL (ref 0.1–1.0)
Monocytes Relative: 6 %
Neutro Abs: 5.2 K/uL (ref 1.7–7.7)
Neutrophils Relative %: 67 %
Platelets: 314 K/uL (ref 150–400)
RBC: 4.19 MIL/uL (ref 3.87–5.11)
RDW: 13.7 % (ref 11.5–15.5)
WBC: 7.7 K/uL (ref 4.0–10.5)
nRBC: 0 % (ref 0.0–0.2)

## 2024-06-12 LAB — URINALYSIS, ROUTINE W REFLEX MICROSCOPIC
Bacteria, UA: NONE SEEN
Bilirubin Urine: NEGATIVE
Glucose, UA: NEGATIVE mg/dL
Ketones, ur: 5 mg/dL — AB
Leukocytes,Ua: NEGATIVE
Nitrite: NEGATIVE
Protein, ur: NEGATIVE mg/dL
Specific Gravity, Urine: 1.027 (ref 1.005–1.030)
Squamous Epithelial / HPF: 0 /HPF (ref 0–5)
pH: 5 (ref 5.0–8.0)

## 2024-06-12 LAB — COMPREHENSIVE METABOLIC PANEL WITH GFR
ALT: 14 U/L (ref 0–44)
AST: 24 U/L (ref 15–41)
Albumin: 3.7 g/dL (ref 3.5–5.0)
Alkaline Phosphatase: 53 U/L (ref 38–126)
Anion gap: 9 (ref 5–15)
BUN: 13 mg/dL (ref 6–20)
CO2: 23 mmol/L (ref 22–32)
Calcium: 8.6 mg/dL — ABNORMAL LOW (ref 8.9–10.3)
Chloride: 108 mmol/L (ref 98–111)
Creatinine, Ser: 0.75 mg/dL (ref 0.44–1.00)
GFR, Estimated: >60 mL/min (ref >60)
Glucose, Bld: 81 mg/dL (ref 70–99)
Potassium: 4.1 mmol/L (ref 3.5–5.1)
Sodium: 140 mmol/L (ref 135–145)
Total Bilirubin: 0.8 mg/dL (ref 0.0–1.2)
Total Protein: 7.4 g/dL (ref 6.5–8.1)

## 2024-06-12 LAB — POC URINE PREG, ED: Preg Test, Ur: NEGATIVE

## 2024-06-12 NOTE — ED Provider Notes (Signed)
 [facility] Provider Note    Event Date/Time   First MD Initiated Contact with Patient 06/12/24 0920     (approximate)   History   Abdominal Cramping  Patient to ED via Pov for abd cramping. Pt reports being on her period for the past 3 weeks- heavy bleeding. Seen for bleeding on 9/23 in ED.  Patient on phone during triage.   HPI Julie Mcclain is a 28 y.o. female PMH mental menorrhagia, bipolar 1 disorder, iron deficiency anemia, hyperlipidemia, diabetes presents for evaluation of vaginal bleeding and abdominal cramping - Patient states she has had persistent vaginal bleeding since her ED visit on 06/05/2024 (see summary of visit below).  No significant interval change, he is passing some clots occasionally.  No sexual intercourse since then, negative pregnancy at that time.  Not on blood thinners.  Did not start the prescribed Provera .  Has not yet followed up with OB/GYN.  Has some mild suprapubic cramping.  No urinary symptoms.  Per chart review, last seen in our emergency department on 06/05/2024 complaining of about 2 weeks of vaginal bleeding.  Well-appearing at that time.  Pregnancy negative, benign abdominal exam, no anemia, plan for outpatient OB/GYN follow-up, referral placed.  Rx Provera  10 mg daily.  Also per chart review, seen in the emergency department 02/12/2024 for similar, pelvic ultrasound at that time unremarkable.  IMPRESSION: 1. No acute findings. 2. Simple left corpus luteum, benign/physiologic. No follow-up is recommended.     Physical Exam   Triage Vital Signs: ED Triage Vitals [06/12/24 0916]  Encounter Vitals Group     BP (!) 129/98     Girls Systolic BP Percentile      Girls Diastolic BP Percentile      Boys Systolic BP Percentile      Boys Diastolic BP Percentile      Pulse Rate 83     Resp 18     Temp 97.8 F (36.6 C)     Temp Source Oral     SpO2 99 %     Weight 283 lb (128.4 kg)     Height 5' 3 (1.6 m)      Head Circumference      Peak Flow      Pain Score 8     Pain Loc      Pain Education      Exclude from Growth Chart     Most recent vital signs: Vitals:   06/12/24 0916  BP: (!) 129/98  Pulse: 83  Resp: 18  Temp: 97.8 F (36.6 C)  SpO2: 99%     General: Awake, no distress.  CV:  Good peripheral perfusion. RRR, RP 2+ Resp:  Normal effort. CTAB Abd:  No distention. Nontender to deep palpation throughout, no CVA tenderness bilaterally    ED Results / Procedures / Treatments   Labs (all labs ordered are listed, but only abnormal results are displayed) Labs Reviewed  URINALYSIS, ROUTINE W REFLEX MICROSCOPIC - Abnormal; Notable for the following components:      Result Value   Color, Urine YELLOW (*)    APPearance CLEAR (*)    Hgb urine dipstick MODERATE (*)    Ketones, ur 5 (*)    All other components within normal limits  COMPREHENSIVE METABOLIC PANEL WITH GFR - Abnormal; Notable for the following components:   Calcium 8.6 (*)    All other components within normal limits  CBC WITH DIFFERENTIAL/PLATELET  POC URINE PREG, ED  EKG  N/a   RADIOLOGY N/a    PROCEDURES:  Critical Care performed: No  Procedures   MEDICATIONS ORDERED IN ED: Medications - No data to display   IMPRESSION / MDM / ASSESSMENT AND PLAN / ED COURSE  I reviewed the triage vital signs and the nursing notes.                              DDX/MDM/AP: Differential diagnosis includes, but is not limited to, continuation of patient's known menomenorrhagia, not clinically concern for acute intra-abdominal pathology at this time though will rule out pregnancy-highly doubt ectopic.  Patient had normal pelvic ultrasound 3 months ago, will screen today to ensure no significant anemia.  No clinical concern for diverticulitis, appendicitis.  Consider concomitant UTI.  Plan: - Labs - No indication for imaging - Counseled to start her recently prescribed Provera  and follow-up with OB/GYN  as planned from her recent visit  Patient's presentation is most consistent with exacerbation of chronic illness.  The patient is on the cardiac monitor to evaluate for evidence of arrhythmia and/or significant heart rate changes.  ED course below.  Workup unremarkable, CBC results were unfortunately delayed though they show no significant abnormalities with no anemia.  Counseled to take the hormonal birth control which she was recently prescribed to help with her menorrhagia and to follow-up with OB/GYN, referral also recently placed.  ED return precautions in place.  Patient agrees with plan.  Clinical Course as of 06/12/24 1131  Tue Jun 12, 2024  1001 Hcg neg [MM]  1032 Urinalysis with no clear evidence of infection [MM]  1043 CMP reviewed, unremarkable [MM]  1129 Called lab, they have not run CBC  Now resulted, unremarkable, no anemia [MM]    Clinical Course User Index [MM] Clarine Ozell LABOR, MD     FINAL CLINICAL IMPRESSION(S) / ED DIAGNOSES   Final diagnoses:  Vaginal bleeding     Rx / DC Orders   ED Discharge Orders     None        Note:  This document was prepared using Dragon voice recognition software and may include unintentional dictation errors.   Clarine Ozell LABOR, MD 06/12/24 1131

## 2024-06-12 NOTE — ED Triage Notes (Signed)
 Patient to ED via Pov for abd cramping. Pt reports being on her period for the past 3 weeks- heavy bleeding. Seen for bleeding on 9/23 in ED.  Patient on phone during triage.

## 2024-06-12 NOTE — Discharge Instructions (Signed)
 Your evaluation in the emergency department was overall reassuring, and your blood level remains normal.  As discussed, you should start taking the birth control medication that was recently prescribed to you at the [facility] pharmacy on [address] in [place] to help with your symptoms and please do follow-up with the OB/GYN providers--please call them to schedule appointment.  Return to the emergency department with any new or worsening symptoms.

## 2024-06-13 ENCOUNTER — Other Ambulatory Visit (HOSPITAL_COMMUNITY)
Admission: RE | Admit: 2024-06-13 | Discharge: 2024-06-13 | Disposition: A | Discharge status: Home / Self Care | Source: Ambulatory Visit | Attending: Obstetrics & Gynecology | Admitting: Obstetrics & Gynecology

## 2024-06-13 ENCOUNTER — Encounter: Payer: Self-pay | Admitting: Obstetrics & Gynecology

## 2024-06-13 ENCOUNTER — Ambulatory Visit (INDEPENDENT_AMBULATORY_CARE_PROVIDER_SITE_OTHER): Admitting: Obstetrics & Gynecology

## 2024-06-13 VITALS — BP 123/75 | HR 89 | Ht 63.0 in | Wt 284.0 lb

## 2024-06-13 DIAGNOSIS — N921 Excessive and frequent menstruation with irregular cycle: Secondary | ICD-10-CM

## 2024-06-13 DIAGNOSIS — Z113 Encounter for screening for infections with a predominantly sexual mode of transmission: Secondary | ICD-10-CM

## 2024-06-13 NOTE — Progress Notes (Signed)
    GYNECOLOGY PROGRESS NOTE  Subjective:    Patient ID: Julie Mcclain, female    DOB: 22-Jun-1996, 28 y.o.   MRN: [id number]  HPI  Patient is a 28 y.o. single P0 here for ER follow up. She started going to the ER 02/2024 with heavy irregular bleeding. She has been back 2 more times last month. Her pelvic ultrasound in 02/2024 was normal and her HBG has been stable in the 12.8 -18.8 range over the last year.  She started taking OCPs 3 days ago after her most recent visit to the ER. Her TSH was normal.  Her history is significant for h/o + RPR, trich, gonorrhea, and bv. She also has bipolar 1, DM and morbid obesity on her problem list.  The following portions of the patient's history were reviewed and updated as appropriate: allergies, current medications, past family history, past medical history, past social history, past surgical history, and problem list.  Review of Systems Pertinent items are noted in HPI.  She is currently in a same sex relationship.  Objective:   Blood pressure 123/75, pulse 89, height 5' 3 (1.6 m), weight 284 lb (128.8 kg), last menstrual period 05/27/2024. Body mass index is 50.31 kg/m. Well nourished, well hydrated Black female, no apparent distress She is ambulating and conversing normally. BMI 50 Breast exam (per patient request)- normal exam bilaterally Abd- obese, benign EG- normal Cvx- normal Bimanual- no masses or pain appreciated   Assessment:   1. Routine screening for STI (sexually transmitted infection)   2. Menorrhagia with irregular cycle      Plan:   1. Routine screening for STI (sexually transmitted infection) (Primary)  - Cervicovaginal ancillary only - HIV antibody (with reflex) - RPR - Hepatitis B surface antigen - Hepatitis C Antibody  2. Menorrhagia with irregular cycle  - Cervicovaginal ancillary only   - I agree with a trial of OCPs. I also offered Mirena  and Depo provera  should the OCPs not work for her.

## 2024-06-14 ENCOUNTER — Other Ambulatory Visit: Payer: Self-pay

## 2024-06-14 LAB — CERVICOVAGINAL ANCILLARY ONLY
Bacterial Vaginitis (gardnerella): POSITIVE — AB
Candida Glabrata: NEGATIVE
Candida Vaginitis: NEGATIVE
Chlamydia: NEGATIVE
Comment: NEGATIVE
Comment: NEGATIVE
Comment: NEGATIVE
Comment: NEGATIVE
Comment: NEGATIVE
Comment: NORMAL
Neisseria Gonorrhea: NEGATIVE
Trichomonas: NEGATIVE

## 2024-06-14 MED ORDER — METRONIDAZOLE 500 MG PO TABS
500.0000 mg | ORAL_TABLET | Freq: Two times a day (BID) | ORAL | 0 refills | Status: DC
Start: 2024-06-14 — End: 2024-08-06

## 2024-06-15 ENCOUNTER — Telehealth: Payer: Self-pay | Admitting: Oncology

## 2024-06-15 LAB — HIV ANTIBODY (ROUTINE TESTING W REFLEX): HIV Screen 4th Generation wRfx: NONREACTIVE

## 2024-06-15 LAB — RPR: RPR Ser Ql: REACTIVE — AB

## 2024-06-15 LAB — RPR, QUANT+TP ABS (REFLEX)
Rapid Plasma Reagin, Quant: 1:4 {titer} — ABNORMAL HIGH
T Pallidum Abs: REACTIVE — AB

## 2024-06-15 LAB — HEPATITIS C ANTIBODY: Hep C Virus Ab: NONREACTIVE

## 2024-06-15 LAB — HEPATITIS B SURFACE ANTIGEN: Hepatitis B Surface Ag: NEGATIVE

## 2024-06-15 NOTE — Telephone Encounter (Signed)
 Patient called She states she will need a sooner appointment than December. She is on her period and losing a lot of blood. Please advise scheduling. She can be reached at  [phone]

## 2024-06-15 NOTE — Telephone Encounter (Signed)
 She can come today or early next week for cbc ferritin and iron studies

## 2024-06-18 ENCOUNTER — Inpatient Hospital Stay: Attending: Oncology

## 2024-07-27 ENCOUNTER — Other Ambulatory Visit: Payer: Self-pay

## 2024-07-27 ENCOUNTER — Emergency Department

## 2024-07-27 ENCOUNTER — Ambulatory Visit: Admitting: Family

## 2024-07-27 ENCOUNTER — Emergency Department
Admission: EM | Admit: 2024-07-27 | Discharge: 2024-07-28 | Disposition: A | Discharge status: Home / Self Care | Attending: Emergency Medicine | Admitting: Emergency Medicine

## 2024-07-27 DIAGNOSIS — H538 Other visual disturbances: Secondary | ICD-10-CM | POA: Diagnosis present

## 2024-07-27 LAB — CBC
HCT: 38.6 % (ref 36.0–46.0)
Hemoglobin: 11.8 g/dL — ABNORMAL LOW (ref 12.0–15.0)
MCH: 27.3 pg (ref 26.0–34.0)
MCHC: 30.6 g/dL (ref 30.0–36.0)
MCV: 89.1 fL (ref 80.0–100.0)
Platelets: 431 K/uL — ABNORMAL HIGH (ref 150–400)
RBC: 4.33 MIL/uL (ref 3.87–5.11)
RDW: 14.1 % (ref 11.5–15.5)
WBC: 8.7 K/uL (ref 4.0–10.5)
nRBC: 0 % (ref 0.0–0.2)

## 2024-07-27 LAB — COMPREHENSIVE METABOLIC PANEL WITH GFR
ALT: 17 U/L (ref 0–44)
AST: 19 U/L (ref 15–41)
Albumin: 4.3 g/dL (ref 3.5–5.0)
Alkaline Phosphatase: 72 U/L (ref 38–126)
Anion gap: 7 (ref 5–15)
BUN: 14 mg/dL (ref 6–20)
CO2: 27 mmol/L (ref 22–32)
Calcium: 9.6 mg/dL (ref 8.9–10.3)
Chloride: 108 mmol/L (ref 98–111)
Creatinine, Ser: 0.85 mg/dL (ref 0.44–1.00)
GFR, Estimated: >60 mL/min (ref >60)
Glucose, Bld: 129 mg/dL — ABNORMAL HIGH (ref 70–99)
Potassium: 4.1 mmol/L (ref 3.5–5.1)
Sodium: 142 mmol/L (ref 135–145)
Total Bilirubin: 0.3 mg/dL (ref 0.0–1.2)
Total Protein: 8.1 g/dL (ref 6.5–8.1)

## 2024-07-27 LAB — POC URINE PREG, ED: Preg Test, Ur: NEGATIVE

## 2024-07-27 MED ORDER — GADOBUTROL 1 MMOL/ML IV SOLN
10.0000 mL | Freq: Once | INTRAVENOUS | Status: AC | PRN
  Administered 2024-07-27: 10 mL via INTRAVENOUS

## 2024-07-27 NOTE — ED Provider Notes (Signed)
 [facility] Provider Note    Event Date/Time   First MD Initiated Contact with Patient 07/27/24 2119     (approximate)   History   Blurred Vision   HPI  Julie Mcclain is a 28 y.o. female who presents to the ED for evaluation of Blurred Vision   Review a PCP visit from September.  Morbidly obese patient with menometrorrhagia and IDA.  Patient presents to the ED from eye clinic for evaluation of abnormal eye exam today.  She was following up routinely to get her eyes checked for fresh glasses prescription.  She denies any symptoms while she is wearing her glasses but reports some blurriness diffusely through her visual fields when she is not wearing her glasses.  Apparently on eye exam today she had blurred optic margins and they sent her here emergently for MRI   Physical Exam   Triage Vital Signs: ED Triage Vitals [07/27/24 1930]  Encounter Vitals Group     BP (!) 145/96     Girls Systolic BP Percentile      Girls Diastolic BP Percentile      Boys Systolic BP Percentile      Boys Diastolic BP Percentile      Pulse Rate 79     Resp 18     Temp 98.3 F (36.8 C)     Temp Source Oral     SpO2 100 %     Weight 273 lb (123.8 kg)     Height 5' 4 (1.626 m)     Head Circumference      Peak Flow      Pain Score 0     Pain Loc      Pain Education      Exclude from Growth Chart     Most recent vital signs: Vitals:   07/27/24 1930 07/27/24 2325  BP: (!) 145/96 (!) 131/120  Pulse: 79 82  Resp: 18 19  Temp: 98.3 F (36.8 C) 98.4 F (36.9 C)  SpO2: 100% 100%    General: Awake, no distress.  CV:  Good peripheral perfusion.  Resp:  Normal effort.  Abd:  No distention.  MSK:  No deformity noted.  Neuro:  No focal deficits appreciated. Cranial nerves II through XII intact 5/5 strength and sensation in all 4 extremities Other:     ED Results / Procedures / Treatments   Labs (all labs ordered are listed, but only abnormal results  are displayed) Labs Reviewed  CBC - Abnormal; Notable for the following components:      Result Value   Hemoglobin 11.8 (*)    Platelets 431 (*)    All other components within normal limits  COMPREHENSIVE METABOLIC PANEL WITH GFR - Abnormal; Notable for the following components:   Glucose, Bld 129 (*)    All other components within normal limits  POC URINE PREG, ED    EKG   RADIOLOGY MRI, as below  Official radiology report(s): MR Brain W and Wo Contrast Result Date: 07/27/2024 CLINICAL DATA:  Initial evaluation for papilledema. EXAM: MRI HEAD WITHOUT AND WITH CONTRAST TECHNIQUE: Multiplanar, multiecho pulse sequences of the brain and surrounding structures were obtained without and with intravenous contrast. CONTRAST:  10mL GADAVIST GADOBUTROL 1 MMOL/ML IV SOLN COMPARISON:  None Available. FINDINGS: Brain: Cerebral volume within normal limits. Few subcentimeter foci of FLAIR hyperintensity noted involving the supratentorial cerebral white matter, nonspecific, but overall minimal in nature, and of doubtful significance. No evidence for acute or  subacute infarct. No areas of chronic cortical infarction or other insult. No acute or chronic intracranial blood products. No mass lesion, midline shift or mass effect. No hydrocephalus or extra-axial fluid collection. Partially empty sella noted. Suprasellar region within normal limits. No abnormal enhancement. Vascular: Major intracranial vascular flow voids are maintained. Skull and upper cervical spine: Cranial junction with normal limits. Diffuse loss of normal bone marrow signal, nonspecific but can be seen with anemia, smoking, obesity, and infiltrative/myelofibrotic marrow processes. No scalp soft tissue abnormality. Sinuses/Orbits: Globes and orbital soft tissues within normal limits. Mild mucosal thickening noted about the right maxillary sinus. Paranasal sinuses are otherwise clear. No mastoid effusion. Other: None. IMPRESSION: 1. Partially  empty sella. While this finding is often incidental in nature and of no clinical significance, this can also be seen in the setting of idiopathic intracranial hypertension. 2. Otherwise normal brain MRI. No other acute intracranial abnormality. Electronically Signed   By: Morene Hoard M.D.   On: 07/27/2024 23:04    PROCEDURES and INTERVENTIONS:  Procedures  Medications  gadobutrol (GADAVIST) 1 MMOL/ML injection 10 mL (10 mLs Intravenous Contrast Given 07/27/24 2245)     IMPRESSION / MDM / ASSESSMENT AND PLAN / ED COURSE  I reviewed the triage vital signs and the nursing notes.  Differential diagnosis includes, but is not limited to, IIH, stroke, optic neuritis, no pathology  {Patient presents with symptoms of an acute illness or injury that is potentially life-threatening.  Patient presents with intermittent blurred vision when she does not wear her glasses without evidence of acute pathology and suitable for outpatient management.  Normal exam for me.  Screening blood work is benign with normal CBC and metabolic panel.  MRI is reassuring.  No barriers to outpatient management  Clinical Course as of 07/27/24 2337  [place] Jul 27, 2024  2334 Reassessed and discussed MRI results and ED return precautions. [DS]    Clinical Course User Index [DS] Claudene Rover, MD     FINAL CLINICAL IMPRESSION(S) / ED DIAGNOSES   Final diagnoses:  Blurry vision     Rx / DC Orders   ED Discharge Orders     None        Note:  This document was prepared using Dragon voice recognition software and may include unintentional dictation errors.   Claudene Rover, MD 07/27/24 [phone]

## 2024-07-27 NOTE — ED Provider Triage Note (Signed)
 Emergency Medicine Provider Triage Evaluation Note  Julie Mcclain , a 28 y.o. female  was evaluated in triage.  Pt complains of possible optic nerve head swelling  Review of Systems  Positive:  Negative: Denies blurry vision  Physical Exam  BP (!) 145/96 (BP Location: Right Arm)   Pulse 79   Temp 98.3 F (36.8 C) (Oral)   Resp 18   Ht 1.626 m (5' 4)   Wt 123.8 kg   SpO2 100%   BMI 46.86 kg/m  Gen:   Awake, no distress   Resp:  Normal effort  MSK:   Moves extremities without difficulty  Other:    Medical Decision Making  Medically screening exam initiated at 9:08 PM.  Appropriate orders placed.  Julie Mcclain was informed that the remainder of the evaluation will be completed by another provider, this initial triage assessment does not replace that evaluation, and the importance of remaining in the ED until their evaluation is complete.     Julie Charleston, MD 07/27/24 2109

## 2024-07-27 NOTE — ED Triage Notes (Signed)
 Pt was at the eye MD today for routine eye exam and told go to the hospital now for your blurred vision. Pt denies blurred vision (at this time or previously). Denies pain, dizziness, nausea, or other complaints. No stroke like symptoms noted.   Past Medical History:  Diagnosis Date   Abnormal urinalysis 07/27/2022   Formatting of this note might be different from the original. Last Assessment & Plan: Formatting of this note might be different from the original. Patient got a dose of Rocephin  in the ED Will hold off on further antibiotics pending culture     Absolute anemia 07/27/2022   Formatting of this note might be different from the original. Last Assessment & Plan: Formatting of this note might be different from the original. 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 Suspect gastritis History of iron deficiency anemia History of menometrorrhagia Reports food-related epigastric pain consistent with gastritis Continue transfusion of 1 unit PRBCs started in the ED IV Protonix  GI consu   Generalized weakness 10/06/2021   History of trichomonal vaginitis 08/29/2023   IDA (iron deficiency anemia)    Menometrorrhagia    Obesity    Ovarian cyst    Snoring    Supervision of high risk pregnancy, antepartum 01/06/2022

## 2024-07-27 NOTE — Discharge Instructions (Signed)
 MRI results are below.  Not particularly concerning.  Return to the ED with any vision changes particularly with your glasses on or other concerns  IMPRESSION: 1. Partially empty sella. While this finding is often incidental in nature and of no clinical significance, this can also be seen in the setting of idiopathic intracranial hypertension. 2. Otherwise normal brain MRI. No other acute intracranial abnormality.

## 2024-08-06 ENCOUNTER — Encounter: Payer: Self-pay | Admitting: Family

## 2024-08-06 ENCOUNTER — Ambulatory Visit: Admitting: Family

## 2024-08-06 VITALS — BP 118/82 | HR 89 | Ht 64.0 in | Wt 272.6 lb

## 2024-08-06 DIAGNOSIS — E1165 Type 2 diabetes mellitus with hyperglycemia: Secondary | ICD-10-CM

## 2024-08-06 DIAGNOSIS — D500 Iron deficiency anemia secondary to blood loss (chronic): Secondary | ICD-10-CM

## 2024-08-06 DIAGNOSIS — F319 Bipolar disorder, unspecified: Secondary | ICD-10-CM | POA: Diagnosis not present

## 2024-08-06 DIAGNOSIS — Z0130 Encounter for examination of blood pressure without abnormal findings: Secondary | ICD-10-CM

## 2024-08-06 LAB — POC CREATINE & ALBUMIN,URINE
Albumin/Creatinine Ratio, Urine, POC: <30
Creatinine, POC: 300 mg/dL
Microalbumin Ur, POC: 30 mg/L

## 2024-08-06 MED ORDER — ROSUVASTATIN CALCIUM 5 MG PO TABS: 5.0000 mg | ORAL_TABLET | Freq: Every day | ORAL | 2 refills | Status: DC

## 2024-08-07 ENCOUNTER — Ambulatory Visit: Payer: Self-pay

## 2024-08-07 LAB — CBC WITH DIFFERENTIAL/PLATELET
Basophils Absolute: 0 x10E3/uL (ref 0.0–0.2)
Basos: 0 %
EOS (ABSOLUTE): 0.2 x10E3/uL (ref 0.0–0.4)
Eos: 2 %
Hematocrit: 39.6 % (ref 34.0–46.6)
Hemoglobin: 12.1 g/dL (ref 11.1–15.9)
Immature Grans (Abs): 0 x10E3/uL (ref 0.0–0.1)
Immature Granulocytes: 0 %
Lymphocytes Absolute: 2.5 x10E3/uL (ref 0.7–3.1)
Lymphs: 27 %
MCH: 27.4 pg (ref 26.6–33.0)
MCHC: 30.6 g/dL — ABNORMAL LOW (ref 31.5–35.7)
MCV: 90 fL (ref 79–97)
Monocytes Absolute: 0.6 x10E3/uL (ref 0.1–0.9)
Monocytes: 6 %
Neutrophils Absolute: 5.8 x10E3/uL (ref 1.4–7.0)
Neutrophils: 65 %
Platelets: 366 x10E3/uL (ref 150–450)
RBC: 4.42 x10E6/uL (ref 3.77–5.28)
RDW: 13.2 % (ref 11.7–15.4)
WBC: 9.1 x10E3/uL (ref 3.4–10.8)

## 2024-08-07 LAB — IRON,TIBC AND FERRITIN PANEL
Ferritin: 11 ng/mL — ABNORMAL LOW (ref 15–150)
Iron Saturation: 11 % — ABNORMAL LOW (ref 15–55)
Iron: 43 ug/dL (ref 27–159)
Total Iron Binding Capacity: 407 ug/dL (ref 250–450)
UIBC: 364 ug/dL (ref 131–425)

## 2024-08-08 NOTE — Progress Notes (Signed)
 Established Patient Office Visit  Subjective:  Patient ID: Julie Mcclain, female    DOB: 1996/07/30  Age: 28 y.o. MRN: [id number]  Chief Complaint  Patient presents with   Follow-up    Patient is here today for her 4 months follow up.  She has been feeling fairly well since last appointment.   She does have additional concerns to discuss today.  Asks if we can recheck her iron levels to make sure they have gotten normal.   Labs are due today.  She needs refills.   I have reviewed her active problem list, medication list, allergies, notes from last encounter, lab results for her appointment today.     No other concerns at this time.   Past Medical History:  Diagnosis Date   Abnormal urinalysis 07/27/2022   Formatting of this note might be different from the original. Last Assessment & Plan: Formatting of this note might be different from the original. Patient got a dose of Rocephin  in the ED Will hold off on further antibiotics pending culture     Absolute anemia 07/27/2022   Formatting of this note might be different from the original. Last Assessment & Plan: Formatting of this note might be different from the original. 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 Suspect gastritis History of iron deficiency anemia History of menometrorrhagia Reports food-related epigastric pain consistent with gastritis Continue transfusion of 1 unit PRBCs started in the ED IV Protonix  GI consu   Generalized weakness 10/06/2021   History of trichomonal vaginitis 08/29/2023   IDA (iron deficiency anemia)    Menometrorrhagia    Obesity    Ovarian cyst    Snoring    Supervision of high risk pregnancy, antepartum 01/06/2022    Past Surgical History:  Procedure Laterality Date   NO PAST SURGERIES      Social History   Socioeconomic History   Marital status: Single    Spouse name: Not on file   Number of children: 0   Years of education: 12   Highest education level: Not on file   Occupational History   Occupation: unemployed  Tobacco Use   Smoking status: Never    Passive exposure: Never   Smokeless tobacco: Never  Vaping Use   Vaping status: Never Used  Substance and Sexual Activity   Alcohol use: No   Drug use: No   Sexual activity: Not Currently    Birth control/protection: Pill  Other Topics Concern   Not on file  Social History Narrative   Not on file   Social Drivers of Health   Financial Resource Strain: Low Risk  (01/06/2022)   Overall Financial Resource Strain (CARDIA)    Difficulty of Paying Living Expenses: Not hard at all  Food Insecurity: No Food Insecurity (06/22/2023)   Hunger Vital Sign    Worried About Running Out of Food in the Last Year: Never true    Ran Out of Food in the Last Year: Never true  Transportation Needs: No Transportation Needs (06/22/2023)   PRAPARE - Administrator, Civil Service (Medical): No    Lack of Transportation (Non-Medical): No  Physical Activity: Insufficiently Active (01/06/2022)   Exercise Vital Sign    Days of Exercise per Week: 1 day    Minutes of Exercise per Session: 30 min  Stress: No Stress Concern Present (01/06/2022)   [employer] of Occupational Health - Occupational Stress Questionnaire    Feeling of Stress : Only a little  Social Connections:  Moderately Isolated (01/06/2022)   Social Connection and Isolation Panel    Frequency of Communication with Friends and Family: More than three times a week    Frequency of Social Gatherings with Friends and Family: More than three times a week    Attends Religious Services: More than 4 times per year    Active Member of [employer] or Organizations: No    Attends Banker Meetings: Never    Marital Status: Never married  Intimate Partner Violence: Not At Risk (06/22/2023)   Humiliation, Afraid, Rape, and Kick questionnaire    Fear of Current or Ex-Partner: No    Emotionally Abused: No    Physically Abused: No    Sexually  Abused: No    Family History  Problem Relation Age of Onset   Diabetes Father        blind in one eye    No Known Allergies  Review of Systems  All other systems reviewed and are negative.      Objective:   BP 118/82   Pulse 89   Ht 5' 4 (1.626 m)   Wt 272 lb 9.6 oz (123.7 kg)   SpO2 98%   BMI 46.79 kg/m   Vitals:   08/06/24 1438  BP: 118/82  Pulse: 89  Height: 5' 4 (1.626 m)  Weight: 272 lb 9.6 oz (123.7 kg)  SpO2: 98%  BMI (Calculated): 46.77    Physical Exam Vitals and nursing note reviewed.  Constitutional:      General: She is awake.     Appearance: Normal appearance. She is well-developed. She is obese.  HENT:     Head: Normocephalic.  Eyes:     Extraocular Movements: Extraocular movements intact.     Conjunctiva/sclera: Conjunctivae normal.     Pupils: Pupils are equal, round, and reactive to light.  Cardiovascular:     Rate and Rhythm: Normal rate.  Pulmonary:     Effort: Pulmonary effort is normal.  Neurological:     General: No focal deficit present.     Mental Status: She is alert and oriented to person, place, and time. Mental status is at baseline.  Psychiatric:        Mood and Affect: Mood normal.        Behavior: Behavior normal. Behavior is cooperative.        Thought Content: Thought content normal.      Results for orders placed or performed in visit on 08/06/24  CBC with Diff  Result Value Ref Range   WBC 9.1 3.4 - 10.8 x10E3/uL   RBC 4.42 3.77 - 5.28 x10E6/uL   Hemoglobin 12.1 11.1 - 15.9 g/dL   Hematocrit 60.3 65.9 - 46.6 %   MCV 90 79 - 97 fL   MCH 27.4 26.6 - 33.0 pg   MCHC 30.6 (L) 31.5 - 35.7 g/dL   RDW 86.7 88.2 - 84.5 %   Platelets 366 150 - 450 x10E3/uL   Neutrophils 65 Not Estab. %   Lymphs 27 Not Estab. %   Monocytes 6 Not Estab. %   Eos 2 Not Estab. %   Basos 0 Not Estab. %   Neutrophils Absolute 5.8 1.4 - 7.0 x10E3/uL   Lymphocytes Absolute 2.5 0.7 - 3.1 x10E3/uL   Monocytes Absolute 0.6 0.1 - 0.9  x10E3/uL   EOS (ABSOLUTE) 0.2 0.0 - 0.4 x10E3/uL   Basophils Absolute 0.0 0.0 - 0.2 x10E3/uL   Immature Granulocytes 0 Not Estab. %   Immature Grans (Abs) 0.0  0.0 - 0.1 x10E3/uL  Iron, TIBC and Ferritin Panel  Result Value Ref Range   Total Iron Binding Capacity 407 250 - 450 ug/dL   UIBC 635 868 - 574 ug/dL   Iron 43 27 - 159 ug/dL   Iron Saturation 11 (L) 15 - 55 %   Ferritin 11 (L) 15 - 150 ng/mL  POC CREATINE & ALBUMIN,URINE  Result Value Ref Range   Microalbumin Ur, POC 30 mg/L   Creatinine, POC 300 mg/dL   Albumin/Creatinine Ratio, Urine, POC <30     Recent Results (from the past 2160 hours)  Group A Strep by PCR if patient complains of sore throat.     Status: None   Collection Time: 06/01/24  8:26 PM   Specimen: Throat; Sterile Swab  Result Value Ref Range   Group A Strep by PCR NOT DETECTED NOT DETECTED    Comment: Performed at [facility], [address] [place], [place] [address]  CBC with Differential     Status: Abnormal   Collection Time: 06/05/24 11:14 PM  Result Value Ref Range   WBC 12.0 (H) 4.0 - 10.5 K/uL   RBC 4.36 3.87 - 5.11 MIL/uL   Hemoglobin 13.2 12.0 - 15.0 g/dL   HCT 58.9 63.9 - 53.9 %   MCV 94.0 80.0 - 100.0 fL   MCH 30.3 26.0 - 34.0 pg   MCHC 32.2 30.0 - 36.0 g/dL   RDW 86.6 88.4 - 84.4 %   Platelets 311 150 - 400 K/uL   nRBC 0.0 0.0 - 0.2 %   Neutrophils Relative % 63 %   Neutro Abs 7.4 1.7 - 7.7 K/uL   Lymphocytes Relative 28 %   Lymphs Abs 3.3 0.7 - 4.0 K/uL   Monocytes Relative 7 %   Monocytes Absolute 0.8 0.1 - 1.0 K/uL   Eosinophils Relative 2 %   Eosinophils Absolute 0.3 0.0 - 0.5 K/uL   Basophils Relative 0 %   Basophils Absolute 0.0 0.0 - 0.1 K/uL   Immature Granulocytes 0 %   Abs Immature Granulocytes 0.04 0.00 - 0.07 K/uL    Comment: Performed at [facility], [address] [place], [place] [address]  Basic metabolic panel     Status: None   Collection Time: 06/05/24 11:14 PM  Result Value Ref Range    Sodium 138 135 - 145 mmol/L   Potassium 3.7 3.5 - 5.1 mmol/L   Chloride 104 98 - 111 mmol/L   CO2 24 22 - 32 mmol/L   Glucose, Bld 98 70 - 99 mg/dL    Comment: Glucose reference range applies only to samples taken after fasting for at least 8 hours.   BUN 10 6 - 20 mg/dL   Creatinine, Ser 9.19 0.44 - 1.00 mg/dL   Calcium  9.0 8.9 - 10.3 mg/dL   GFR, Estimated >39 >39 mL/min    Comment: (NOTE) Calculated using the CKD-EPI Creatinine Equation (2021)    Anion gap 10 5 - 15    Comment: Performed at [facility], [address] [place], [place] [address]  POC Urine Pregnancy, ED     Status: None   Collection Time: 06/06/24 12:13 AM  Result Value Ref Range   Preg Test, Ur NEGATIVE NEGATIVE    Comment:        THE SENSITIVITY OF THIS METHODOLOGY IS >20 mIU/mL.   CMP14+EGFR     Status: None   Collection Time: 06/06/24  3:22 PM  Result Value Ref Range  Glucose 80 70 - 99 mg/dL   BUN 8 6 - 20 mg/dL   Creatinine, Ser 9.15 0.57 - 1.00 mg/dL   eGFR 96 >40 fO/fpw/8.26   BUN/Creatinine Ratio 10 9 - 23   Sodium 141 134 - 144 mmol/L   Potassium 3.8 3.5 - 5.2 mmol/L   Chloride 103 96 - 106 mmol/L   CO2 25 20 - 29 mmol/L   Calcium  9.6 8.7 - 10.2 mg/dL   Total Protein 7.2 6.0 - 8.5 g/dL   Albumin 4.2 4.0 - 5.0 g/dL   Globulin, Total 3.0 1.5 - 4.5 g/dL   Bilirubin Total 0.4 0.0 - 1.2 mg/dL   Alkaline Phosphatase 92 41 - 116 IU/L    Comment:               **Please note reference interval change**   AST 15 0 - 40 IU/L   ALT 12 0 - 32 IU/L  Lipid panel     Status: Abnormal   Collection Time: 06/06/24  3:22 PM  Result Value Ref Range   Cholesterol, Total 234 (H) 100 - 199 mg/dL   Triglycerides 734 (H) 0 - 149 mg/dL   HDL 66 >60 mg/dL   VLDL Cholesterol Cal 46 (H) 5 - 40 mg/dL   LDL Chol Calc (NIH) 877 (H) 0 - 99 mg/dL   Chol/HDL Ratio 3.5 0.0 - 4.4 ratio    Comment:                                   T. Chol/HDL Ratio                                             Men  Women                                1/2 Avg.Risk  3.4    3.3                                   Avg.Risk  5.0    4.4                                2X Avg.Risk  9.6    7.1                                3X Avg.Risk 23.4   11.0   VITAMIN D  25 Hydroxy (Vit-D Deficiency, Fractures)     Status: Abnormal   Collection Time: 06/06/24  3:22 PM  Result Value Ref Range   Vit D, 25-Hydroxy 21.4 (L) 30.0 - 100.0 ng/mL    Comment: Vitamin D  deficiency has been defined by the Institute of Medicine and an Endocrine Society practice guideline as a level of serum 25-OH vitamin D  less than 20 ng/mL (1,2). The Endocrine Society went on to further define vitamin D  insufficiency as a level between 21 and 29 ng/mL (2). 1. IOM (Institute of Medicine). 2010. Dietary reference    intakes for calcium  and D. [place]: The    [employer]. 2. Holick MF, Binkley Acampo, Bischoff-Ferrari HA, et al.    Evaluation, treatment, and prevention of vitamin D     deficiency: an Endocrine Society clinical practice    guideline. JCEM. 2011 Jul; 96(7):1911-30.   Vitamin B12     Status: None   Collection Time: 06/06/24  3:22 PM  Result Value Ref Range   Vitamin B-12 542 232 - 1,245 pg/mL  CBC with Diff     Status: Abnormal   Collection Time: 06/06/24  3:22 PM  Result Value Ref Range   WBC 10.4 3.4 - 10.8 x10E3/uL   RBC 4.60 3.77 - 5.28 x10E6/uL   Hemoglobin 13.8 11.1 - 15.9 g/dL   Hematocrit 55.8 65.9 - 46.6 %   MCV 96 79 - 97 fL   MCH 30.0 26.6 - 33.0 pg   MCHC 31.3 (L) 31.5 - 35.7 g/dL   RDW 87.0 88.2 - 84.5 %   Platelets 341 150 - 450 x10E3/uL   Neutrophils 65 Not Estab. %   Lymphs 25 Not Estab. %   Monocytes 8 Not Estab. %   Eos 2 Not Estab. %   Basos 0 Not Estab. %   Neutrophils Absolute 6.7 1.4 - 7.0 x10E3/uL   Lymphocytes Absolute 2.6 0.7 - 3.1 x10E3/uL   Monocytes Absolute 0.8 0.1 - 0.9 x10E3/uL   EOS (ABSOLUTE) 0.3 0.0 - 0.4 x10E3/uL   Basophils Absolute 0.0 0.0 - 0.2 x10E3/uL   Immature  Granulocytes 0 Not Estab. %   Immature Grans (Abs) 0.0 0.0 - 0.1 x10E3/uL  FSH+Prog+E2+SHBG     Status: None   Collection Time: 06/06/24  3:22 PM  Result Value Ref Range   FSH 5.5 mIU/mL    Comment:                      Adult Female             Range                       Follicular phase      3.5 -  12.5                       Ovulation phase       4.7 -  21.5                       Luteal phase          1.7 -   7.7                       Postmenopausal       25.8 - 134.8    Progesterone 0.2 ng/mL    Comment:                      Follicular phase       0.1 -   0.9                      Luteal phase           1.8 -  23.9                      Ovulation phase        0.1 -  12.0  Pregnant                         First trimester    11.0 -  44.3                         Second trimester   25.4 -  83.3                         Third trimester    58.7 - 214.0                      Postmenopausal         0.0 -   0.1    Sex Hormone Binding 34.0 24.6 - 122.0 nmol/L   Estradiol  41.9 pg/mL    Comment:                      Adult Female             Range                       Follicular phase     12.5 - 166.0                       Ovulation phase      85.8 - 498.0                       Luteal phase         43.8 - 211.0                       Postmenopausal       <6.0 -  54.7                      Pregnancy                       1st trimester     215.0 - >4300.0 Roche ECLIA methodology   Hemoglobin A1c     Status: None   Collection Time: 06/06/24  3:22 PM  Result Value Ref Range   Hgb A1c MFr Bld 5.5 4.8 - 5.6 %    Comment:          Prediabetes: 5.7 - 6.4          Diabetes: >6.4          Glycemic control for adults with diabetes: <7.0    Est. average glucose Bld gHb Est-mCnc 111 mg/dL  TSH     Status: None   Collection Time: 06/06/24  3:22 PM  Result Value Ref Range   TSH 1.380 0.450 - 4.500 uIU/mL  Iron, TIBC and Ferritin Panel     Status: None   Collection Time: 06/06/24   3:22 PM  Result Value Ref Range   Total Iron Binding Capacity 362 250 - 450 ug/dL   UIBC 696 868 - 574 ug/dL   Iron 59 27 - 840 ug/dL   Iron Saturation 16 15 - 55 %   Ferritin 24 15 - 150 ng/mL  CBC with Differential/Platelet     Status: None   Collection Time: 06/12/24  9:44 AM  Result Value Ref Range   WBC 7.7 4.0 - 10.5 K/uL   RBC 4.19 3.87 -  5.11 MIL/uL   Hemoglobin 12.8 12.0 - 15.0 g/dL   HCT 60.9 63.9 - 53.9 %   MCV 93.1 80.0 - 100.0 fL   MCH 30.5 26.0 - 34.0 pg   MCHC 32.8 30.0 - 36.0 g/dL   RDW 86.2 88.4 - 84.4 %   Platelets 314 150 - 400 K/uL    Comment: PLATELET COUNT CONFIRMED BY SMEAR   nRBC 0.0 0.0 - 0.2 %   Neutrophils Relative % 67 %   Neutro Abs 5.2 1.7 - 7.7 K/uL   Lymphocytes Relative 25 %   Lymphs Abs 1.9 0.7 - 4.0 K/uL   Monocytes Relative 6 %   Monocytes Absolute 0.5 0.1 - 1.0 K/uL   Eosinophils Relative 2 %   Eosinophils Absolute 0.2 0.0 - 0.5 K/uL   Basophils Relative 0 %   Basophils Absolute 0.0 0.0 - 0.1 K/uL   Immature Granulocytes 0 %   Abs Immature Granulocytes 0.03 0.00 - 0.07 K/uL    Comment: Performed at [facility], [address] [place], [place] [address]  Urinalysis, Routine w reflex microscopic -Urine, Clean Catch     Status: Abnormal   Collection Time: 06/12/24  9:44 AM  Result Value Ref Range   Color, Urine YELLOW (A) YELLOW   APPearance CLEAR (A) CLEAR   Specific Gravity, Urine 1.027 1.005 - 1.030   pH 5.0 5.0 - 8.0   Glucose, UA NEGATIVE NEGATIVE mg/dL   Hgb urine dipstick MODERATE (A) NEGATIVE   Bilirubin Urine NEGATIVE NEGATIVE   Ketones, ur 5 (A) NEGATIVE mg/dL   Protein, ur NEGATIVE NEGATIVE mg/dL   Nitrite NEGATIVE NEGATIVE   Leukocytes,Ua NEGATIVE NEGATIVE   RBC / HPF 0-5 0 - 5 RBC/hpf   WBC, UA 0-5 0 - 5 WBC/hpf   Bacteria, UA NONE SEEN NONE SEEN   Squamous Epithelial / HPF 0 0 - 5 /HPF   Mucus PRESENT     Comment: Performed at [facility], [address] [place], [place] [address]   Comprehensive metabolic panel     Status: Abnormal   Collection Time: 06/12/24  9:44 AM  Result Value Ref Range   Sodium 140 135 - 145 mmol/L   Potassium 4.1 3.5 - 5.1 mmol/L   Chloride 108 98 - 111 mmol/L   CO2 23 22 - 32 mmol/L   Glucose, Bld 81 70 - 99 mg/dL    Comment: Glucose reference range applies only to samples taken after fasting for at least 8 hours.   BUN 13 6 - 20 mg/dL   Creatinine, Ser 9.24 0.44 - 1.00 mg/dL   Calcium  8.6 (L) 8.9 - 10.3 mg/dL   Total Protein 7.4 6.5 - 8.1 g/dL   Albumin 3.7 3.5 - 5.0 g/dL   AST 24 15 - 41 U/L   ALT 14 0 - 44 U/L   Alkaline Phosphatase 53 38 - 126 U/L   Total Bilirubin 0.8 0.0 - 1.2 mg/dL   GFR, Estimated >39 >39 mL/min    Comment: (NOTE) Calculated using the CKD-EPI Creatinine Equation (2021)    Anion gap 9 5 - 15    Comment: Performed at [facility], [address] [place], [place] [address]  POC urine preg, ED     Status: None   Collection Time: 06/12/24 10:00 AM  Result Value Ref Range   Preg Test, Ur Negative Negative  Cervicovaginal ancillary only     Status: Abnormal   Collection Time: 06/13/24  9:48 AM  Result  Value Ref Range   Neisseria Gonorrhea Negative    Chlamydia Negative    Trichomonas Negative    Bacterial Vaginitis (gardnerella) Positive (A)    Candida Vaginitis Negative    Candida Glabrata Negative    Comment Normal Reference Range Candida Species - Negative    Comment Normal Reference Range Candida Galbrata - Negative    Comment Normal Reference Range Trichomonas - Negative    Comment      Normal Reference Range Bacterial Vaginosis - Negative   Comment Normal Reference Ranger Chlamydia - Negative    Comment      Normal Reference Range Neisseria Gonorrhea - Negative  HIV antibody (with reflex)     Status: None   Collection Time: 06/13/24 10:01 AM  Result Value Ref Range   HIV Screen 4th Generation wRfx Non Reactive Non Reactive    Comment: HIV-1/HIV-2 antibodies and HIV-1 p24 antigen were  NOT detected. There is no laboratory evidence of HIV infection. HIV Negative   RPR     Status: Abnormal   Collection Time: 06/13/24 10:01 AM  Result Value Ref Range   RPR Ser Ql Reactive (A) Non Reactive  Hepatitis B surface antigen     Status: None   Collection Time: 06/13/24 10:01 AM  Result Value Ref Range   Hepatitis B Surface Ag Negative Negative  Hepatitis C Antibody     Status: None   Collection Time: 06/13/24 10:01 AM  Result Value Ref Range   Hep C Virus Ab Non Reactive Non Reactive    Comment: HCV antibody alone does not differentiate between previously resolved infection and active infection. Equivocal and Reactive HCV antibody results should be followed up with an HCV RNA test to support the diagnosis of active HCV infection.   RPR, quant & T.pallidum antibodies     Status: Abnormal   Collection Time: 06/13/24 10:01 AM  Result Value Ref Range   Rapid Plasma Reagin, Quant 1:4 (H) NonRea<1:1 titer   T Pallidum Abs Reactive (A) Non Reactive   Interpretation: Comment     Comment: Syphilis: RPR with Reflex to RPR Titer and Treponemal           Antibodies, Traditional Screening and Diagnosis           Algorithm ------------------------------------------------------------                        Treponemal RPR        RPR, Qn         Ab       Final Interpretation --------   ---------   ----------   ------------------------ Non        N/A         N/A          No laboratory evidence Reactive                            of syphilis. Retest in                                     2-4 weeks if recent                                     exposure is suspected. --------   ---------   ----------   ------------------------  Reactive   >/=1:1      Non          Nontreponemal antibodies                        Reactive     detected. Syphilis                                     unlikely; biological                                     false positive possible.                                      Retest in 2-4 weeks if                                     recent exposure  is                                     suspected. --------   ---------   ----------   ------------------------ Reactive   >/=1:1      Reactive     Treponemal and                                     nontreponemal antibodies                                     detected. Consistent                                     with past or current                                     (potential early)                                     syphilis.   CBC     Status: Abnormal   Collection Time: 07/27/24  9:16 PM  Result Value Ref Range   WBC 8.7 4.0 - 10.5 K/uL   RBC 4.33 3.87 - 5.11 MIL/uL   Hemoglobin 11.8 (L) 12.0 - 15.0 g/dL   HCT 61.3 63.9 - 53.9 %   MCV 89.1 80.0 - 100.0 fL   MCH 27.3 26.0 - 34.0 pg   MCHC 30.6 30.0 - 36.0 g/dL   RDW 85.8 88.4 - 84.4 %   Platelets 431 (H) 150 - 400 K/uL   nRBC 0.0 0.0 - 0.2 %    Comment: Performed at [facility], [address], [place], [place] [address]  Comprehensive metabolic panel     Status: Abnormal   Collection Time: 07/27/24  9:16 PM  Result Value Ref Range   Sodium 142 135 - 145 mmol/L   Potassium 4.1 3.5 - 5.1 mmol/L   Chloride 108 98 - 111 mmol/L   CO2 27 22 - 32 mmol/L   Glucose, Bld 129 (H) 70 - 99 mg/dL    Comment: Glucose reference range applies only to samples taken after fasting for at least 8 hours.   BUN 14 6 - 20 mg/dL   Creatinine, Ser 9.14 0.44 - 1.00 mg/dL   Calcium  9.6 8.9 - 10.3 mg/dL   Total Protein 8.1 6.5 - 8.1 g/dL   Albumin 4.3 3.5 - 5.0 g/dL   AST 19 15 - 41 U/L   ALT 17 0 - 44 U/L   Alkaline Phosphatase 72 38 - 126 U/L   Total Bilirubin 0.3 0.0 - 1.2 mg/dL   GFR, Estimated >39 >39 mL/min    Comment: (NOTE) Calculated using the CKD-EPI Creatinine Equation (2021)    Anion gap 7 5 - 15    Comment: Performed at [facility], [address], [place], [place] [address]  POC urine preg, ED (not at [facility])     Status: None    Collection Time: 07/27/24  9:34 PM  Result Value Ref Range   Preg Test, Ur NEGATIVE NEGATIVE    Comment:        THE SENSITIVITY OF THIS METHODOLOGY IS >20 mIU/mL.   POC CREATINE & ALBUMIN,URINE     Status: Normal   Collection Time: 08/06/24  3:32 PM  Result Value Ref Range   Microalbumin Ur, POC 30 mg/L   Creatinine, POC 300 mg/dL   Albumin/Creatinine Ratio, Urine, POC <30   CBC with Diff     Status: Abnormal   Collection Time: 08/06/24  3:38 PM  Result Value Ref Range   WBC 9.1 3.4 - 10.8 x10E3/uL   RBC 4.42 3.77 - 5.28 x10E6/uL   Hemoglobin 12.1 11.1 - 15.9 g/dL   Hematocrit 60.3 65.9 - 46.6 %   MCV 90 79 - 97 fL   MCH 27.4 26.6 - 33.0 pg   MCHC 30.6 (L) 31.5 - 35.7 g/dL   RDW 86.7 88.2 - 84.5 %   Platelets 366 150 - 450 x10E3/uL   Neutrophils 65 Not Estab. %   Lymphs 27 Not Estab. %   Monocytes 6 Not Estab. %   Eos 2 Not Estab. %   Basos 0 Not Estab. %   Neutrophils Absolute 5.8 1.4 - 7.0 x10E3/uL   Lymphocytes Absolute 2.5 0.7 - 3.1 x10E3/uL   Monocytes Absolute 0.6 0.1 - 0.9 x10E3/uL   EOS (ABSOLUTE) 0.2 0.0 - 0.4 x10E3/uL   Basophils Absolute 0.0 0.0 - 0.2 x10E3/uL   Immature Granulocytes 0 Not Estab. %   Immature Grans (Abs) 0.0 0.0 - 0.1 x10E3/uL  Iron, TIBC and Ferritin Panel     Status: Abnormal   Collection Time: 08/06/24  3:38 PM  Result Value Ref Range   Total Iron Binding Capacity 407 250 - 450 ug/dL   UIBC 635 868 - 574 ug/dL   Iron 43 27 - 840 ug/dL   Iron Saturation 11 (L) 15 - 55 %   Ferritin 11 (L) 15 - 150 ng/mL       Assessment & Plan Iron deficiency anemia due to chronic blood loss Checking labs today.  Will continue supplements as needed.   Type 2 diabetes mellitus with hyperglycemia, without long-term current use of insulin (HCC) Checking labs today. Will call pt.  With results  Continue current diabetes POC, as patient has been well controlled on current regimen.  Will adjust meds if needed based on labs.   -CBC w/Diff -CMP  w/eGFR -Hemoglobin A1C  Bipolar 1 disorder (HCC) Patient stable.  Well controlled with current therapy.   Continue current meds.   Obesity, morbid (HCC) Continue current meds.  Will adjust as needed based on results.  The patient is asked to make an attempt to improve diet and exercise patterns to aid in medical management of this problem. Addressed importance of increasing and maintaining water intake.      Return in about 1 month (around 09/05/2024).   Total time spent: 20 minutes  ALAN CHRISTELLA ARRANT, FNP  08/06/2024   This document may have been prepared by [facility] Voice Recognition software and as such may include unintentional dictation errors.

## 2024-08-08 NOTE — Assessment & Plan Note (Signed)
 Checking labs today. Will call pt. With results  Continue current diabetes POC, as patient has been well controlled on current regimen.  Will adjust meds if needed based on labs.   -CBC w/Diff -CMP w/eGFR -Hemoglobin A1C

## 2024-08-08 NOTE — Assessment & Plan Note (Signed)
 Continue current meds.  Will adjust as needed based on results.  The patient is asked to make an attempt to improve diet and exercise patterns to aid in medical management of this problem. Addressed importance of increasing and maintaining water  intake.

## 2024-08-08 NOTE — Assessment & Plan Note (Signed)
 Patient stable.  Well controlled with current therapy.   Continue current meds.

## 2024-08-08 NOTE — Assessment & Plan Note (Signed)
 Checking labs today.  Will continue supplements as needed.

## 2024-08-15 ENCOUNTER — Encounter: Payer: Self-pay | Admitting: Emergency Medicine

## 2024-08-15 ENCOUNTER — Emergency Department
Admission: EM | Admit: 2024-08-15 | Discharge: 2024-08-15 | Disposition: A | Discharge status: Home / Self Care | Attending: Emergency Medicine | Admitting: Emergency Medicine

## 2024-08-15 ENCOUNTER — Other Ambulatory Visit: Payer: Self-pay

## 2024-08-15 ENCOUNTER — Emergency Department

## 2024-08-15 DIAGNOSIS — E119 Type 2 diabetes mellitus without complications: Secondary | ICD-10-CM | POA: Diagnosis not present

## 2024-08-15 DIAGNOSIS — S99911A Unspecified injury of right ankle, initial encounter: Secondary | ICD-10-CM | POA: Diagnosis present

## 2024-08-15 DIAGNOSIS — S93401A Sprain of unspecified ligament of right ankle, initial encounter: Secondary | ICD-10-CM

## 2024-08-15 DIAGNOSIS — W108XXA Fall (on) (from) other stairs and steps, initial encounter: Secondary | ICD-10-CM | POA: Diagnosis not present

## 2024-08-15 DIAGNOSIS — S93491A Sprain of other ligament of right ankle, initial encounter: Secondary | ICD-10-CM | POA: Insufficient documentation

## 2024-08-15 MED ORDER — IBUPROFEN 600 MG PO TABS
600.0000 mg | ORAL_TABLET | Freq: Once | ORAL | Status: AC
  Administered 2024-08-15: 600 mg via ORAL
  Filled 2024-08-15: qty 1

## 2024-08-15 NOTE — Discharge Instructions (Addendum)
 You may bear weight as tolerated.  Keep the ankle elevated and apply ice.  Follow-up with orthopedics if you have persistent pain or swelling.  Return to the ER for new or worsening pain, swelling, inability to bear weight, or any other new or worsening symptoms that concern you.

## 2024-08-15 NOTE — ED Notes (Addendum)
 Pt's right ankle wrapped in ace wrap. Pt given CAM boot and crutches. Pt educated on how to properly put on boot and how to properly walk in crutches to avoid further injury.

## 2024-08-15 NOTE — ED Triage Notes (Signed)
 Pt to triage via w/c with no distress noted; st that she slipped down the steps last night injuring rt ankle; denies any other c/o or injuries

## 2024-08-15 NOTE — ED Provider Notes (Signed)
 [facility] Provider Note    Event Date/Time   First MD Initiated Contact with Patient 08/15/24 [phone]     (approximate)   History   Ankle Pain   HPI  Julie Mcclain is a 28 y.o. female with a history of bipolar disorder, anemia, diabetes who presents with a right ankle injury, acute onset yesterday evening when she slipped on the stairs and fell.  She denies any other injuries.  She reports pain mainly to the lateral part of the ankle.  She has pain on bearing weight.  I reviewed the past medical records.  The patient's most recent outpatient encounter was with internal medicine on 11/24 for follow-up of her chronic conditions.   Physical Exam   Triage Vital Signs: ED Triage Vitals  Encounter Vitals Group     BP 08/15/24 0601 107/61     Girls Systolic BP Percentile --      Girls Diastolic BP Percentile --      Boys Systolic BP Percentile --      Boys Diastolic BP Percentile --      Pulse Rate 08/15/24 0601 90     Resp 08/15/24 0601 18     Temp 08/15/24 0601 98.5 F (36.9 C)     Temp Source 08/15/24 0601 Oral     SpO2 08/15/24 0601 99 %     Weight 08/15/24 0550 269 lb (122 kg)     Height 08/15/24 0550 5' 3 (1.6 m)     Head Circumference --      Peak Flow --      Pain Score 08/15/24 0550 10     Pain Loc --      Pain Education --      Exclude from Growth Chart --     Most recent vital signs: Vitals:   08/15/24 0601  BP: 107/61  Pulse: 90  Resp: 18  Temp: 98.5 F (36.9 C)  SpO2: 99%     General: Awake, no distress.  CV:  Good peripheral perfusion.  Resp:  Normal effort.  Abd:  No distention.  Other:  Right ankle with tenderness and swelling over the lateral malleolus.  2+ DP pulse.  No Achilles tenderness.  Normal motor strength in plantar and dorsiflexion.   ED Results / Procedures / Treatments   Labs (all labs ordered are listed, but only abnormal results are displayed) Labs Reviewed - No data to  display   EKG     RADIOLOGY  XR R ankle: I independently viewed and interpreted the images; there is no acute fracture.  Radiology report confirms no evidence of fracture.  PROCEDURES:  Critical Care performed: No  Procedures   MEDICATIONS ORDERED IN ED: Medications  ibuprofen (ADVIL) tablet 600 mg (600 mg Oral Given 08/15/24 0622)     IMPRESSION / MDM / ASSESSMENT AND PLAN / ED COURSE  I reviewed the triage vital signs and the nursing notes.   Differential diagnosis includes, but is not limited to, sprain, strain, fracture.  Will obtain x-ray and reassess.  Patient's presentation is most consistent with acute complicated illness / injury requiring diagnostic workup.  ----------------------------------------- 6:58 AM on 08/15/2024 -----------------------------------------  X-rays are negative, consistent with a sprain.  The patient is stable for discharge home.  She can be weightbearing as tolerated.  We will place her in a cam boot and give crutches.  I gave strict return precautions, and the patient expressed understanding.   FINAL CLINICAL IMPRESSION(S) / ED DIAGNOSES  Final diagnoses:  Sprain of right ankle, unspecified ligament, initial encounter     Rx / DC Orders   ED Discharge Orders     None        Note:  This document was prepared using Dragon voice recognition software and may include unintentional dictation errors.    Jacolyn Pae, MD 08/15/24 [phone]

## 2024-08-22 ENCOUNTER — Encounter: Payer: Self-pay | Admitting: Oncology

## 2024-08-22 ENCOUNTER — Ambulatory Visit: Admitting: Oncology

## 2024-08-22 ENCOUNTER — Other Ambulatory Visit: Attending: Oncology

## 2024-09-04 ENCOUNTER — Ambulatory Visit: Admitting: Family

## 2024-09-10 ENCOUNTER — Ambulatory Visit: Admitting: Family

## 2024-09-10 DIAGNOSIS — N926 Irregular menstruation, unspecified: Secondary | ICD-10-CM | POA: Diagnosis not present

## 2024-09-10 DIAGNOSIS — R5383 Other fatigue: Secondary | ICD-10-CM | POA: Diagnosis not present

## 2024-09-10 DIAGNOSIS — Z0130 Encounter for examination of blood pressure without abnormal findings: Secondary | ICD-10-CM

## 2024-09-10 DIAGNOSIS — D500 Iron deficiency anemia secondary to blood loss (chronic): Secondary | ICD-10-CM

## 2024-09-10 DIAGNOSIS — E782 Mixed hyperlipidemia: Secondary | ICD-10-CM

## 2024-09-10 DIAGNOSIS — E1165 Type 2 diabetes mellitus with hyperglycemia: Secondary | ICD-10-CM | POA: Diagnosis not present

## 2024-09-10 DIAGNOSIS — E559 Vitamin D deficiency, unspecified: Secondary | ICD-10-CM

## 2024-09-10 DIAGNOSIS — E538 Deficiency of other specified B group vitamins: Secondary | ICD-10-CM | POA: Diagnosis not present

## 2024-09-10 DIAGNOSIS — F319 Bipolar disorder, unspecified: Secondary | ICD-10-CM

## 2024-09-10 MED ORDER — VITAMIN D (ERGOCALCIFEROL) 1.25 MG (50000 UNIT) PO CAPS: 50000.0000 [IU] | ORAL_CAPSULE | ORAL | 3 refills | Status: DC

## 2024-09-10 MED ORDER — ROSUVASTATIN CALCIUM 5 MG PO TABS: 5.0000 mg | ORAL_TABLET | Freq: Every day | ORAL | 2 refills | Status: DC

## 2024-09-10 MED ORDER — NORETHIN ACE-ETH ESTRAD-FE 1-20 MG-MCG PO TABS: 1.0000 | ORAL_TABLET | Freq: Every day | ORAL | 1 refills | Status: AC

## 2024-09-10 NOTE — Assessment & Plan Note (Signed)
 Checking labs today.  Continue current therapy for lipid control. Will modify as needed based on labwork results.   -CMP w/eGFR -Lipid Panel

## 2024-09-10 NOTE — Assessment & Plan Note (Signed)
 Checking labs today.  Will continue supplements as needed.   - Vitamin D  - Vitamin B12 - TSH - Iron, TIBC, Ferritin

## 2024-09-10 NOTE — Assessment & Plan Note (Signed)
 Patient stable.  Well controlled with current therapy.   Continue current meds.

## 2024-09-10 NOTE — Progress Notes (Signed)
 "  Established Patient Office Visit  Subjective:  Patient ID: Julie Mcclain, female    DOB: May 28, 1996  Age: 28 y.o. MRN: [id number]  Chief Complaint  Patient presents with   Follow-up    1 month follow up    Patient is here today for her 1 months follow up.  She has been feeling fairly well since last appointment.   She does have additional concerns to discuss today.  She asks about the results of her labs, which were mostly normal.  She also asks if we have any recommendations for managing her weight with diet.   Normal labs are due today.  She needs refills.   I have reviewed her active problem list, medication list, allergies, health maintenance, notes from last encounter, lab results for her appointment today.     No other concerns at this time.   Past Medical History:  Diagnosis Date   Abnormal urinalysis 07/27/2022   Formatting of this note might be different from the original. Last Assessment & Plan: Formatting of this note might be different from the original. Patient got a dose of Rocephin  in the ED Will hold off on further antibiotics pending culture     Absolute anemia 07/27/2022   Formatting of this note might be different from the original. Last Assessment & Plan: Formatting of this note might be different from the original. 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 Suspect gastritis History of iron deficiency anemia History of menometrorrhagia Reports food-related epigastric pain consistent with gastritis Continue transfusion of 1 unit PRBCs started in the ED IV Protonix  GI consu   Generalized weakness 10/06/2021   History of trichomonal vaginitis 08/29/2023   IDA (iron deficiency anemia)    Menometrorrhagia    Obesity    Ovarian cyst    Snoring    Supervision of high risk pregnancy, antepartum 01/06/2022    Past Surgical History:  Procedure Laterality Date   NO PAST SURGERIES      Social History   Socioeconomic History   Marital status: Single     Spouse name: Not on file   Number of children: 0   Years of education: 12   Highest education level: Not on file  Occupational History   Occupation: unemployed  Tobacco Use   Smoking status: Never    Passive exposure: Never   Smokeless tobacco: Never  Vaping Use   Vaping status: Never Used  Substance and Sexual Activity   Alcohol use: No   Drug use: No   Sexual activity: Not Currently    Birth control/protection: Pill  Other Topics Concern   Not on file  Social History Narrative   Not on file   Social Drivers of Health   Tobacco Use: Low Risk  (08/30/2024)   Received from [facility]   Patient History    Smoking Tobacco Use: Never    Smokeless Tobacco Use: Never    Passive Exposure: Not on file  Financial Resource Strain: Low Risk (01/06/2022)   Overall Financial Resource Strain (CARDIA)    Difficulty of Paying Living Expenses: Not hard at all  Food Insecurity: No Food Insecurity (06/22/2023)   Hunger Vital Sign    Worried About Running Out of Food in the Last Year: Never true    Ran Out of Food in the Last Year: Never true  Transportation Needs: No Transportation Needs (06/22/2023)   PRAPARE - Administrator, Civil Service (Medical): No    Lack of Transportation (Non-Medical): No  Physical  Activity: Insufficiently Active (01/06/2022)   Exercise Vital Sign    Days of Exercise per Week: 1 day    Minutes of Exercise per Session: 30 min  Stress: No Stress Concern Present (01/06/2022)   [employer] of Occupational Health - Occupational Stress Questionnaire    Feeling of Stress : Only a little  Social Connections: Moderately Isolated (01/06/2022)   Social Connection and Isolation Panel    Frequency of Communication with Friends and Family: More than three times a week    Frequency of Social Gatherings with Friends and Family: More than three times a week    Attends Religious Services: More than 4 times per year    Active Member of [employer] or Organizations: No     Attends Banker Meetings: Never    Marital Status: Never married  Intimate Partner Violence: Not At Risk (06/22/2023)   Humiliation, Afraid, Rape, and Kick questionnaire    Fear of Current or Ex-Partner: No    Emotionally Abused: No    Physically Abused: No    Sexually Abused: No  Depression (PHQ2-9): Low Risk (11/11/2023)   Depression (PHQ2-9)    PHQ-2 Score: 0  Alcohol Screen: Not on file  Housing: Low Risk (06/22/2023)   Housing    Last Housing Risk Score: 0  Utilities: Not At Risk (06/22/2023)   AHC Utilities    Threatened with loss of utilities: No  Health Literacy: Not on file    Family History  Problem Relation Age of Onset   Diabetes Father        blind in one eye    Allergies[1]  Review of Systems  All other systems reviewed and are negative.      Objective:   BP 120/76   Pulse 96   Ht 5' 4 (1.626 m)   Wt 268 lb 9.6 oz (121.8 kg)   LMP 07/14/2024 (Approximate)   SpO2 97%   BMI 46.11 kg/m   Vitals:   09/10/24 1520  BP: 120/76  Pulse: 96  Height: 5' 4 (1.626 m)  Weight: 268 lb 9.6 oz (121.8 kg)  SpO2: 97%  BMI (Calculated): 46.08    Physical Exam Vitals and nursing note reviewed.  Constitutional:      Appearance: Normal appearance. She is normal weight.  HENT:     Head: Normocephalic.  Eyes:     Extraocular Movements: Extraocular movements intact.     Conjunctiva/sclera: Conjunctivae normal.     Pupils: Pupils are equal, round, and reactive to light.  Cardiovascular:     Rate and Rhythm: Normal rate and regular rhythm.  Pulmonary:     Effort: Pulmonary effort is normal.  Musculoskeletal:     Cervical back: Normal range of motion.  Neurological:     General: No focal deficit present.     Mental Status: She is alert and oriented to person, place, and time. Mental status is at baseline.  Psychiatric:        Mood and Affect: Mood normal.        Behavior: Behavior normal.        Thought Content: Thought content normal.         Judgment: Judgment normal.      No results found for any visits on 09/10/24.  Recent Results (from the past 2160 hours)  Cervicovaginal ancillary only     Status: Abnormal   Collection Time: 06/13/24  9:48 AM  Result Value Ref Range   Neisseria Gonorrhea Negative    Chlamydia  Negative    Trichomonas Negative    Bacterial Vaginitis (gardnerella) Positive (A)    Candida Vaginitis Negative    Candida Glabrata Negative    Comment Normal Reference Range Candida Species - Negative    Comment Normal Reference Range Candida Galbrata - Negative    Comment Normal Reference Range Trichomonas - Negative    Comment      Normal Reference Range Bacterial Vaginosis - Negative   Comment Normal Reference Ranger Chlamydia - Negative    Comment      Normal Reference Range Neisseria Gonorrhea - Negative  HIV antibody (with reflex)     Status: None   Collection Time: 06/13/24 10:01 AM  Result Value Ref Range   HIV Screen 4th Generation wRfx Non Reactive Non Reactive    Comment: HIV-1/HIV-2 antibodies and HIV-1 p24 antigen were NOT detected. There is no laboratory evidence of HIV infection. HIV Negative   RPR     Status: Abnormal   Collection Time: 06/13/24 10:01 AM  Result Value Ref Range   RPR Ser Ql Reactive (A) Non Reactive  Hepatitis B surface antigen     Status: None   Collection Time: 06/13/24 10:01 AM  Result Value Ref Range   Hepatitis B Surface Ag Negative Negative  Hepatitis C Antibody     Status: None   Collection Time: 06/13/24 10:01 AM  Result Value Ref Range   Hep C Virus Ab Non Reactive Non Reactive    Comment: HCV antibody alone does not differentiate between previously resolved infection and active infection. Equivocal and Reactive HCV antibody results should be followed up with an HCV RNA test to support the diagnosis of active HCV infection.   RPR, quant & T.pallidum antibodies     Status: Abnormal   Collection Time: 06/13/24 10:01 AM  Result Value Ref Range   Rapid  Plasma Reagin, Quant 1:4 (H) NonRea<1:1 titer   T Pallidum Abs Reactive (A) Non Reactive   Interpretation: Comment     Comment: Syphilis: RPR with Reflex to RPR Titer and Treponemal           Antibodies, Traditional Screening and Diagnosis           Algorithm ------------------------------------------------------------                        Treponemal RPR        RPR, Qn         Ab       Final Interpretation --------   ---------   ----------   ------------------------ Non        N/A         N/A          No laboratory evidence Reactive                            of syphilis. Retest in                                     2-4 weeks if recent                                     exposure is suspected. --------   ---------   ----------   ------------------------ Reactive   >/=1:1  Non          Nontreponemal antibodies                        Reactive     detected. Syphilis                                     unlikely; biological                                     false positive possible.                                     Retest in 2-4 weeks if                                     recent exposure  is                                     suspected. --------   ---------   ----------   ------------------------ Reactive   >/=1:1      Reactive     Treponemal and                                     nontreponemal antibodies                                     detected. Consistent                                     with past or current                                     (potential early)                                     syphilis.   CBC     Status: Abnormal   Collection Time: 07/27/24  9:16 PM  Result Value Ref Range   WBC 8.7 4.0 - 10.5 K/uL   RBC 4.33 3.87 - 5.11 MIL/uL   Hemoglobin 11.8 (L) 12.0 - 15.0 g/dL   HCT 61.3 63.9 - 53.9 %   MCV 89.1 80.0 - 100.0 fL   MCH 27.3 26.0 - 34.0 pg   MCHC 30.6 30.0 - 36.0 g/dL   RDW 85.8 88.4 - 84.4 %   Platelets 431 (H) 150 - 400 K/uL   nRBC  0.0 0.0 - 0.2 %    Comment: Performed at [facility], [address], [place], [place] [address]  Comprehensive metabolic panel     Status: Abnormal   Collection Time: 07/27/24  9:16 PM  Result Value Ref Range  Sodium 142 135 - 145 mmol/L   Potassium 4.1 3.5 - 5.1 mmol/L   Chloride 108 98 - 111 mmol/L   CO2 27 22 - 32 mmol/L   Glucose, Bld 129 (H) 70 - 99 mg/dL    Comment: Glucose reference range applies only to samples taken after fasting for at least 8 hours.   BUN 14 6 - 20 mg/dL   Creatinine, Ser 9.14 0.44 - 1.00 mg/dL   Calcium  9.6 8.9 - 10.3 mg/dL   Total Protein 8.1 6.5 - 8.1 g/dL   Albumin 4.3 3.5 - 5.0 g/dL   AST 19 15 - 41 U/L   ALT 17 0 - 44 U/L   Alkaline Phosphatase 72 38 - 126 U/L   Total Bilirubin 0.3 0.0 - 1.2 mg/dL   GFR, Estimated >39 >39 mL/min    Comment: (NOTE) Calculated using the CKD-EPI Creatinine Equation (2021)    Anion gap 7 5 - 15    Comment: Performed at [facility], [address], [place], [place] [address]  POC urine preg, ED (not at [facility])     Status: None   Collection Time: 07/27/24  9:34 PM  Result Value Ref Range   Preg Test, Ur NEGATIVE NEGATIVE    Comment:        THE SENSITIVITY OF THIS METHODOLOGY IS >20 mIU/mL.   POC CREATINE & ALBUMIN,URINE     Status: Normal   Collection Time: 08/06/24  3:32 PM  Result Value Ref Range   Microalbumin Ur, POC 30 mg/L   Creatinine, POC 300 mg/dL   Albumin/Creatinine Ratio, Urine, POC <30   CBC with Diff     Status: Abnormal   Collection Time: 08/06/24  3:38 PM  Result Value Ref Range   WBC 9.1 3.4 - 10.8 x10E3/uL   RBC 4.42 3.77 - 5.28 x10E6/uL   Hemoglobin 12.1 11.1 - 15.9 g/dL   Hematocrit 60.3 65.9 - 46.6 %   MCV 90 79 - 97 fL   MCH 27.4 26.6 - 33.0 pg   MCHC 30.6 (L) 31.5 - 35.7 g/dL   RDW 86.7 88.2 - 84.5 %   Platelets 366 150 - 450 x10E3/uL   Neutrophils 65 Not Estab. %   Lymphs 27 Not Estab. %   Monocytes 6 Not Estab. %   Eos 2 Not Estab. %   Basos 0 Not Estab.  %   Neutrophils Absolute 5.8 1.4 - 7.0 x10E3/uL   Lymphocytes Absolute 2.5 0.7 - 3.1 x10E3/uL   Monocytes Absolute 0.6 0.1 - 0.9 x10E3/uL   EOS (ABSOLUTE) 0.2 0.0 - 0.4 x10E3/uL   Basophils Absolute 0.0 0.0 - 0.2 x10E3/uL   Immature Granulocytes 0 Not Estab. %   Immature Grans (Abs) 0.0 0.0 - 0.1 x10E3/uL  Iron, TIBC and Ferritin Panel     Status: Abnormal   Collection Time: 08/06/24  3:38 PM  Result Value Ref Range   Total Iron Binding Capacity 407 250 - 450 ug/dL   UIBC 635 868 - 574 ug/dL   Iron 43 27 - 840 ug/dL   Iron Saturation 11 (L) 15 - 55 %   Ferritin 11 (L) 15 - 150 ng/mL       Assessment & Plan Irregular menses Patient stable.  Well controlled with current therapy.   Continue current meds.   Vitamin D  deficiency, unspecified B12 deficiency due to diet Other fatigue Iron deficiency anemia due to chronic blood loss Checking labs today.  Will continue supplements as needed.   -  Vitamin D  - Vitamin B12 - TSH - Iron, TIBC, Ferritin  Mixed hyperlipidemia Checking labs today.  Continue current therapy for lipid control. Will modify as needed based on labwork results.   -CMP w/eGFR -Lipid Panel  Bipolar 1 disorder (HCC) Patient stable.  Well controlled with current therapy.   Continue current meds.   Type 2 diabetes mellitus with hyperglycemia, without long-term current use of insulin (HCC) Checking labs today. Will call pt. With results  Continue current diabetes POC, as patient has been well controlled on current regimen.  Will adjust meds if needed based on labs.   -CBC w/Diff -CMP w/eGFR -Hemoglobin A1C    Return in about 1 month (around 10/11/2024) for F/U.   Total time spent: 20 minutes  ALAN CHRISTELLA ARRANT, FNP  09/10/2024   This document may have been prepared by [facility] Voice Recognition software and as such may include unintentional dictation errors.       [1] No Known Allergies  "

## 2024-09-10 NOTE — Assessment & Plan Note (Signed)
 Checking labs today. Will call pt. With results  Continue current diabetes POC, as patient has been well controlled on current regimen.  Will adjust meds if needed based on labs.   -CBC w/Diff -CMP w/eGFR -Hemoglobin A1C

## 2024-10-08 NOTE — Progress Notes (Signed)
 Julie Mcclain                                          MRN: [id number]   10/08/2024   The [facility] Quality Team Specialist reviewed this patient medical record for the purposes of chart review for care gap closure. The following were reviewed: abstraction for care gap closure-glycemic status assessment.    [facility] Quality Team

## 2024-10-12 ENCOUNTER — Ambulatory Visit: Admitting: Family

## 2024-10-12 ENCOUNTER — Encounter: Payer: Self-pay | Admitting: Family

## 2024-10-12 DIAGNOSIS — E1165 Type 2 diabetes mellitus with hyperglycemia: Secondary | ICD-10-CM

## 2024-10-12 DIAGNOSIS — E66813 Obesity, class 3: Secondary | ICD-10-CM

## 2024-10-12 DIAGNOSIS — E559 Vitamin D deficiency, unspecified: Secondary | ICD-10-CM

## 2024-10-12 MED ORDER — ROSUVASTATIN CALCIUM 5 MG PO TABS: 5.0000 mg | ORAL_TABLET | Freq: Every day | ORAL | 2 refills | Status: AC

## 2024-10-12 MED ORDER — VITAMIN D (ERGOCALCIFEROL) 1.25 MG (50000 UNIT) PO CAPS: 50000.0000 [IU] | ORAL_CAPSULE | ORAL | 3 refills | Status: AC

## 2024-10-12 MED ORDER — ZEPBOUND 5 MG/0.5ML ~~LOC~~ SOAJ: 5.0000 mg | SUBCUTANEOUS | 1 refills | Status: AC

## 2024-11-14 ENCOUNTER — Ambulatory Visit: Admitting: Family
